# Patient Record
Sex: Female | Born: 1955 | Race: White | Hispanic: No | Smoking: Former smoker
Health system: Southern US, Community
[De-identification: ages and names within clinical notes are randomized; demographics above are authoritative.]

## PROBLEM LIST (undated history)

## (undated) DIAGNOSIS — R5383 Other fatigue: Secondary | ICD-10-CM

## (undated) DIAGNOSIS — E119 Type 2 diabetes mellitus without complications: Secondary | ICD-10-CM

## (undated) DIAGNOSIS — M2550 Pain in unspecified joint: Secondary | ICD-10-CM

## (undated) DIAGNOSIS — F329 Major depressive disorder, single episode, unspecified: Secondary | ICD-10-CM

## (undated) DIAGNOSIS — R0602 Shortness of breath: Secondary | ICD-10-CM

## (undated) DIAGNOSIS — F32A Depression, unspecified: Secondary | ICD-10-CM

## (undated) DIAGNOSIS — F419 Anxiety disorder, unspecified: Secondary | ICD-10-CM

## (undated) DIAGNOSIS — M79606 Pain in leg, unspecified: Secondary | ICD-10-CM

## (undated) DIAGNOSIS — K760 Fatty (change of) liver, not elsewhere classified: Secondary | ICD-10-CM

## (undated) DIAGNOSIS — Z780 Asymptomatic menopausal state: Secondary | ICD-10-CM

## (undated) HISTORY — DX: Depression, unspecified: F32.A

## (undated) HISTORY — DX: Type 2 diabetes mellitus without complications: E11.9

## (undated) HISTORY — DX: Anxiety disorder, unspecified: F41.9

## (undated) HISTORY — DX: Fatty (change of) liver, not elsewhere classified: K76.0

## (undated) HISTORY — DX: Pain in unspecified joint: M25.50

## (undated) HISTORY — DX: Shortness of breath: R06.02

## (undated) HISTORY — DX: Other fatigue: R53.83

## (undated) HISTORY — DX: Major depressive disorder, single episode, unspecified: F32.9

## (undated) HISTORY — DX: Asymptomatic menopausal state: Z78.0

## (undated) HISTORY — DX: Pain in leg, unspecified: M79.606

---
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
## 1991-01-03 HISTORY — PX: TUBAL LIGATION: SHX77

## 2000-09-06 ENCOUNTER — Other Ambulatory Visit: Admission: RE | Admit: 2000-09-06 | Discharge: 2000-09-06 | Payer: Self-pay | Admitting: Family Medicine

## 2003-04-14 ENCOUNTER — Ambulatory Visit (HOSPITAL_COMMUNITY): Admission: RE | Admit: 2003-04-14 | Discharge: 2003-04-14 | Payer: Self-pay | Admitting: Obstetrics & Gynecology

## 2003-07-17 ENCOUNTER — Encounter: Payer: Self-pay | Admitting: Family Medicine

## 2004-06-21 ENCOUNTER — Ambulatory Visit: Payer: Self-pay | Admitting: Family Medicine

## 2004-12-27 ENCOUNTER — Ambulatory Visit: Payer: Self-pay | Admitting: Internal Medicine

## 2005-05-11 IMAGING — US US PELVIS COMPLETE MODIFY
1 series · 14 of 25 positions shown · non-contrast
Comparison: none

CLINICAL DATA: Menorrhagia.  No period from [place] until two weeks ago.  No hormonal replacement therapy.  
TRANSABDOMINAL AND TRANSVAGINAL PELVIC ULTRASOUND 
Transabdominal and endovaginal scanning of the pelvis was performed.  The uterus is enlarged measuring 11.1 cm in length, 6.9 cm in depth, and 7.2 cm in width.  The myometrium is inhomogeneous without measurable fibroids.  The endometrium is indistinct.  The best estimate of a measurement is approximately 9.1 mm.  This raises the question of adenomyosis.
Ovaries are difficult to visualize. What is felt likely to represent the ovary bilaterally is normal on transabdominal scanning.  No adnexal mass or free pelvic fluid is noted.
IMPRESSION
Mildly enlarged inhomogeneous uterus with indistinct endometrium.  Findings would raise a question of adenomyosis.  Pelvic MRI may be helpful for further evaluation if clinically indicated.  
Ovaries difficult to visualize, but felt to be within normal limits on transabdominal scan.

[Series 1: unknown · 0.37mm/px · 14 of 59 slices shown]
[im 1/59]
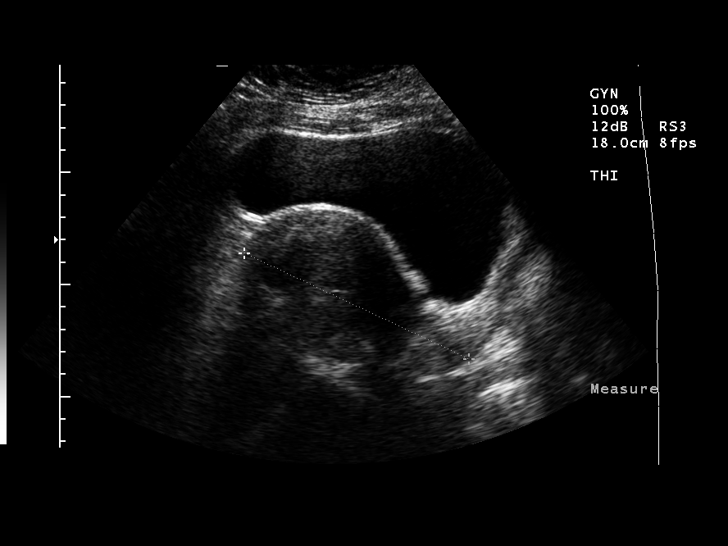
[im 5/59]
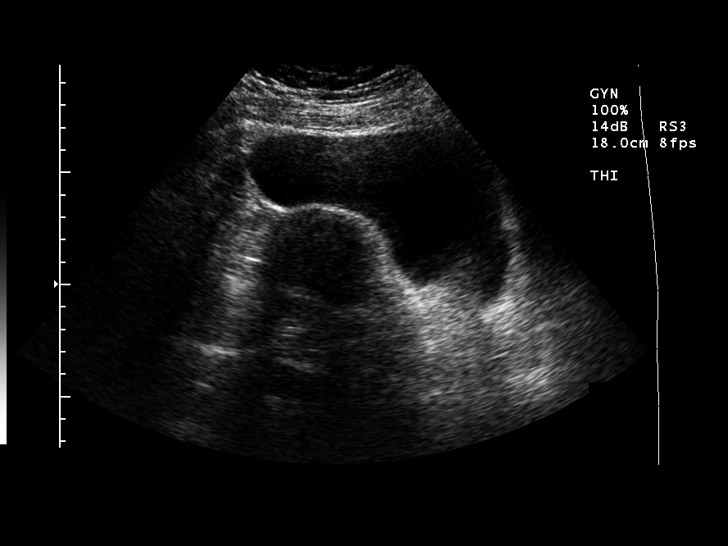
[im 10/59]
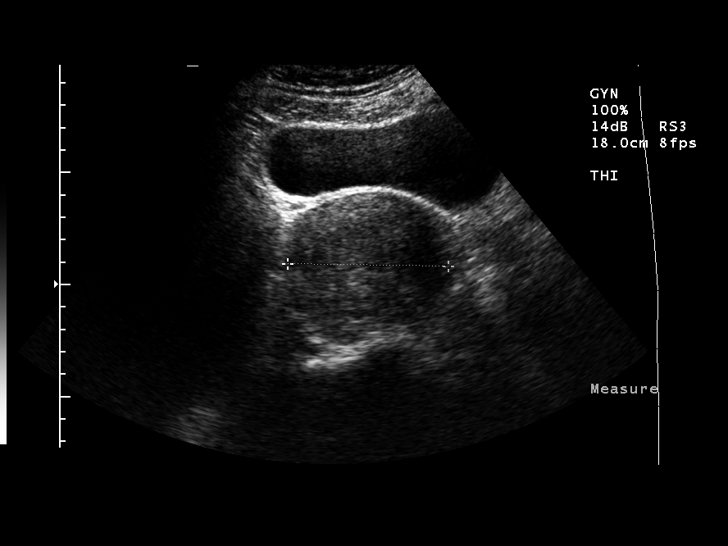
[im 15/59]
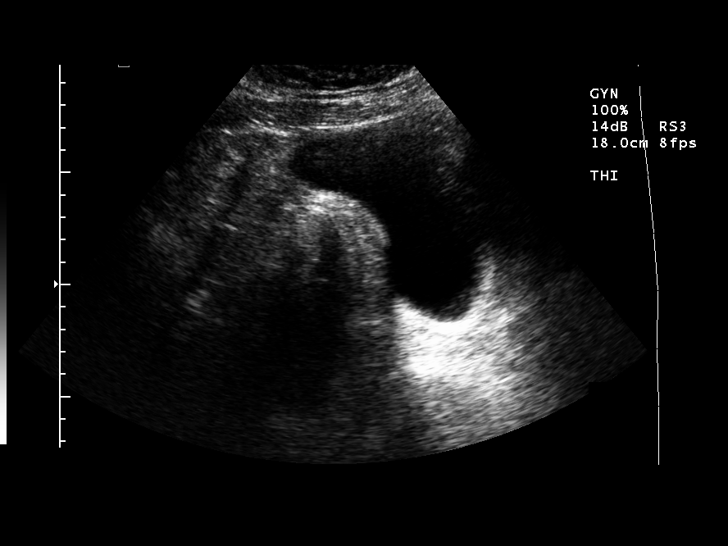
[im 20/59]
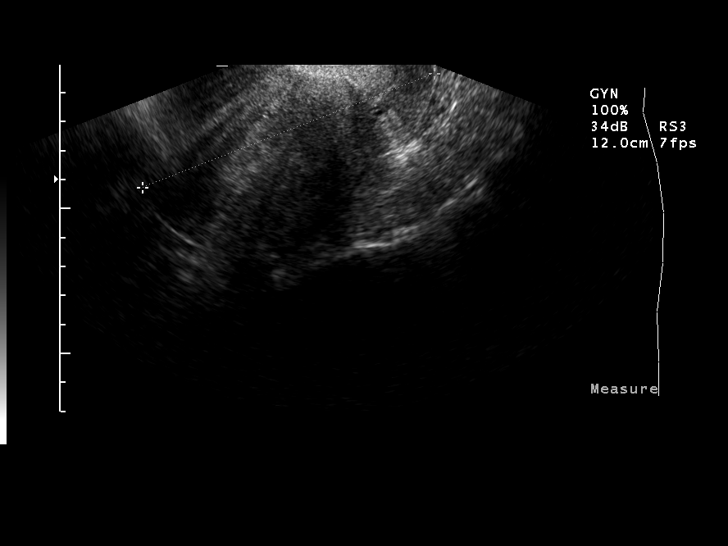
[im 22/59]
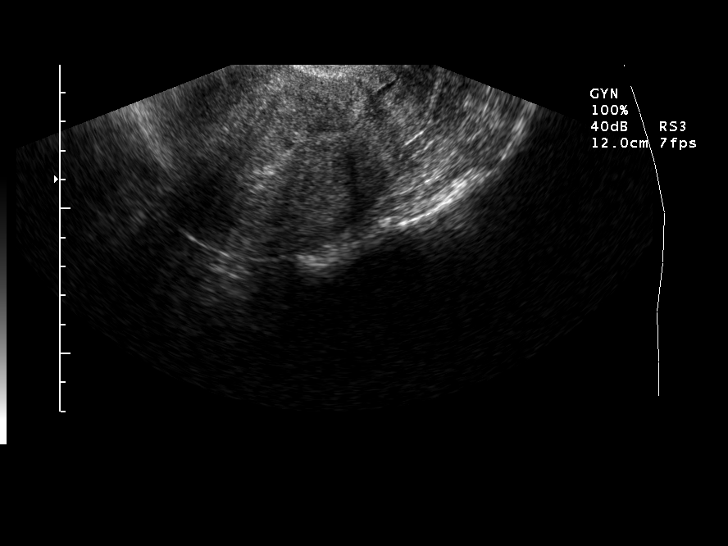
[im 27/59]
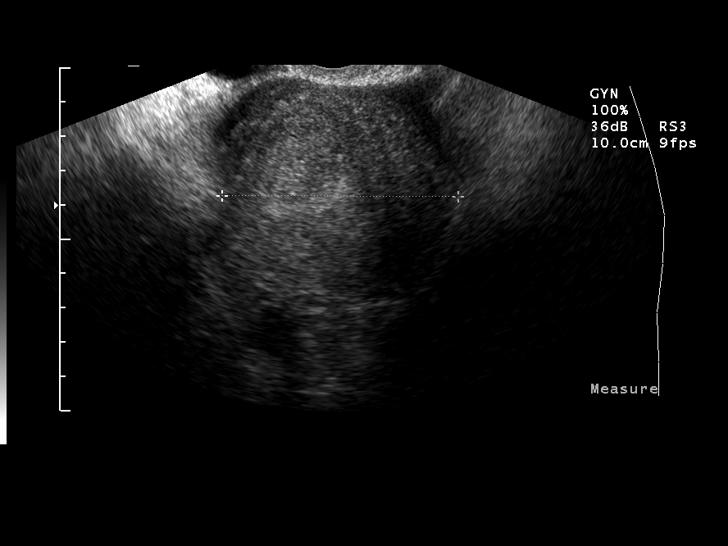
[im 32/59]
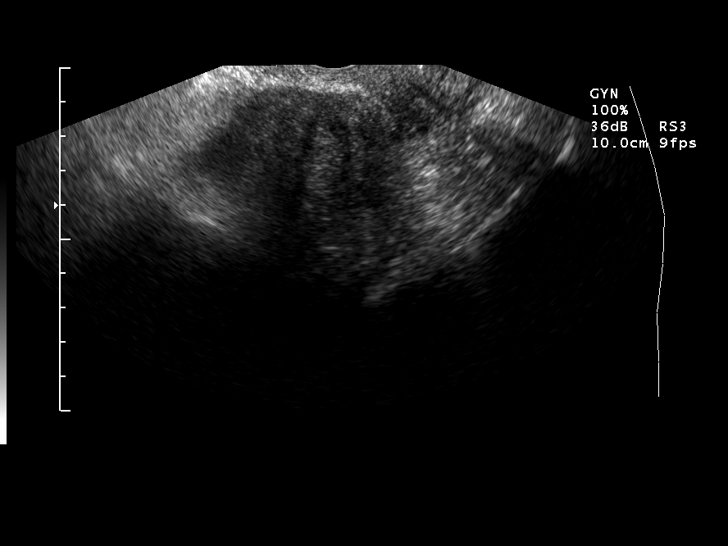
[im 37/59]
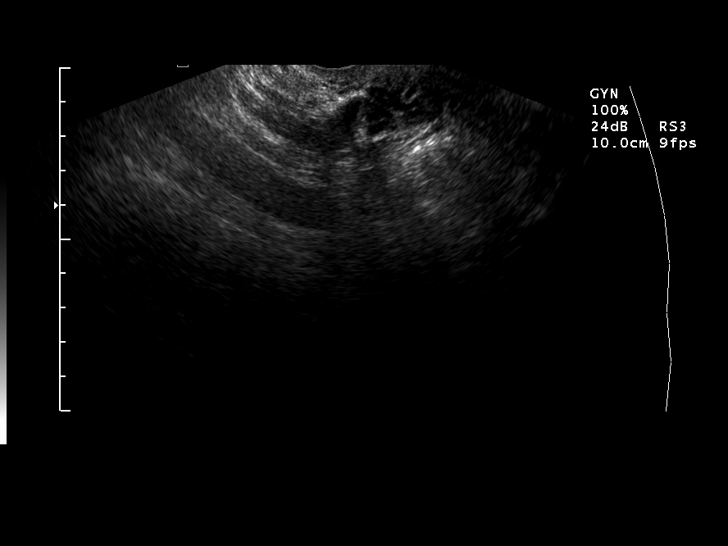
[im 39/59]
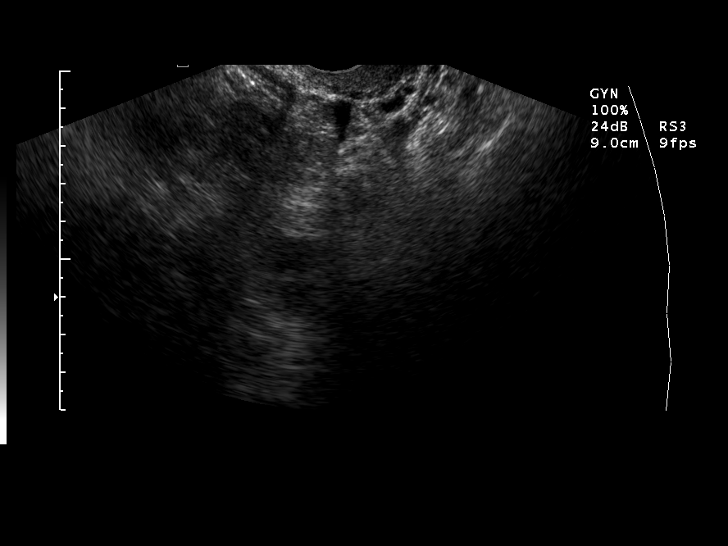
[im 44/59]
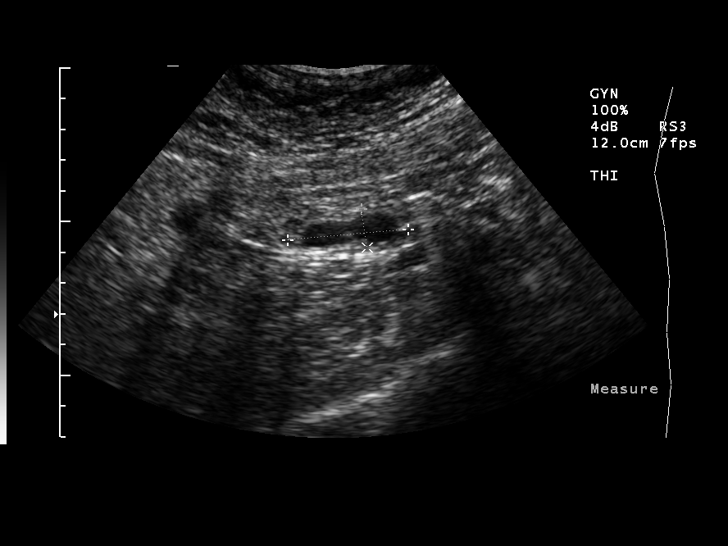
[im 49/59]
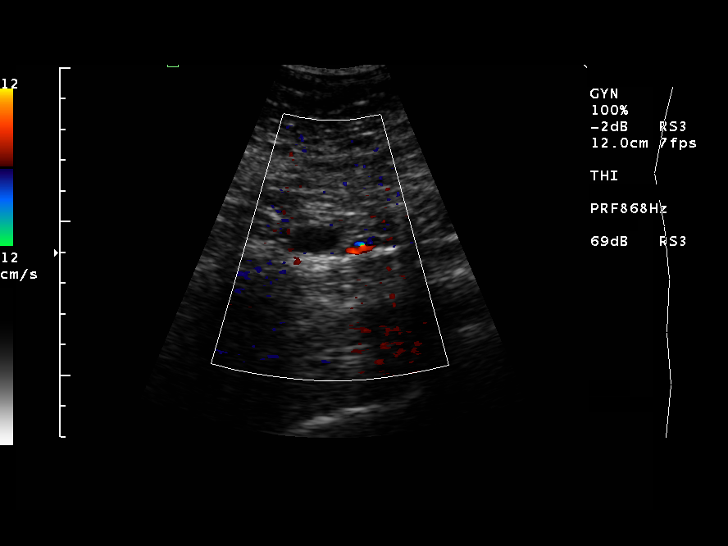
[im 54/59]
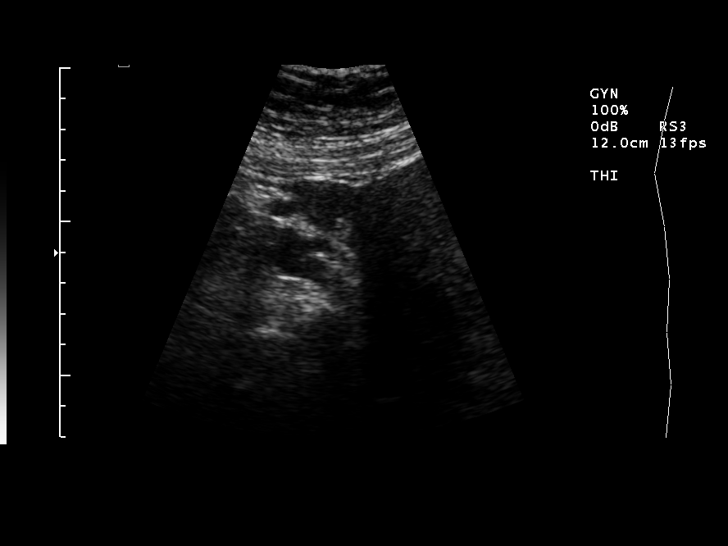
[im 59/59]
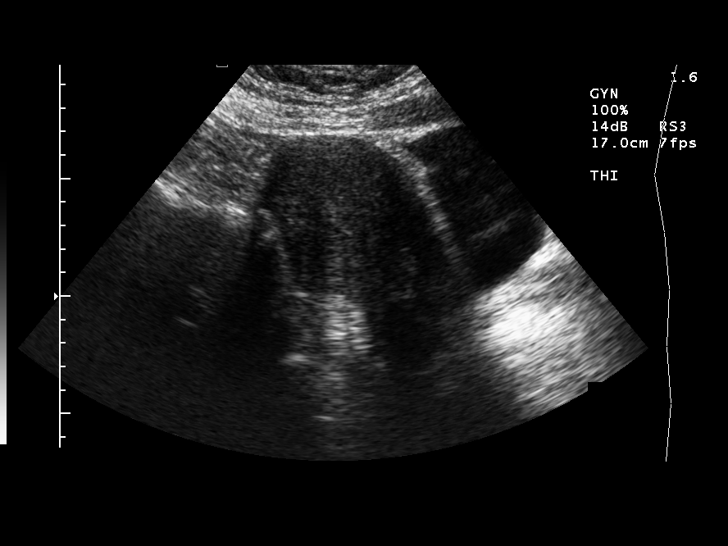

[14 of 25 positions shown; findings below may reference images not displayed]

## 2006-01-18 ENCOUNTER — Ambulatory Visit: Payer: Self-pay | Admitting: Family Medicine

## 2006-03-28 ENCOUNTER — Encounter: Payer: Self-pay | Admitting: Family Medicine

## 2006-03-28 ENCOUNTER — Ambulatory Visit: Payer: Self-pay | Admitting: Family Medicine

## 2006-03-28 ENCOUNTER — Other Ambulatory Visit: Admission: RE | Admit: 2006-03-28 | Discharge: 2006-03-28 | Payer: Self-pay | Admitting: Family Medicine

## 2006-03-28 LAB — CONVERTED CEMR LAB
ALT: 12 units/L (ref 0–40)
Basophils Absolute: 0 10*3/uL (ref 0.0–0.1)
Basophils Relative: 0.3 % (ref 0.0–1.0)
Bilirubin, Direct: 0.1 mg/dL (ref 0.0–0.3)
Calcium: 9 mg/dL (ref 8.4–10.5)
Chloride: 106 meq/L (ref 96–112)
Cholesterol: 184 mg/dL (ref 0–200)
Eosinophils Absolute: 0.1 10*3/uL (ref 0.0–0.6)
Estradiol: 103.5 pg/mL
GFR calc Af Amer: 136 mL/min
GFR calc non Af Amer: 112 mL/min
Glucose, Bld: 97 mg/dL (ref 70–99)
HDL: 38.1 mg/dL — ABNORMAL LOW (ref >39.0)
LH: 15.4 milliintl units/mL
Lymphocytes Relative: 29.8 % (ref 12.0–46.0)
MCHC: 35 g/dL (ref 30.0–36.0)
MCV: 89.9 fL (ref 78.0–100.0)
Monocytes Relative: 4.7 % (ref 3.0–11.0)
Neutro Abs: 7.6 10*3/uL (ref 1.4–7.7)
Sodium: 143 meq/L (ref 135–145)
TSH: 0.75 microintl units/mL (ref 0.35–5.50)
Total Protein: 6.4 g/dL (ref 6.0–8.3)
WBC: 11.8 10*3/uL — ABNORMAL HIGH (ref 4.5–10.5)

## 2006-04-11 ENCOUNTER — Ambulatory Visit (HOSPITAL_COMMUNITY): Admission: RE | Admit: 2006-04-11 | Discharge: 2006-04-11 | Payer: Self-pay | Admitting: Family Medicine

## 2006-06-03 ENCOUNTER — Encounter: Payer: Self-pay | Admitting: Family Medicine

## 2006-06-28 ENCOUNTER — Encounter: Payer: Self-pay | Admitting: Family Medicine

## 2007-08-12 ENCOUNTER — Ambulatory Visit: Payer: Self-pay | Admitting: Family Medicine

## 2007-08-12 DIAGNOSIS — Z87891 Personal history of nicotine dependence: Secondary | ICD-10-CM

## 2007-09-11 ENCOUNTER — Telehealth (INDEPENDENT_AMBULATORY_CARE_PROVIDER_SITE_OTHER): Payer: Self-pay | Admitting: *Deleted

## 2007-10-28 ENCOUNTER — Encounter: Payer: Self-pay | Admitting: Family Medicine

## 2007-10-29 ENCOUNTER — Other Ambulatory Visit: Admission: RE | Admit: 2007-10-29 | Discharge: 2007-10-29 | Payer: Self-pay | Admitting: Family Medicine

## 2007-10-29 ENCOUNTER — Encounter: Payer: Self-pay | Admitting: Family Medicine

## 2007-10-29 ENCOUNTER — Ambulatory Visit: Payer: Self-pay | Admitting: Family Medicine

## 2007-10-29 DIAGNOSIS — Z780 Asymptomatic menopausal state: Secondary | ICD-10-CM | POA: Insufficient documentation

## 2007-11-03 LAB — CONVERTED CEMR LAB
ALT: 14 units/L (ref 0–35)
Albumin: 4.4 g/dL (ref 3.5–5.2)
Alkaline Phosphatase: 81 units/L (ref 39–117)
Basophils Relative: 1.3 % (ref 0.0–3.0)
Bilirubin, Direct: 0.1 mg/dL (ref 0.0–0.3)
CO2: 31 meq/L (ref 19–32)
Chloride: 106 meq/L (ref 96–112)
Creatinine, Ser: 0.7 mg/dL (ref 0.4–1.2)
Eosinophils Absolute: 0.1 10*3/uL (ref 0.0–0.7)
HCT: 40.6 % (ref 36.0–46.0)
Hemoglobin: 14 g/dL (ref 12.0–15.0)
MCHC: 34.5 g/dL (ref 30.0–36.0)
MCV: 92.4 fL (ref 78.0–100.0)
Monocytes Absolute: 1.9 10*3/uL — ABNORMAL HIGH (ref 0.1–1.0)
Neutro Abs: 1.8 10*3/uL (ref 1.4–7.7)
RBC: 4.39 M/uL (ref 3.87–5.11)
RDW: 12.5 % (ref 11.5–14.6)
Sodium: 144 meq/L (ref 135–145)
Total Bilirubin: 0.6 mg/dL (ref 0.3–1.2)
Total Protein: 7.4 g/dL (ref 6.0–8.3)
WBC: 9.5 10*3/uL (ref 4.5–10.5)

## 2007-11-04 ENCOUNTER — Encounter (INDEPENDENT_AMBULATORY_CARE_PROVIDER_SITE_OTHER): Payer: Self-pay | Admitting: *Deleted

## 2008-08-14 ENCOUNTER — Ambulatory Visit: Payer: Self-pay | Admitting: Gastroenterology

## 2008-09-01 ENCOUNTER — Ambulatory Visit: Payer: Self-pay | Admitting: Gastroenterology

## 2008-09-01 ENCOUNTER — Encounter: Payer: Self-pay | Admitting: Gastroenterology

## 2008-09-03 ENCOUNTER — Encounter: Payer: Self-pay | Admitting: Gastroenterology

## 2010-01-23 ENCOUNTER — Encounter: Payer: Self-pay | Admitting: Family Medicine

## 2010-04-08 ENCOUNTER — Encounter: Payer: Self-pay | Admitting: Family Medicine

## 2010-04-12 ENCOUNTER — Ambulatory Visit (INDEPENDENT_AMBULATORY_CARE_PROVIDER_SITE_OTHER): Payer: Managed Care, Other (non HMO) | Admitting: Family Medicine

## 2010-04-12 ENCOUNTER — Encounter: Payer: Self-pay | Admitting: Family Medicine

## 2010-04-12 DIAGNOSIS — E1165 Type 2 diabetes mellitus with hyperglycemia: Secondary | ICD-10-CM | POA: Insufficient documentation

## 2010-04-12 DIAGNOSIS — Z87891 Personal history of nicotine dependence: Secondary | ICD-10-CM

## 2010-04-12 DIAGNOSIS — E119 Type 2 diabetes mellitus without complications: Secondary | ICD-10-CM

## 2010-04-12 MED ORDER — VARENICLINE TARTRATE 0.5 MG X 11 & 1 MG X 42 PO MISC: ORAL | Status: DC

## 2010-04-12 NOTE — Assessment & Plan Note (Signed)
Refer to nutritionist Check BS periodically  Recheck labs 3 months

## 2010-04-12 NOTE — Progress Notes (Signed)
  Subjective:    Patient ID: Vickie Nichols, female    DOB: 02-26-1955, 55 y.o.   MRN: [id number]  HPI Pt here to review labs from Dr Renaldo Fiddler.  Pt is concerned about diabetes.  She is not currently doing anything with diet or exercise.  Pt also wants Chantix to quit smoking.      Review of Systems As above    Objective:   Physical Exam  Constitutional: She appears well-developed and well-nourished.  Skin: Skin is warm and dry.  Psychiatric: She has a normal mood and affect. Judgment normal.          Assessment & Plan:

## 2010-04-12 NOTE — Patient Instructions (Signed)
Diabetes, Type 2 Diabetes is a lasting (chronic) disease. In type 2 diabetes, the pancreas does not make enough insulin (a hormone), and the body does not respond normally to the insulin that is made. This type of diabetes was also previously called adult onset diabetes. About 90% of all those who have diabetes have type 2. It usually occurs after the age of 30 but can occur at any age. CAUSES Unlike type 1 diabetes, which happens because insulin is no longer being made, type 2 diabetes happens because the body is making less insulin and has trouble using the insulin properly. SYMPTOMS  Drinking more than usual.   Urinating more than usual.   Blurred vision.   Dry, itchy skin.   Frequent infection like yeast infections in women.   More tired than usual (fatigue).  TREATMENT  Healthy eating.   Exercise.   Medication, if needed.   Monitoring blood glucose (sugar).   Seeing your caregiver regularly.  HOME CARE INSTRUCTIONS  Check your blood glucose (sugar) at least once daily. More frequent monitoring may be necessary, depending on your medications and on how well your diabetes is controlled. Your caregiver will advise you.   Take your medicine as directed by your caregiver.   Do not smoke.   Make wise food choices. Ask your caregiver for information. Weight loss can improve your diabetes.   Learn about low blood glucose (hypoglycemia) and how to treat it.   Get your eyes checked regularly.   Have a yearly physical exam. Have your blood pressure checked. Get your blood and urine tested.   Wear a pendant or bracelet saying that you have diabetes.   Check your feet every night for sores. Let your caregiver know if you have sores that are not healing.  SEEK MEDICAL CARE IF:  You are having problems keeping your blood glucose at target range.   You feel you might be having problems with your medicines.   You have symptoms of an illness that is not improving after 24  hours.   You have a sore or wound that is not healing.   You notice a change in vision or a new problem with your vision.   You develop a fever of more than 100.1.  Document Released: 12/19/2004 Document Re-Released: 01/10/2009 [facility] Patient Information 2011 [place], [place].Diabetes Meal Planning Guide The diabetes meal planning guide is a tool to help you plan your meals and snacks. It is important for people with diabetes to manage their blood sugar levels. Choosing the right foods and the right amounts throughout your day will help control your blood sugar. Eating right can even help you improve your blood pressure and reach or maintain a healthy weight. CARBOHYDRATE COUNTING MADE EASY When you eat carbohydrates, they turn to sugar (glucose). This raises your blood sugar level. Counting carbohydrates can help you control this level so you feel better. When you plan your meals by counting carbohydrates, you can have more flexibility in what you eat and balance your medicine with your food intake. Carbohydrate counting simply means adding up the total amount of carbohydrate grams (g) in your meals or snacks. Try to eat about the same amount at each meal. Foods with carbohydrates are listed below. Each portion below is 1 carbohydrate serving or 15 grams of carbohydrates. Ask your dietician how many grams of carbohydrates you should eat at each meal or snack. Grains and Starches 1 slice bread 1/2 English muffin or hotdog/hamburger bun 3/4 cup cold cereal (  unsweetened) 1/3 cup cooked pasta or rice 1/2 cup starchy vegetables (corn, potatoes, peas, beans, winter squash) 1 tortilla (6 inches) 1/4 bagel 1 waffle or pancake (size of a CD) 1/2 cup cooked cereal 4 to 6 small crackers *Whole grain is recommended Fruit 1 cup fresh unsweetened berries, melon, papaya, pineapple 1 small fresh fruit 1/2 banana or mango 1/2 cup fruit juice (4 ounces unsweetened) 1/2 cup canned fruit in natural juice  or water 2 tablespoons dried fruit 12 to 15 grapes or cherries Milk and Yogurt 1 cup fat-free or 1% milk 1 cup soy milk 6 ounces light yogurt with sugar-free sweetener 6 ounces low-fat soy yogurt 6 ounces plain yogurt Vegetables 1 cup raw or 1/2 cup cooked is counted as 0 carbohydrates or a "free" food. If you eat 3 or more servings at one meal, count them as 1 carbohydrate serving. Other Carbohydrates 3/4 ounces chips or pretzels 1/2 cup ice cream or frozen yogurt 1/4 cup sherbet or sorbet 2 inch square cake, no frosting 1 tablespoon honey, sugar, jam, jelly, or syrup 2 small cookies 3 squares of graham crackers 3 cups popcorn 6 crackers 1 cup broth-based soup Count 1 cup casserole or other mixed foods as 2 carbohydrate servings. Foods with less than 20 calories in a serving may be counted as 0 carbohydrates or a "free" food. You may want to purchase a book or computer software that lists the carbohydrate gram counts of different foods. In addition, the nutrition facts panel on the labels of the foods you eat are a good source of this information. The label will tell you how big the serving size is and the total number of carbohydrate grams you will be eating per serving. Divide this number by 15 to obtain the number of carbohydrate servings in a portion. Remember: 1 carbohydrate serving equals 15 grams of carbohydrate. SERVING SIZES Measuring foods and serving sizes helps you make sure you are getting the right amount of food. The list below tells how big or small some common serving sizes are.  1 ounce (oz) of cheese.................................4 stacked dice.   2 to 3 oz cooked meat.................................Marland KitchenDeck of cards.   1 teaspoon (tsp)...........................................Marland KitchenTip of little finger.   1 tablespoon (tbs).......................................Marland KitchenMarland KitchenThumb.   2 tbs............................................................Marland KitchenGolf ball.     cup..........................................................Marland KitchenHalf of a fist.   1 cup...........................................................Marland KitchenA fist.  SAMPLE DIABETES MEAL PLAN Below is a sample meal plan that includes foods from the grain and starches, dairy, vegetable, fruit, and meat groups. A dietician can individualize a meal plan to fit your calorie needs and tell you the number of servings needed from each food group. However, controlling the total amount of carbohydrates in your meal or snack is more important than making sure you include all of the food groups at every meal. You may interchange carbohydrate containing foods (dairy, starches, and fruits). The meal plan below is an example of a 2000 calorie diet using carbohydrate counting. This meal plan has 17 carbohydrate servings (carb choices). Breakfast 1 cup oatmeal (2 carb choices) 3/4 cup light yogurt (1 carb choice) 1 cup blueberries (1 carb choice) 1/4 cup almonds  Snack 1 large apple (2 carb choices) 1 low-fat string cheese stick  Lunch Chicken breast salad:  1 cup spinach   1/4 cup chopped tomatoes   2 oz chicken breast, sliced   2 tbs low-fat [place] dressing  12 whole-wheat crackers (2 carb choices) 12 to 15 grapes (1 carb choice) 1 cup low-fat milk (1 carb choice)  Snack 1  cup carrots 1/2 cup hummus (1 carb choice)  Dinner 3 oz broiled salmon 1 cup brown rice (3 carb choices)  Snack 1 1/2 cups steamed broccoli (1 carb choice) drizzled with 1 tsp olive oil and lemon juice 1 cup light pudding (2 carb choices)  DIABETES MEAL PLANNING WORKSHEET Your dietician can use this worksheet to help you decide how many servings of foods and what types of foods are right for you.  Breakfast Food Group and Servings Carb Choices Grain/Starches _______________________________________ Dairy ______________________________________________ Vegetable _______________________________________ Fruit  _______________________________________________ Meat _______________________________________________ Fat _____________________________________________ Lunch Food Group and Servings Carb Choices Grain/Starches ________________________________________ Dairy _______________________________________________ Fruit ________________________________________________ Meat ________________________________________________ Fat _____________________________________________ Dinner Food Group and Servings Carb Choices Grain/Starches ________________________________________ Dairy _______________________________________________ Fruit ________________________________________________ Meat ________________________________________________ Fat _____________________________________________ Snacks Food Group and Servings Carb Choices Grain/Starches ________________________________________ Dairy _______________________________________________ Vegetable ________________________________________ Fruit ________________________________________________ Meat ________________________________________________ Fat _____________________________________________ Daily Totals Starches _________________________ Vegetable __________________________ Fruit ______________________________ Dairy ______________________________ Meat ______________________________ Fat ________________________________  Document Released: 09/15/2004 Document Re-Released: 06/08/2009 [employer] Patient Information 2011 [place], [place].

## 2010-04-12 NOTE — Assessment & Plan Note (Signed)
chantix

## 2010-04-14 ENCOUNTER — Encounter: Payer: Self-pay | Admitting: Family Medicine

## 2010-04-19 ENCOUNTER — Encounter: Payer: Self-pay | Admitting: Family Medicine

## 2010-04-29 ENCOUNTER — Encounter: Payer: Managed Care, Other (non HMO) | Attending: Family Medicine | Admitting: Dietician

## 2010-04-29 DIAGNOSIS — E119 Type 2 diabetes mellitus without complications: Secondary | ICD-10-CM | POA: Insufficient documentation

## 2010-04-29 DIAGNOSIS — Z713 Dietary counseling and surveillance: Secondary | ICD-10-CM | POA: Insufficient documentation

## 2010-05-31 ENCOUNTER — Encounter: Payer: Self-pay | Admitting: Family Medicine

## 2010-05-31 ENCOUNTER — Ambulatory Visit (INDEPENDENT_AMBULATORY_CARE_PROVIDER_SITE_OTHER): Payer: Managed Care, Other (non HMO) | Admitting: Family Medicine

## 2010-05-31 VITALS — BP 122/68 | HR 70 | Temp 99.1°F | Wt 229.0 lb

## 2010-05-31 DIAGNOSIS — F419 Anxiety disorder, unspecified: Secondary | ICD-10-CM

## 2010-05-31 DIAGNOSIS — F329 Major depressive disorder, single episode, unspecified: Secondary | ICD-10-CM

## 2010-05-31 DIAGNOSIS — F411 Generalized anxiety disorder: Secondary | ICD-10-CM

## 2010-05-31 MED ORDER — LORAZEPAM 0.5 MG PO TABS: 0.5000 mg | ORAL_TABLET | Freq: Three times a day (TID) | ORAL | Status: DC | PRN

## 2010-05-31 MED ORDER — CITALOPRAM HYDROBROMIDE 20 MG PO TABS: 20.0000 mg | ORAL_TABLET | Freq: Every day | ORAL | Status: DC

## 2010-05-31 NOTE — Patient Instructions (Addendum)
Depression, Adolescent and Adult Depression is a true and treatable medical condition. In general there are two kinds of depression:  Depression we all experience in some form. For example depression from the death of a loved one, financial distress or natural disasters will trigger or increase depression.   Clinical depression, on the other hand, appears without an apparent cause or reason. This depression is a disease. Depression may be caused by chemical imbalance in the body and brain or may come as a response to a physical illness. Alcohol and other drugs can cause depression.  DIAGNOSIS  The diagnosis of depression is usually based upon symptoms and medical history. TREATMENT Treatments for depression fall into three categories. These are:  Drug therapy. There are many medicines that treat depression. Responses may vary and sometimes trial and error is necessary to determine the best medicines and dosage for a particular patient.   Psychotherapy, also called talking treatments, helps people resolve their problems by looking at them from a different point of view and by giving people insight into their own personal makeup. Traditional psychotherapy looks at a childhood source of a problem. Other psychotherapy will look at current conflicts and move toward solving those. If the cause of depression is drug use, counseling is available to help abstain. In time the depression will usually improve. If there were underlying causes for the chemical use, they can be addressed.   ECT (electroconvulsive therapy) or shock treatment is not as commonly used today. It is a very effective treatment for severe suicidal depression. During ECT electrical impulses are applied to the head. These impulses cause a generalized seizure. It can be effective but causes a loss of memory for recent events. Sometimes this loss of memory may include the last several months.  Treat all depression or suicide threats as  serious. Obtain professional help. Do not wait to see if serious depression will get better over time without help. Seek help for yourself or those around you. In the [place] the number to the National Suicide Help Lines With 24 Hour Help Are: 1-800-SUICIDE 305-218-6343 Document Released: 12/17/1999 Document Re-Released: 03/17/2008 [facility] Patient Information 2011 [place], [place].   Take celexa 20 mg  1/2 po qd for 8 days then increase to 1 po qd  #30  2 refills  Call counselor for appointment

## 2010-06-01 ENCOUNTER — Encounter: Payer: Self-pay | Admitting: Family Medicine

## 2010-06-01 NOTE — Progress Notes (Signed)
  Subjective:    Patient ID: Vickie Nichols, female    DOB: [birth year], 55 y.o.   MRN: [id number]  HPI Pt here c/o anxiety / depression.  Her husband died 1 week ago in a motorcycle accident.  Pt can't stop crying and has isolated herself from everyone.   Review of Systems     Objective:   Physical Exam        Assessment & Plan:   Subjective:   Vickie Nichols is an 55 y.o. female who presents for evaluation and treatment of depressive symptoms.  Onset approximately 1 week ago, gradually worsening since that time.  Current symptoms include depressed mood, hypersomnia, fatigue, feelings of worthlessness/guilt, hopelessness, anxiety and loss of energy/fatigue.  Current treatment for depression:None Sleep problems: Mild   Early awakening:Moderate   Energy: Poor Motivation: Poor Concentration: Poor Rumination/worrying: Marked Memory: Good Tearfulness: Severe  Anxiety: Marked  Panic: Absent  Overall Mood: Severely worse  Hopelessness: Severe Suicidal ideation: Absent  Other/Psychosocial Stressors: her daughter leaving for summer and school Family history positive for depression in the patient's unknown.  Previous treatment modalities employed include Medication.  Past episodes of depression:yes Organic causes of depression present: None.  Review of Systems Pertinent items are noted in HPI.   Objective:   Mental Status Examination: Posture and motor behavior: Appropriate Dress, grooming, personal hygiene: Appropriate Facial expression: Appropriate Speech: Appropriate Mood: Appropriate Coherency and relevance of thought: Appropriate Thought content: Appropriate Perceptions: Appropriate Orientation:Appropriate Attention and concentration: Appropriate Memory: : Appropriate Information: Not examined Vocabulary: Appropriate Abstract reasoning: Appropriate Judgment: Appropriate    Assessment:   Experiencing the following symptoms of depression most of the day  nearly every day for more than two consecutive weeks: depressed mood  Depressive Disorder:with anxiety  Suicide Risk Assessment:  Suicidal intent: no Suicidal plan: no Access to means for suicide: no Lethality of means for suicide: no Prior suicide attempts: no Recent exposure to suicide:no   Plan:    1. Anxiety  citalopram (CELEXA) 20 MG tablet, LORazepam (ATIVAN) 0.5 MG tablet  2. Depression      Reviewed concept of depression as biochemical imbalance of neurotransmitters and rationale for treatment. Instructed patient to contact office or on-call physician promptly should condition worsen or any new symptoms appear and provided on-call telephone numbers.

## 2010-06-15 ENCOUNTER — Ambulatory Visit (INDEPENDENT_AMBULATORY_CARE_PROVIDER_SITE_OTHER): Payer: Managed Care, Other (non HMO) | Admitting: Family Medicine

## 2010-06-15 ENCOUNTER — Encounter: Payer: Self-pay | Admitting: Family Medicine

## 2010-06-15 VITALS — BP 130/72 | HR 67 | Temp 98.6°F | Wt 230.4 lb

## 2010-06-15 DIAGNOSIS — F418 Other specified anxiety disorders: Secondary | ICD-10-CM

## 2010-06-15 DIAGNOSIS — F341 Dysthymic disorder: Secondary | ICD-10-CM

## 2010-06-15 MED ORDER — CITALOPRAM HYDROBROMIDE 40 MG PO TABS: 40.0000 mg | ORAL_TABLET | Freq: Every day | ORAL | Status: DC

## 2010-06-15 NOTE — Progress Notes (Signed)
  Subjective:   Vickie Nichols is an 55 y.o. female who presents for evaluation and treatment of depressive symptoms.  Onset approximately 2 months ago, gradually improving since that time.  Current symptoms include depressed mood, difficulty concentrating, anxiety, panic attacks and loss of energy/fatigue.  Current treatment for depression:Medication Sleep problems: Mild   Early awakening:Absent   Energy: Poor Motivation: Poor Concentration: Poor Rumination/worrying: Moderate Memory: Good Tearfulness: Moderate  Anxiety: Mild  Panic: Absent  Overall Mood: Minimally improved  Hopelessness: Mild Suicidal ideation: Absent  Other/Psychosocial Stressors: husbands death and worrying about her daughter Family history positive for depression in the patient's unknown.  Previous treatment modalities employed include None.  Past episodes of depression:--- Organic causes of depression present: None.  Review of Systems Pertinent items are noted in HPI.   Objective:   Mental Status Examination: Posture and motor behavior: Appropriate Dress, grooming, personal hygiene: Appropriate Facial expression: Appropriate Speech: Appropriate Mood: Appropriate Coherency and relevance of thought: Appropriate Thought content: Appropriate Perceptions: Appropriate Orientation:Appropriate Attention and concentration: Appropriate Memory: : Appropriate Information: Not examined Vocabulary: Appropriate Abstract reasoning: Appropriate Judgment: Appropriate    Assessment:   Experiencing the following symptoms of depression most of the day nearly every day for more than two consecutive weeks: depressed mood, loss of interests/pleasure, loss of energy, trouble concentrating  Depressive Disorder: with anxiety  Suicide Risk Assessment:  Suicidal intent: no Suicidal plan: no Access to means for suicide: no Lethality of means for suicide: no Prior suicide attempts: no Recent exposure to suicide:no     Plan:    1. Depression with anxiety  citalopram (CELEXA) 40 MG tablet  ativan prn only rto 4-6 weeks or sooner prn  Reviewed concept of depression as biochemical imbalance of neurotransmitters and rationale for treatment. Instructed patient to contact office or on-call physician promptly should condition worsen or any new symptoms appear and provided on-call telephone numbers.

## 2010-07-08 ENCOUNTER — Other Ambulatory Visit: Payer: Self-pay | Admitting: Family Medicine

## 2010-07-08 DIAGNOSIS — F419 Anxiety disorder, unspecified: Secondary | ICD-10-CM

## 2010-07-08 MED ORDER — LORAZEPAM 0.5 MG PO TABS: 0.5000 mg | ORAL_TABLET | Freq: Three times a day (TID) | ORAL | Status: AC | PRN

## 2010-07-08 NOTE — Telephone Encounter (Signed)
Last seen 06/15/10 and filled 05/31/10 please advise    KP

## 2010-07-08 NOTE — Telephone Encounter (Signed)
Refill x1 

## 2010-07-08 NOTE — Telephone Encounter (Signed)
Faxed     Kp 

## 2010-07-13 ENCOUNTER — Ambulatory Visit: Payer: Managed Care, Other (non HMO) | Admitting: Family Medicine

## 2010-07-20 ENCOUNTER — Encounter: Payer: Self-pay | Admitting: Family Medicine

## 2010-07-20 ENCOUNTER — Ambulatory Visit (INDEPENDENT_AMBULATORY_CARE_PROVIDER_SITE_OTHER): Payer: Managed Care, Other (non HMO) | Admitting: Family Medicine

## 2010-07-20 VITALS — BP 112/72 | HR 59 | Temp 98.9°F | Wt 234.6 lb

## 2010-07-20 DIAGNOSIS — F411 Generalized anxiety disorder: Secondary | ICD-10-CM

## 2010-07-20 DIAGNOSIS — F341 Dysthymic disorder: Secondary | ICD-10-CM

## 2010-07-20 DIAGNOSIS — R232 Flushing: Secondary | ICD-10-CM

## 2010-07-20 DIAGNOSIS — F419 Anxiety disorder, unspecified: Secondary | ICD-10-CM

## 2010-07-20 DIAGNOSIS — E119 Type 2 diabetes mellitus without complications: Secondary | ICD-10-CM

## 2010-07-20 DIAGNOSIS — F418 Other specified anxiety disorders: Secondary | ICD-10-CM

## 2010-07-20 DIAGNOSIS — N951 Menopausal and female climacteric states: Secondary | ICD-10-CM | POA: Insufficient documentation

## 2010-07-20 LAB — BASIC METABOLIC PANEL
BUN: 9 mg/dL (ref 6–23)
Calcium: 9.1 mg/dL (ref 8.4–10.5)
GFR: 92.36 mL/min (ref >60.00)
Glucose, Bld: 103 mg/dL — ABNORMAL HIGH (ref 70–99)
Potassium: 4.7 mEq/L (ref 3.5–5.1)

## 2010-07-20 LAB — LIPID PANEL
Cholesterol: 180 mg/dL (ref 0–200)
HDL: 40.2 mg/dL (ref >39.00)
LDL Cholesterol: 113 mg/dL — ABNORMAL HIGH (ref 0–99)
Total CHOL/HDL Ratio: 4
Triglycerides: 134 mg/dL (ref 0.0–149.0)

## 2010-07-20 LAB — HEPATIC FUNCTION PANEL
AST: 21 U/L (ref 0–37)
Alkaline Phosphatase: 81 U/L (ref 39–117)

## 2010-07-20 LAB — HEMOGLOBIN A1C: Hgb A1c MFr Bld: 6.5 % (ref 4.6–6.5)

## 2010-07-20 LAB — MICROALBUMIN / CREATININE URINE RATIO
Creatinine,U: 132.4 mg/dL
Microalb, Ur: 0.5 mg/dL (ref 0.0–1.9)

## 2010-07-20 MED ORDER — ESTRADIOL-NORETHINDRONE ACET 0.05-0.14 MG/DAY TD PTTW: 1.0000 | MEDICATED_PATCH | TRANSDERMAL | Status: DC

## 2010-07-20 NOTE — Progress Notes (Signed)
Addended by: Doristine Devoid on: 07/20/2010 04:16 PM   Modules accepted: Orders

## 2010-07-20 NOTE — Assessment & Plan Note (Signed)
Diet controlled. Check labs.  

## 2010-07-20 NOTE — Assessment & Plan Note (Signed)
combipatch 2x a week

## 2010-07-20 NOTE — Patient Instructions (Signed)
Hormone Replacement Therapy HRT, ERT & HT At menopause, your body begins making less estrogen and progesterone hormones. This causes the body to stop having menstrual periods. This is because estrogen and progesterone hormones control your periods and menstrual cycle. A lack of estrogen may cause symptoms such as:  Hot flushes (or hot flashes).   Vaginal dryness.   Dry skin.   Loss of sex drive.   Risk of bone loss (osteoporosis).  When this happens, you may choose to take hormone therapy to get back the estrogen lost during menopause. When the hormone estrogen is given alone, it is usually referred to as ERT (Estrogen Replacement Therapy). When the hormone progestin is combined with estrogen, it is generally called HT (Hormone Therapy). This was formerly known as hormone replacement therapy (HRT). Your caregiver can help you make a decision on what will be best for you. The decision on using HRT seems to change often as new studies are done. Many studies do not agree on the benefits of hormone replacement therapy. LIKELY BENEFITS OF HRT INCLUDE PROTECTION FROM:  Hot Flushes (also called hot flashes) - A hot flush is a sudden feeling of heat that spreads over the face and body. The skin may redden like a blush. It is connected with sweats and sleep disturbance. Women going through menopause may have hot flushes a few times a month or several times per day depending on the woman.   Osteoporosis (bone loss)- Estrogen helps guard against bone loss. After menopause, a woman's bones slowly lose calcium and become weak and brittle. As a result, bones are more likely to break. The hip, wrist, and spine are affected most often. Hormone therapy can help slow bone loss after menopause. Weight bearing exercise and taking calcium with vitamin D also can help prevent bone loss. There are also medications that your caregiver can prescribe that can help prevent osteoporosis.   Vaginal Dryness - Loss of estrogen  causes changes in the vagina. Its lining may become thin and dry. These changes can cause pain and bleeding during sexual intercourse. Dryness can also lead to infections. This can cause burning and itching.   Urinary Tract Infections are more common after menopause because of lack of estrogen.   Possible other benefits of estrogen include a positive effect on mood and short-term memory in women.  SIDE EFFECTS AND RISKS  Using estrogen alone without progesterone causes the lining of the uterus to grow. This increases the risk of endometrial cancer. Your caregiver should give another hormone called progestin if you have a uterus.   Women who take combined (estrogen and progestin) HT appear to have an increased risk of breast cancer. The risk appears to be small, but increases throughout the time that HT is taken.   Combined therapy also makes the breast tissue slightly denser which makes it harder to read mammograms (breast x-rays).   Combined, estrogen and progesterone therapy can be taken together every day, in which case there may be spotting of blood. HT therapy can be taken cyclically in which case you will have menstrual periods.   HT may increase the risk of stroke, heart attack, breast cancer and forming blood clots in your leg.  TREATMENT  If you choose to take HT and have a uterus, usually estrogen and progestin are prescribed.   Your caregiver will help you decide which is the best way to take the medications.   It is best to take the lowest dose possible that will help   your symptoms and take them for the shortest period of time that you can.   Hormone therapy can help relieve some of the problems (symptoms) that affect women at menopause. Before making a decision about HT, talk to your caregiver about what is best for you. Be well informed and comfortable with your decisions.  HOME CARE INSTRUCTIONS  Follow your caregivers advice when taking the medications.  A PAP smear is  done to screen for cervical cancer.   The first PAP smear should be done at age 21.   Between ages 21 and 29, PAP smears are repeated every 2 years.   Beginning at age 30, you are advised to have a PAP smear every 3 years as long as your past 3 PAP smears have been normal.   Some women have medical problems that increase the chance of getting cervical cancer. Talk to your caregiver about these problems. It is especially important to talk to your caregiver if a new problem develops soon after your last PAP smear. In these cases, your caregiver may recommend more frequent screening and Pap smears.   The above recommendations are the same for women who have or have not gotten the vaccine for HPV (Human Papillomavirus).   If you had a hysterectomy for a problem that was not a cancer or a condition that could lead to cancer, then you no longer need Pap smears.   If you are between ages 65 and 70, and you have had normal Pap smears going back 10 years, you no longer need Pap smears.   If you have had past treatment for cervical cancer or a condition that could lead to cancer, you need Pap smears and screening for cancer for at least 20 years after your treatment.   Get mammograms done as per the advice of your caregiver.  WHEN ON HRT, SEEK IMMEDIATE MEDICAL CARE IF YOU DEVELOP:  Abnormal vaginal bleeding.   Pain or swelling in your legs, shortness of breath or chest pain.   Dizziness or headaches.   Lumps or changes in your breasts or arm pits.   Slurred speech.   Weakness or numbness of your arms or legs.   Pain, burning or bleeding when urinating.   Abdominal pain.  Recommendations on replacement hormone therapy keep changing. You can get the latest information by contacting: [employer] Menopause Society Phone: [phone]) [phone] Internet Address: http://www.menopause.org/ The Horm[employer] Foundation Phone: (800) 467-6663 Internet Address: http://www.hormone.org/ American College of  Obstetricians and Gynecologists Phone: (202) 863-2518 Internet Address: http://www.acog.org/ Women's Health Initiative (1-800-54-WOMEN) Phone: (301) 402-2900 Internet Address: http://www.nhlbi.nih.gov/whi/index.html  Document Released: 09/17/2002 Document Re-Released: 03/15/2009 [employer] Patient Information 2011 [employer]. 

## 2010-07-20 NOTE — Assessment & Plan Note (Signed)
Doing well with meds rto 3 months or sooner prn

## 2010-07-20 NOTE — Progress Notes (Signed)
  Subjective:    Patient ID: Vickie Nichols, female    DOB: 08/13/1955, 55 y.o.   MRN: [id number]  HPI Pt here for f/u anxiety / depression---she is doing well with med.  [place].  She is a little concerned because her daughter is leaving for school in a month and then she'll be alone but right now things are good. Pt is having hot flashes and those are bothering her a lot.  Her sister was just put on a patch and is doing well with it.  She is requesting this as well.   Review of Systems As directed    Objective:   Physical Exam  Constitutional: She is oriented to person, place, and time. She appears well-developed and well-nourished.  Cardiovascular: Normal rate, regular rhythm and normal heart sounds.   Pulmonary/Chest: Effort normal and breath sounds normal.  Neurological: She is alert and oriented to person, place, and time.  Psychiatric: She has a normal mood and affect. Her behavior is normal. Judgment and thought content normal.          Assessment & Plan:

## 2010-08-15 ENCOUNTER — Other Ambulatory Visit: Payer: Self-pay | Admitting: Family Medicine

## 2010-08-15 NOTE — Telephone Encounter (Signed)
Pt should still have refills.  Left message on pharmacy voicemail about this.

## 2010-08-17 NOTE — Telephone Encounter (Signed)
Pharmacy called back wanting to know if it was ok to fill med for 3 month supply, Pharmacy given verbal ok to fill med for 3 month supply.

## 2010-09-22 ENCOUNTER — Ambulatory Visit (HOSPITAL_BASED_OUTPATIENT_CLINIC_OR_DEPARTMENT_OTHER)
Admission: RE | Admit: 2010-09-22 | Discharge: 2010-09-22 | Disposition: A | Discharge status: Home / Self Care | Payer: Managed Care, Other (non HMO) | Source: Ambulatory Visit | Attending: Family Medicine | Admitting: Family Medicine

## 2010-09-22 ENCOUNTER — Ambulatory Visit (INDEPENDENT_AMBULATORY_CARE_PROVIDER_SITE_OTHER): Payer: Managed Care, Other (non HMO) | Admitting: Family Medicine

## 2010-09-22 ENCOUNTER — Encounter: Payer: Self-pay | Admitting: Family Medicine

## 2010-09-22 VITALS — BP 138/82 | Temp 100.9°F | Wt 236.0 lb

## 2010-09-22 DIAGNOSIS — J40 Bronchitis, not specified as acute or chronic: Secondary | ICD-10-CM

## 2010-09-22 DIAGNOSIS — R05 Cough: Secondary | ICD-10-CM | POA: Insufficient documentation

## 2010-09-22 DIAGNOSIS — R059 Cough, unspecified: Secondary | ICD-10-CM | POA: Insufficient documentation

## 2010-09-22 DIAGNOSIS — Z780 Asymptomatic menopausal state: Secondary | ICD-10-CM

## 2010-09-22 MED ORDER — ESTRADIOL-NORETHINDRONE ACET 0.05-0.25 MG/DAY TD PTTW: 1.0000 | MEDICATED_PATCH | TRANSDERMAL | Status: DC

## 2010-09-22 MED ORDER — ALBUTEROL SULFATE (5 MG/ML) 0.5% IN NEBU
2.5000 mg | INHALATION_SOLUTION | Freq: Once | RESPIRATORY_TRACT | Status: AC
  Administered 2010-09-22: 2.5 mg via RESPIRATORY_TRACT

## 2010-09-22 MED ORDER — GUAIFENESIN-CODEINE 100-10 MG/5ML PO SYRP: 5.0000 mL | ORAL_SOLUTION | Freq: Two times a day (BID) | ORAL | Status: DC | PRN

## 2010-09-22 MED ORDER — LEVOFLOXACIN 500 MG PO TABS: 500.0000 mg | ORAL_TABLET | Freq: Every day | ORAL | Status: AC

## 2010-09-22 NOTE — Patient Instructions (Signed)
Bronchitis Bronchitis is the body's way of reacting to injury and/or infection (inflammation) of the bronchi. Bronchi are the air tubes that extend from the windpipe into the lungs. If the inflammation becomes severe, it may cause shortness of breath.  CAUSES Inflammation may be caused by:  A virus.   Germs (bacteria).   Dust.   Allergens.   Pollutants and many other irritants.  The cells lining the bronchial tree are covered with tiny hairs (cilia). These constantly beat upward, away from the lungs, toward the mouth. This keeps the lungs free of pollutants. When these cells become too irritated and are unable to do their job, mucus begins to develop. This causes the characteristic cough of bronchitis. The cough clears the lungs when the cilia are unable to do their job. Without either of these protective mechanisms, the mucus would settle in the lungs. Then you would develop pneumonia. Smoking is a common cause of bronchitis and can contribute to pneumonia. Stopping this habit is the single most important thing you can do to help yourself. TREATMENT  Your caregiver may prescribe an antibiotic if the cough is caused by bacteria. Also, medicines that open up your airways make it easier to breathe. Your caregiver may also recommend or prescribe an expectorant. It will loosen the mucus to be coughed up. Only take over-the-counter or prescription medicines for pain, discomfort, or fever as directed by your caregiver.   Removing whatever causes the problem (smoking, for example) is critical to preventing the problem from getting worse.   Cough suppressants may be prescribed for relief of cough symptoms.   Inhaled medicines may be prescribed to help with symptoms now and to help prevent problems from returning.   For those with recurrent (chronic) bronchitis, there may be a need for steroid medicines.  SEEK IMMEDIATE MEDICAL CARE IF:  During treatment, you develop more pus-like mucus  (purulent sputum).   You or your child has an oral temperature above 100.4, not controlled by medicine.   Your baby is older than 3 months with a rectal temperature of 102 F (38.9 C) or higher.   Your baby is 3 months old or younger with a rectal temperature of 100.4 F (38 C) or higher.   You become progressively more ill.   You have increased difficulty breathing, wheezing, or shortness of breath.  It is necessary to seek immediate medical care if you are elderly or sick from any other disease. MAKE SURE YOU:  Understand these instructions.   Will watch your condition.   Will get help right away if you are not doing well or get worse.  Document Released: 12/19/2004 Document Re-Released: 03/15/2009 [employer] Patient Information 2011 [employer]. 

## 2010-09-22 NOTE — Progress Notes (Signed)
  Subjective:     Vickie Nichols is a 55 y.o. female here for evaluation of a cough. Onset of symptoms was 6 days ago. Symptoms have been gradually worsening since that time. The cough is productive and is aggravated by exercise and reclining position. Associated symptoms include: fever, shortness of breath and sputum production. Patient does not have a history of asthma. Patient does not have a history of environmental allergens. Patient has not traveled recently. Patient does have a history of smoking. Patient has not had a previous chest x-ray. Patient has not had a PPD done.  The following portions of the patient's history were reviewed and updated as appropriate: allergies, current medications, past family history, past medical history, past social history, past surgical history and problem list.  Review of Systems Pertinent items are noted in HPI.    Objective:    BP 138/82  Temp(Src) 100.9 F (38.3 C) (Oral)  Wt 236 lb (107.049 kg) General appearance: alert, cooperative, appears stated age and no distress Ears: normal TM's and external ear canals both ears Nose: Nares normal. Septum midline. Mucosa normal. No drainage or sinus tenderness. Throat: lips, mucosa, and tongue normal; teeth and gums normal Neck: mild anterior cervical adenopathy, supple, symmetrical, trachea midline and thyroid not enlarged, symmetric, no tenderness/mass/nodules Lungs: diminished breath sounds bilaterally and rhonchi bilaterally Heart: regular rate and rhythm, S1, S2 normal, no murmur, click, rub or gallop    Assessment:    Acute Bronchitis    Plan:    Antibiotics per medication orders. Antitussives per medication orders. Avoid exposure to tobacco smoke and fumes. Call if shortness of breath worsens, blood in sputum, change in character of cough, development of fever or chills, inability to maintain nutrition and hydration. Avoid exposure to tobacco smoke and fumes. Chest x-ray.

## 2010-09-25 ENCOUNTER — Other Ambulatory Visit: Payer: Self-pay | Admitting: Family Medicine

## 2010-09-26 NOTE — Telephone Encounter (Signed)
Last seen 09/22/10 and filled 06/15/10 #30 with 2 refills. Please advise    KP

## 2010-10-28 ENCOUNTER — Other Ambulatory Visit: Payer: Self-pay | Admitting: Family Medicine

## 2010-10-28 MED ORDER — CITALOPRAM HYDROBROMIDE 40 MG PO TABS: 40.0000 mg | ORAL_TABLET | Freq: Every day | ORAL | Status: DC

## 2010-10-28 NOTE — Telephone Encounter (Signed)
Refaxed  90 with 0 refills     KP

## 2010-10-31 ENCOUNTER — Other Ambulatory Visit: Payer: Self-pay | Admitting: Family Medicine

## 2010-10-31 DIAGNOSIS — Z780 Asymptomatic menopausal state: Secondary | ICD-10-CM

## 2010-10-31 MED ORDER — ESTRADIOL-NORETHINDRONE ACET 0.05-0.25 MG/DAY TD PTTW: 1.0000 | MEDICATED_PATCH | TRANSDERMAL | Status: DC

## 2010-10-31 NOTE — Telephone Encounter (Signed)
Faxed.   KP 

## 2011-01-11 ENCOUNTER — Ambulatory Visit (INDEPENDENT_AMBULATORY_CARE_PROVIDER_SITE_OTHER): Payer: Managed Care, Other (non HMO) | Admitting: *Deleted

## 2011-01-11 DIAGNOSIS — Z23 Encounter for immunization: Secondary | ICD-10-CM

## 2011-01-16 ENCOUNTER — Ambulatory Visit: Payer: Managed Care, Other (non HMO)

## 2011-02-20 ENCOUNTER — Other Ambulatory Visit: Payer: Self-pay | Admitting: Family Medicine

## 2011-02-20 MED ORDER — CITALOPRAM HYDROBROMIDE 40 MG PO TABS: 40.0000 mg | ORAL_TABLET | Freq: Every day | ORAL | Status: DC

## 2011-02-20 NOTE — Telephone Encounter (Signed)
Faxed.   KP 

## 2011-04-21 ENCOUNTER — Ambulatory Visit: Payer: Managed Care, Other (non HMO) | Admitting: Internal Medicine

## 2011-04-24 ENCOUNTER — Ambulatory Visit (INDEPENDENT_AMBULATORY_CARE_PROVIDER_SITE_OTHER): Payer: Managed Care, Other (non HMO) | Admitting: Family Medicine

## 2011-04-24 ENCOUNTER — Encounter: Payer: Self-pay | Admitting: Family Medicine

## 2011-04-24 VITALS — BP 129/82 | HR 81 | Temp 98.5°F | Ht 68.75 in | Wt 230.8 lb

## 2011-04-24 DIAGNOSIS — F418 Other specified anxiety disorders: Secondary | ICD-10-CM

## 2011-04-24 DIAGNOSIS — E119 Type 2 diabetes mellitus without complications: Secondary | ICD-10-CM

## 2011-04-24 DIAGNOSIS — T148XXA Other injury of unspecified body region, initial encounter: Secondary | ICD-10-CM

## 2011-04-24 DIAGNOSIS — R5383 Other fatigue: Secondary | ICD-10-CM

## 2011-04-24 DIAGNOSIS — W57XXXA Bitten or stung by nonvenomous insect and other nonvenomous arthropods, initial encounter: Secondary | ICD-10-CM

## 2011-04-24 DIAGNOSIS — F341 Dysthymic disorder: Secondary | ICD-10-CM

## 2011-04-24 LAB — CBC WITH DIFFERENTIAL/PLATELET
Basophils Absolute: 0.1 10*3/uL (ref 0.0–0.1)
Basophils Relative: 0.5 % (ref 0.0–3.0)
Eosinophils Absolute: 0.1 10*3/uL (ref 0.0–0.7)
Hemoglobin: 13.6 g/dL (ref 12.0–15.0)
Lymphocytes Relative: 28.5 % (ref 12.0–46.0)
Lymphs Abs: 3 10*3/uL (ref 0.7–4.0)
MCHC: 33.4 g/dL (ref 30.0–36.0)
MCV: 93.3 fl (ref 78.0–100.0)
Monocytes Absolute: 0.4 10*3/uL (ref 0.1–1.0)
Neutro Abs: 7.1 10*3/uL (ref 1.4–7.7)
RBC: 4.36 Mil/uL (ref 3.87–5.11)
RDW: 13.4 % (ref 11.5–14.6)

## 2011-04-24 LAB — BASIC METABOLIC PANEL
CO2: 26 mEq/L (ref 19–32)
Calcium: 9 mg/dL (ref 8.4–10.5)
Chloride: 106 mEq/L (ref 96–112)
Glucose, Bld: 115 mg/dL — ABNORMAL HIGH (ref 70–99)
Sodium: 141 mEq/L (ref 135–145)

## 2011-04-24 MED ORDER — TRIAMCINOLONE ACETONIDE 0.1 % EX OINT: TOPICAL_OINTMENT | Freq: Two times a day (BID) | CUTANEOUS | Status: DC

## 2011-04-24 MED ORDER — VENLAFAXINE HCL ER 37.5 MG PO CP24: 37.5000 mg | ORAL_CAPSULE | Freq: Every day | ORAL | Status: DC

## 2011-04-24 NOTE — Progress Notes (Signed)
  Subjective:    Patient ID: Vickie Nichols, female    DOB: 06/09/1955, 56 y.o.   MRN: [id number]  HPI Depression/anxiety- husband was killed almost 1 year ago, was started on Celexa and titrated to 40mg .  Reports increased fatigue.  On Friday started breaking Celexa tabs in half to 20mg  daily.  'i feel like it's too much'.  Interested in decreasing to 10mg .  Feels fatigue is 'a recent thing'.  More noticeable when she started to house hunt and put house on market.  Finds herself nodding off during the work week.  Bug bites- up and down L arm, very itchy.   Review of Systems For ROS see HPI     Objective:   Physical Exam  Vitals reviewed. Constitutional: She is oriented to person, place, and time. She appears well-developed and well-nourished. No distress.  HENT:  Head: Normocephalic and atraumatic.  Neck: Normal range of motion. Neck supple. No thyromegaly present.  Cardiovascular: Normal rate, regular rhythm and normal heart sounds.   No murmur heard. Pulmonary/Chest: Effort normal and breath sounds normal. No respiratory distress. She has no wheezes. She has no rales.  Musculoskeletal: She exhibits no edema.  Neurological: She is alert and oriented to person, place, and time.  Skin: Skin is warm and dry.       Multiple insect bites on arms bilaterally- no indication of infxn  Psychiatric: She has a normal mood and affect. Her behavior is normal. Thought content normal.          Assessment & Plan:

## 2011-04-24 NOTE — Patient Instructions (Signed)
Follow up in 1 month to recheck mood STOP the Celexa START the Effexor (venlafaxine) daily Use the Triamcinolone twice daily as needed for itching We'll notify you of your lab results Call with any questions or concerns Hang in there!

## 2011-04-25 ENCOUNTER — Encounter: Payer: Self-pay | Admitting: *Deleted

## 2011-05-08 ENCOUNTER — Ambulatory Visit: Payer: Managed Care, Other (non HMO) | Admitting: Family Medicine

## 2011-05-09 NOTE — Assessment & Plan Note (Signed)
Unchanged.  Pt has 1 yr anniversary of husband's death upcoming.  Based on this, do not want to wean meds.  Will switch from celexa to effexor as it is more activating.  Pt to f/u w/ PCP.

## 2011-05-09 NOTE — Assessment & Plan Note (Signed)
New.  Start Triamcinolone for itching.

## 2011-05-09 NOTE — Assessment & Plan Note (Signed)
Chronic problem.  Due for labs.  Ensure that elevated CBGs are not contributing to fatigue.

## 2011-05-09 NOTE — Assessment & Plan Note (Signed)
New.  Check labs to r/o hypothyroid, anemia, uncontrolled diabetes.  Most likely due to depression/med combo- anniversary is upcoming and pt is selling house.  Will adjust meds and follow.  Pt expressed understanding and is in agreement w/ plan.

## 2011-05-24 ENCOUNTER — Encounter: Payer: Self-pay | Admitting: Family Medicine

## 2011-05-24 ENCOUNTER — Ambulatory Visit (INDEPENDENT_AMBULATORY_CARE_PROVIDER_SITE_OTHER): Payer: Managed Care, Other (non HMO) | Admitting: Family Medicine

## 2011-05-24 VITALS — BP 126/74 | HR 79 | Temp 98.3°F | Wt 235.6 lb

## 2011-05-24 DIAGNOSIS — F418 Other specified anxiety disorders: Secondary | ICD-10-CM

## 2011-05-24 DIAGNOSIS — F341 Dysthymic disorder: Secondary | ICD-10-CM

## 2011-05-24 MED ORDER — VENLAFAXINE HCL ER 75 MG PO CP24: 75.0000 mg | ORAL_CAPSULE | Freq: Every day | ORAL | Status: DC

## 2011-05-24 NOTE — Patient Instructions (Signed)

## 2011-05-24 NOTE — Assessment & Plan Note (Signed)
Increase effexor to 75mg   Daily rto 4-6 weeks or sooner prn

## 2011-05-24 NOTE — Progress Notes (Signed)
  Subjective:    Patient ID: Vickie Nichols, female    DOB: [birth year], 56 y.o.   MRN: [id number]  HPI Pt here to f/u depression.  Pt was switched to effexor the end of last month.  She feels more her self but is very fatigued. No other complaints.   Review of Systems    as above Objective:   Physical Exam  Constitutional: She is oriented to person, place, and time. She appears well-developed and well-nourished.  Neurological: She is alert and oriented to person, place, and time.  Psychiatric: She has a normal mood and affect. Her behavior is normal. Judgment and thought content normal.          Assessment & Plan:

## 2011-06-15 ENCOUNTER — Ambulatory Visit (INDEPENDENT_AMBULATORY_CARE_PROVIDER_SITE_OTHER): Payer: Managed Care, Other (non HMO) | Admitting: Family Medicine

## 2011-06-15 ENCOUNTER — Encounter: Payer: Self-pay | Admitting: Family Medicine

## 2011-06-15 ENCOUNTER — Other Ambulatory Visit (HOSPITAL_COMMUNITY)
Admission: RE | Admit: 2011-06-15 | Discharge: 2011-06-15 | Disposition: A | Discharge status: Home / Self Care | Payer: Managed Care, Other (non HMO) | Source: Ambulatory Visit | Attending: Family Medicine | Admitting: Family Medicine

## 2011-06-15 VITALS — BP 122/70 | HR 71 | Temp 98.3°F | Ht 69.0 in | Wt 231.0 lb

## 2011-06-15 DIAGNOSIS — Z1231 Encounter for screening mammogram for malignant neoplasm of breast: Secondary | ICD-10-CM

## 2011-06-15 DIAGNOSIS — Z0000 Encounter for general adult medical examination without abnormal findings: Secondary | ICD-10-CM

## 2011-06-15 DIAGNOSIS — Z23 Encounter for immunization: Secondary | ICD-10-CM

## 2011-06-15 DIAGNOSIS — Z01419 Encounter for gynecological examination (general) (routine) without abnormal findings: Secondary | ICD-10-CM | POA: Insufficient documentation

## 2011-06-15 DIAGNOSIS — E119 Type 2 diabetes mellitus without complications: Secondary | ICD-10-CM

## 2011-06-15 DIAGNOSIS — Z124 Encounter for screening for malignant neoplasm of cervix: Secondary | ICD-10-CM

## 2011-06-15 DIAGNOSIS — Z780 Asymptomatic menopausal state: Secondary | ICD-10-CM

## 2011-06-15 DIAGNOSIS — Z87891 Personal history of nicotine dependence: Secondary | ICD-10-CM

## 2011-06-15 DIAGNOSIS — Z1239 Encounter for other screening for malignant neoplasm of breast: Secondary | ICD-10-CM

## 2011-06-15 LAB — POCT URINALYSIS DIPSTICK
Bilirubin, UA: NEGATIVE
Blood, UA: NEGATIVE
Glucose, UA: NEGATIVE
Nitrite, UA: NEGATIVE
Spec Grav, UA: >=1.03
pH, UA: 5

## 2011-06-15 LAB — CBC WITH DIFFERENTIAL/PLATELET
Eosinophils Absolute: 0.1 10*3/uL (ref 0.0–0.7)
HCT: 42.3 % (ref 36.0–46.0)
Lymphs Abs: 4.9 10*3/uL — ABNORMAL HIGH (ref 0.7–4.0)
MCHC: 33.2 g/dL (ref 30.0–36.0)
MCV: 92.9 fl (ref 78.0–100.0)
Monocytes Absolute: 0.5 10*3/uL (ref 0.1–1.0)
Neutrophils Relative %: 55.6 % (ref 43.0–77.0)
Platelets: 257 10*3/uL (ref 150.0–400.0)
RDW: 13.4 % (ref 11.5–14.6)

## 2011-06-15 LAB — BASIC METABOLIC PANEL
BUN: 12 mg/dL (ref 6–23)
CO2: 28 mEq/L (ref 19–32)
Calcium: 9.1 mg/dL (ref 8.4–10.5)
Creatinine, Ser: 0.8 mg/dL (ref 0.4–1.2)
GFR: 80.06 mL/min (ref >60.00)
Glucose, Bld: 104 mg/dL — ABNORMAL HIGH (ref 70–99)

## 2011-06-15 LAB — HEPATIC FUNCTION PANEL
ALT: 37 U/L — ABNORMAL HIGH (ref 0–35)
Bilirubin, Direct: 0 mg/dL (ref 0.0–0.3)
Total Bilirubin: 0.6 mg/dL (ref 0.3–1.2)

## 2011-06-15 LAB — LIPID PANEL
Cholesterol: 187 mg/dL (ref 0–200)
HDL: 36.7 mg/dL — ABNORMAL LOW (ref >39.00)
LDL Cholesterol: 122 mg/dL — ABNORMAL HIGH (ref 0–99)
Total CHOL/HDL Ratio: 5
Triglycerides: 144 mg/dL (ref 0.0–149.0)

## 2011-06-15 LAB — MICROALBUMIN / CREATININE URINE RATIO
Creatinine,U: 205.6 mg/dL
Microalb, Ur: 1.1 mg/dL (ref 0.0–1.9)

## 2011-06-15 LAB — HEMOGLOBIN A1C: Hgb A1c MFr Bld: 7.1 % — ABNORMAL HIGH (ref 4.6–6.5)

## 2011-06-15 MED ORDER — VARENICLINE TARTRATE 0.5 MG X 11 & 1 MG X 42 PO MISC: ORAL | Status: DC

## 2011-06-15 NOTE — Patient Instructions (Addendum)
Preventive Care for Adults, Female A healthy lifestyle and preventive care can promote health and wellness. Preventive health guidelines for women include the following key practices.  A routine yearly physical is a good way to check with your caregiver about your health and preventive screening. It is a chance to share any concerns and updates on your health, and to receive a thorough exam.   Visit your dentist for a routine exam and preventive care every 6 months. Brush your teeth twice a day and floss once a day. Good oral hygiene prevents tooth decay and gum disease.   The frequency of eye exams is based on your age, health, family medical history, use of contact lenses, and other factors. Follow your caregiver's recommendations for frequency of eye exams.   Eat a healthy diet. Foods like vegetables, fruits, whole grains, low-fat dairy products, and lean protein foods contain the nutrients you need without too many calories. Decrease your intake of foods high in solid fats, added sugars, and salt. Eat the right amount of calories for you.Get information about a proper diet from your caregiver, if necessary.   Regular physical exercise is one of the most important things you can do for your health. Most adults should get at least 150 minutes of moderate-intensity exercise (any activity that increases your heart rate and causes you to sweat) each week. In addition, most adults need muscle-strengthening exercises on 2 or more days a week.   Maintain a healthy weight. The body mass index (BMI) is a screening tool to identify possible weight problems. It provides an estimate of body fat based on height and weight. Your caregiver can help determine your BMI, and can help you achieve or maintain a healthy weight.For adults 20 years and older:   A BMI below 18.5 is considered underweight.   A BMI of 18.5 to 24.9 is normal.   A BMI of 25 to 29.9 is considered overweight.   A BMI of 30 and above is  considered obese.   Maintain normal blood lipids and cholesterol levels by exercising and minimizing your intake of saturated fat. Eat a balanced diet with plenty of fruit and vegetables. Blood tests for lipids and cholesterol should begin at age 20 and be repeated every 5 years. If your lipid or cholesterol levels are high, you are over 50, or you are at high risk for heart disease, you may need your cholesterol levels checked more frequently.Ongoing high lipid and cholesterol levels should be treated with medicines if diet and exercise are not effective.   If you smoke, find out from your caregiver how to quit. If you do not use tobacco, do not start.   If you are pregnant, do not drink alcohol. If you are breastfeeding, be very cautious about drinking alcohol. If you are not pregnant and choose to drink alcohol, do not exceed 1 drink per day. One drink is considered to be 12 ounces (355 mL) of beer, 5 ounces (148 mL) of wine, or 1.5 ounces (44 mL) of liquor.   Avoid use of street drugs. Do not share needles with anyone. Ask for help if you need support or instructions about stopping the use of drugs.   High blood pressure causes heart disease and increases the risk of stroke. Your blood pressure should be checked at least every 1 to 2 years. Ongoing high blood pressure should be treated with medicines if weight loss and exercise are not effective.   If you are 55 to 56   years old, ask your caregiver if you should take aspirin to prevent strokes.   Diabetes screening involves taking a blood sample to check your fasting blood sugar level. This should be done once every 3 years, after age 45, if you are within normal weight and without risk factors for diabetes. Testing should be considered at a younger age or be carried out more frequently if you are overweight and have at least 1 risk factor for diabetes.   Breast cancer screening is essential preventive care for women. You should practice "breast  self-awareness." This means understanding the normal appearance and feel of your breasts and may include breast self-examination. Any changes detected, no matter how small, should be reported to a caregiver. Women in their 20s and 30s should have a clinical breast exam (CBE) by a caregiver as part of a regular health exam every 1 to 3 years. After age 40, women should have a CBE every year. Starting at age 40, women should consider having a mammography (breast X-ray test) every year. Women who have a family history of breast cancer should talk to their caregiver about genetic screening. Women at a high risk of breast cancer should talk to their caregivers about having magnetic resonance imaging (MRI) and a mammography every year.   The Pap test is a screening test for cervical cancer. A Pap test can show cell changes on the cervix that might become cervical cancer if left untreated. A Pap test is a procedure in which cells are obtained and examined from the lower end of the uterus (cervix).   Women should have a Pap test starting at age 21.   Between ages 21 and 29, Pap tests should be repeated every 2 years.   Beginning at age 30, you should have a Pap test every 3 years as long as the past 3 Pap tests have been normal.   Some women have medical problems that increase the chance of getting cervical cancer. Talk to your caregiver about these problems. It is especially important to talk to your caregiver if a new problem develops soon after your last Pap test. In these cases, your caregiver may recommend more frequent screening and Pap tests.   The above recommendations are the same for women who have or have not gotten the vaccine for human papillomavirus (HPV).   If you had a hysterectomy for a problem that was not cancer or a condition that could lead to cancer, then you no longer need Pap tests. Even if you no longer need a Pap test, a regular exam is a good idea to make sure no other problems are  starting.   If you are between ages 65 and 70, and you have had normal Pap tests going back 10 years, you no longer need Pap tests. Even if you no longer need a Pap test, a regular exam is a good idea to make sure no other problems are starting.   If you have had past treatment for cervical cancer or a condition that could lead to cancer, you need Pap tests and screening for cancer for at least 20 years after your treatment.   If Pap tests have been discontinued, risk factors (such as a new sexual partner) need to be reassessed to determine if screening should be resumed.   The HPV test is an additional test that may be used for cervical cancer screening. The HPV test looks for the virus that can cause the cell changes on the cervix.   The cells collected during the Pap test can be tested for HPV. The HPV test could be used to screen women aged 30 years and older, and should be used in women of any age who have unclear Pap test results. After the age of 30, women should have HPV testing at the same frequency as a Pap test.   Colorectal cancer can be detected and often prevented. Most routine colorectal cancer screening begins at the age of 50 and continues through age 75. However, your caregiver may recommend screening at an earlier age if you have risk factors for colon cancer. On a yearly basis, your caregiver may provide home test kits to check for hidden blood in the stool. Use of a small camera at the end of a tube, to directly examine the colon (sigmoidoscopy or colonoscopy), can detect the earliest forms of colorectal cancer. Talk to your caregiver about this at age 50, when routine screening begins. Direct examination of the colon should be repeated every 5 to 10 years through age 75, unless early forms of pre-cancerous polyps or small growths are found.   Hepatitis C blood testing is recommended for all people born from 1945 through 1965 and any individual with known risks for hepatitis C.    Practice safe sex. Use condoms and avoid high-risk sexual practices to reduce the spread of sexually transmitted infections (STIs). STIs include gonorrhea, chlamydia, syphilis, trichomonas, herpes, HPV, and human immunodeficiency virus (HIV). Herpes, HIV, and HPV are viral illnesses that have no cure. They can result in disability, cancer, and death. Sexually active women aged 25 and younger should be checked for chlamydia. Older women with new or multiple partners should also be tested for chlamydia. Testing for other STIs is recommended if you are sexually active and at increased risk.   Osteoporosis is a disease in which the bones lose minerals and strength with aging. This can result in serious bone fractures. The risk of osteoporosis can be identified using a bone density scan. Women ages 65 and over and women at risk for fractures or osteoporosis should discuss screening with their caregivers. Ask your caregiver whether you should take a calcium supplement or vitamin D to reduce the rate of osteoporosis.   Menopause can be associated with physical symptoms and risks. Hormone replacement therapy is available to decrease symptoms and risks. You should talk to your caregiver about whether hormone replacement therapy is right for you.   Use sunscreen with sun protection factor (SPF) of 30 or more. Apply sunscreen liberally and repeatedly throughout the day. You should seek shade when your shadow is shorter than you. Protect yourself by wearing long sleeves, pants, a wide-brimmed hat, and sunglasses year round, whenever you are outdoors.   Once a month, do a whole body skin exam, using a mirror to look at the skin on your back. Notify your caregiver of new moles, moles that have irregular borders, moles that are larger than a pencil eraser, or moles that have changed in shape or color.   Stay current with required immunizations.   Influenza. You need a dose every fall (or winter). The composition of  the flu vaccine changes each year, so being vaccinated once is not enough.   Pneumococcal polysaccharide. You need 1 to 2 doses if you smoke cigarettes or if you have certain chronic medical conditions. You need 1 dose at age 65 (or older) if you have never been vaccinated.   Tetanus, diphtheria, pertussis (Tdap, Td). Get 1 dose of   Tdap vaccine if you are younger than age 65, are over 65 and have contact with an infant, are a healthcare worker, are pregnant, or simply want to be protected from whooping cough. After that, you need a Td booster dose every 10 years. Consult your caregiver if you have not had at least 3 tetanus and diphtheria-containing shots sometime in your life or have a deep or dirty wound.   HPV. You need this vaccine if you are a woman age 26 or younger. The vaccine is given in 3 doses over 6 months.   Measles, mumps, rubella (MMR). You need at least 1 dose of MMR if you were born in 1957 or later. You may also need a second dose.   Meningococcal. If you are age 19 to 21 and a first-year college student living in a residence hall, or have one of several medical conditions, you need to get vaccinated against meningococcal disease. You may also need additional booster doses.   Zoster (shingles). If you are age 60 or older, you should get this vaccine.   Varicella (chickenpox). If you have never had chickenpox or you were vaccinated but received only 1 dose, talk to your caregiver to find out if you need this vaccine.   Hepatitis A. You need this vaccine if you have a specific risk factor for hepatitis A virus infection or you simply wish to be protected from this disease. The vaccine is usually given as 2 doses, 6 to 18 months apart.   Hepatitis B. You need this vaccine if you have a specific risk factor for hepatitis B virus infection or you simply wish to be protected from this disease. The vaccine is given in 3 doses, usually over 6 months.  Preventive Services /  Frequency Ages 19 to 39  Blood pressure check.** / Every 1 to 2 years.   Lipid and cholesterol check.** / Every 5 years beginning at age 20.   Clinical breast exam.** / Every 3 years for women in their 20s and 30s.   Pap test.** / Every 2 years from ages 21 through 29. Every 3 years starting at age 30 through age 65 or 70 with a history of 3 consecutive normal Pap tests.   HPV screening.** / Every 3 years from ages 30 through ages 65 to 70 with a history of 3 consecutive normal Pap tests.   Hepatitis C blood test.** / For any individual with known risks for hepatitis C.   Skin self-exam. / Monthly.   Influenza immunization.** / Every year.   Pneumococcal polysaccharide immunization.** / 1 to 2 doses if you smoke cigarettes or if you have certain chronic medical conditions.   Tetanus, diphtheria, pertussis (Tdap, Td) immunization. / A one-time dose of Tdap vaccine. After that, you need a Td booster dose every 10 years.   HPV immunization. / 3 doses over 6 months, if you are 26 and younger.   Measles, mumps, rubella (MMR) immunization. / You need at least 1 dose of MMR if you were born in 1957 or later. You may also need a second dose.   Meningococcal immunization. / 1 dose if you are age 19 to 21 and a first-year college student living in a residence hall, or have one of several medical conditions, you need to get vaccinated against meningococcal disease. You may also need additional booster doses.   Varicella immunization.** / Consult your caregiver.   Hepatitis A immunization.** / Consult your caregiver. 2 doses, 6 to 18 months   apart.   Hepatitis B immunization.** / Consult your caregiver. 3 doses usually over 6 months.  Ages 40 to 64  Blood pressure check.** / Every 1 to 2 years.   Lipid and cholesterol check.** / Every 5 years beginning at age 20.   Clinical breast exam.** / Every year after age 40.   Mammogram.** / Every year beginning at age 40 and continuing for as  long as you are in good health. Consult with your caregiver.   Pap test.** / Every 3 years starting at age 30 through age 65 or 70 with a history of 3 consecutive normal Pap tests.   HPV screening.** / Every 3 years from ages 30 through ages 65 to 70 with a history of 3 consecutive normal Pap tests.   Fecal occult blood test (FOBT) of stool. / Every year beginning at age 50 and continuing until age 75. You may not need to do this test if you get a colonoscopy every 10 years.   Flexible sigmoidoscopy or colonoscopy.** / Every 5 years for a flexible sigmoidoscopy or every 10 years for a colonoscopy beginning at age 50 and continuing until age 75.   Hepatitis C blood test.** / For all people born from 1945 through 1965 and any individual with known risks for hepatitis C.   Skin self-exam. / Monthly.   Influenza immunization.** / Every year.   Pneumococcal polysaccharide immunization.** / 1 to 2 doses if you smoke cigarettes or if you have certain chronic medical conditions.   Tetanus, diphtheria, pertussis (Tdap, Td) immunization.** / A one-time dose of Tdap vaccine. After that, you need a Td booster dose every 10 years.   Measles, mumps, rubella (MMR) immunization. / You need at least 1 dose of MMR if you were born in 1957 or later. You may also need a second dose.   Varicella immunization.** / Consult your caregiver.   Meningococcal immunization.** / Consult your caregiver.   Hepatitis A immunization.** / Consult your caregiver. 2 doses, 6 to 18 months apart.   Hepatitis B immunization.** / Consult your caregiver. 3 doses, usually over 6 months.  Ages 65 and over  Blood pressure check.** / Every 1 to 2 years.   Lipid and cholesterol check.** / Every 5 years beginning at age 20.   Clinical breast exam.** / Every year after age 40.   Mammogram.** / Every year beginning at age 40 and continuing for as long as you are in good health. Consult with your caregiver.   Pap test.** /  Every 3 years starting at age 30 through age 65 or 70 with a 3 consecutive normal Pap tests. Testing can be stopped between 65 and 70 with 3 consecutive normal Pap tests and no abnormal Pap or HPV tests in the past 10 years.   HPV screening.** / Every 3 years from ages 30 through ages 65 or 70 with a history of 3 consecutive normal Pap tests. Testing can be stopped between 65 and 70 with 3 consecutive normal Pap tests and no abnormal Pap or HPV tests in the past 10 years.   Fecal occult blood test (FOBT) of stool. / Every year beginning at age 50 and continuing until age 75. You may not need to do this test if you get a colonoscopy every 10 years.   Flexible sigmoidoscopy or colonoscopy.** / Every 5 years for a flexible sigmoidoscopy or every 10 years for a colonoscopy beginning at age 50 and continuing until age 75.   Hepatitis   C blood test.** / For all people born from [birth year] through 1965 and any individual with known risks for hepatitis C.   Osteoporosis screening.** / A one-time screening for women ages 38 and over and women at risk for fractures or osteoporosis.   Skin self-exam. / Monthly.   Influenza immunization.** / Every year.   Pneumococcal polysaccharide immunization.** / 1 dose at age 30 (or older) if you have never been vaccinated.   Tetanus, diphtheria, pertussis (Tdap, Td) immunization. / A one-time dose of Tdap vaccine if you are over 65 and have contact with an infant, are a Research scientist (physical sciences), or simply want to be protected from whooping cough. After that, you need a Td booster dose every 10 years.   Varicella immunization.** / Consult your caregiver.   Meningococcal immunization.** / Consult your caregiver.   Hepatitis A immunization.** / Consult your caregiver. 2 doses, 6 to 18 months apart.   Hepatitis B immunization.** / Check with your caregiver. 3 doses, usually over 6 months.  ** Family history and personal history of risk and conditions may change your caregiver's  recommendations. Document Released: 02/14/2001 Document Revised: 12/08/2010 Document Reviewed: 05/16/2010 [facility] Patient Information 2012 [employer], [place]. smokSmoking Cessation, Tips for Success YOU CAN QUIT SMOKING If you are ready to quit smoking, congratulations! You have chosen to help yourself be healthier. Cigarettes bring nicotine, tar, carbon monoxide, and other irritants into your body. Your lungs, heart, and blood vessels will be able to work better without these poisons. There are many different ways to quit smoking. Nicotine gum, nicotine patches, a nicotine inhaler, or nicotine nasal spray can help with physical craving. Hypnosis, support groups, and medicines help break the habit of smoking. Here are some tips to help you quit for good.  Throw away all cigarettes.   Clean and remove all ashtrays from your home, work, and car.   On a card, write down your reasons for quitting. Carry the card with you and read it when you get the urge to smoke.   Cleanse your body of nicotine. Drink enough water and fluids to keep your urine clear or pale yellow. Do this after quitting to flush the nicotine from your body.   Learn to predict your moods. Do not let a bad situation be your excuse to have a cigarette. Some situations in your life might tempt you into wanting a cigarette.   Never have "just one" cigarette. It leads to wanting another and another. Remind yourself of your decision to quit.   Change habits associated with smoking. If you smoked while driving or when feeling stressed, try other activities to replace smoking. Stand up when drinking your coffee. Brush your teeth after eating. Sit in a different chair when you read the paper. Avoid alcohol while trying to quit, and try to drink fewer caffeinated beverages. Alcohol and caffeine may urge you to smoke.   Avoid foods and drinks that can trigger a desire to smoke, such as sugary or spicy foods and alcohol.   Ask people who smoke  not to smoke around you.   Have something planned to do right after eating or having a cup of coffee. Take a walk or exercise to perk you up. This will help to keep you from overeating.   Try a relaxation exercise to calm you down and decrease your stress. Remember, you may be tense and nervous for the first 2 weeks after you quit, but this will pass.   Find new activities to keep  your hands busy. Play with a pen, coin, or rubber band. Doodle or draw things on paper.   Brush your teeth right after eating. This will help cut down on the craving for the taste of tobacco after meals. You can try mouthwash, too.   Use oral substitutes, such as lemon drops, carrots, a cinnamon stick, or chewing gum, in place of cigarettes. Keep them handy so they are available when you have the urge to smoke.   When you have the urge to smoke, try deep breathing.   Designate your home as a nonsmoking area.   If you are a heavy smoker, ask your caregiver about a prescription for nicotine chewing gum. It can ease your withdrawal from nicotine.   Reward yourself. Set aside the cigarette money you save and buy yourself something nice.   Look for support from others. Join a support group or smoking cessation program. Ask someone at home or at work to help you with your plan to quit smoking.   Always ask yourself, "Do I need this cigarette or is this just a reflex?" Tell yourself, "Today, I choose not to smoke," or "I do not want to smoke." You are reminding yourself of your decision to quit, even if you do smoke a cigarette.  HOW WILL I FEEL WHEN I QUIT SMOKING?  The benefits of not smoking start within days of quitting.   You may have symptoms of withdrawal because your body is used to nicotine (the addictive substance in cigarettes). You may crave cigarettes, be irritable, feel very hungry, cough often, get headaches, or have difficulty concentrating.   The withdrawal symptoms are only temporary. They are  strongest when you first quit but will go away within 10 to 14 days.   When withdrawal symptoms occur, stay in control. Think about your reasons for quitting. Remind yourself that these are signs that your body is healing and getting used to being without cigarettes.   Remember that withdrawal symptoms are easier to treat than the major diseases that smoking can cause.   Even after the withdrawal is over, expect periodic urges to smoke. However, these cravings are generally short-lived and will go away whether you smoke or not. Do not smoke!   If you relapse and smoke again, do not lose hope. Most smokers quit 3 times before they are successful.   If you relapse, do not give up! Plan ahead and think about what you will do the next time you get the urge to smoke.  LIFE AS A NONSMOKER: MAKE IT FOR A MONTH, MAKE IT FOR LIFE Day 1: Hang this page where you will see it every day. Day 2: Get rid of all ashtrays, matches, and lighters. Day 3: Drink water. Breathe deeply between sips. Day 4: Avoid places with smoke-filled air, such as bars, clubs, or the smoking section of restaurants. Day 5: Keep track of how much money you save by not smoking. Day 6: Avoid boredom. Keep a good book with you or go to the movies. Day 7: Reward yourself! One week without smoking! Day 8: Make a dental appointment to get your teeth cleaned. Day 9: Decide how you will turn down a cigarette before it is offered to you. Day 10: Review your reasons for quitting. Day 11: Distract yourself. Stay active to keep your mind off smoking and to relieve tension. Take a walk, exercise, read a book, do a crossword puzzle, or try a new hobby. Day 12: Exercise. Get off the  bus before your stop or use stairs instead of escalators. Day 13: Call on friends for support and encouragement. Day 14: Reward yourself! Two weeks without smoking! Day 15: Practice deep breathing exercises. Day 16: Bet a friend that you can stay a nonsmoker. Day  17: Ask to sit in nonsmoking sections of restaurants. Day 18: Hang up "No Smoking" signs. Day 19: Think of yourself as a nonsmoker. Day 20: Each morning, tell yourself you will not smoke. Day 21: Reward yourself! Three weeks without smoking! Day 22: Think of smoking in negative ways. Remember how it stains your teeth, gives you bad breath, and leaves you short of breath. Day 23: Eat a nutritious breakfast. Day 24:Do not relive your days as a smoker. Day 25: Hold a pencil in your hand when talking on the telephone. Day 26: Tell all your friends you do not smoke. Day 27: Think about how much better food tastes. Day 28: Remember, one cigarette is one too many. Day 29: Take up a hobby that will keep your hands busy. Day 30: Congratulations! One month without smoking! Give yourself a big reward. Your caregiver can direct you to community resources or hospitals for support, which may include:  Group support.   Education.   Hypnosis.   Subliminal therapy.  Document Released: 09/17/2003 Document Revised: 12/08/2010 Document Reviewed: 10/05/2008 [facility] Patient Information 2012 [place], [place].

## 2011-06-15 NOTE — Assessment & Plan Note (Signed)
chantix starter Handouts given

## 2011-06-15 NOTE — Assessment & Plan Note (Signed)
Check labs con't meds 

## 2011-06-15 NOTE — Progress Notes (Signed)
Subjective:     Vickie Nichols is a 56 y.o. female and is here for a comprehensive physical exam. The patient reports no problems.  History   Social History  . Marital Status: Widowed    Spouse Name: N/A    Number of Children: N/A  . Years of Education: N/A   Occupational History  . Not on file.   Social History Main Topics  . Smoking status: Current Everyday Smoker -- 0.5 packs/day    Types: Cigarettes    Last Attempt to Quit: 04/26/2010  . Smokeless tobacco: Never Used   Comment: started smoking again when she moved  . Alcohol Use: No  . Drug Use: No  . Sexually Active: Not Currently   Other Topics Concern  . Not on file   Social History Narrative  . No narrative on file   Health Maintenance  Topic Date Due  . Pap Smear  08/14/1973  . Mammogram  04/10/2008  . Influenza Vaccine  10/03/2011  . Colonoscopy  09/02/2018  . Tetanus/tdap  06/14/2021    The following portions of the patient's history were reviewed and updated as appropriate: allergies, current medications, past family history, past medical history, past social history, past surgical history and problem list.  Review of Systems Review of Systems  Constitutional: Negative for activity change, appetite change and fatigue.  HENT: Negative for hearing loss, congestion, tinnitus and ear discharge.  dentist q1y Eyes: Negative for visual disturbance (see optho q1y -- vision corrected to 20/20 with glasses).  Respiratory: Negative for cough, chest tightness and shortness of breath.   Cardiovascular: Negative for chest pain, palpitations and leg swelling.  Gastrointestinal: Negative for abdominal pain, diarrhea, constipation and abdominal distention.  Genitourinary: Negative for urgency, frequency, decreased urine volume and difficulty urinating.  Musculoskeletal: Negative for back pain, arthralgias and gait problem.  Skin: Negative for color change, pallor and rash.  Neurological: Negative for dizziness,  light-headedness, numbness and headaches.  Hematological: Negative for adenopathy. Does not bruise/bleed easily.  Psychiatric/Behavioral: Negative for suicidal ideas, confusion, sleep disturbance, self-injury, dysphoric mood, decreased concentration and agitation.       Objective:    BP 122/70  Pulse 71  Temp 98.3 F (36.8 C) (Oral)  Ht 5\' 9"  (1.753 m)  Wt 231 lb (104.781 kg)  BMI 34.11 kg/m2  SpO2 96% General appearance: alert, cooperative, appears stated age and no distress Head: Normocephalic, without obvious abnormality, atraumatic Eyes: conjunctivae/corneas clear. PERRL, EOM's intact. Fundi benign. Ears: normal TM's and external ear canals both ears Nose: Nares normal. Septum midline. Mucosa normal. No drainage or sinus tenderness. Throat: lips, mucosa, and tongue normal; teeth and gums normal Neck: no adenopathy, no carotid bruit, no JVD, supple, symmetrical, trachea midline and thyroid not enlarged, symmetric, no tenderness/mass/nodules Back: symmetric, no curvature. ROM normal. No CVA tenderness. Lungs: clear to auscultation bilaterally Breasts: normal appearance, no masses or tenderness Heart: regular rate and rhythm, S1, S2 normal, no murmur, click, rub or gallop Abdomen: soft, non-tender; bowel sounds normal; no masses,  no organomegaly Pelvic: cervix normal in appearance, external genitalia normal, no adnexal masses or tenderness, no cervical motion tenderness, rectovaginal septum normal, uterus normal size, shape, and consistency and vagina normal without discharge Extremities: extremities normal, atraumatic, no cyanosis or edema Pulses: 2+ and symmetric Skin: Skin color, texture, turgor normal. No rashes or lesions Lymph nodes: Cervical, supraclavicular, and axillary nodes normal. Neurologic: Alert and oriented X 3, normal strength and tone. Normal symmetric reflexes. Normal coordination and gait psych--  no depression, no anxiety    Assessment:    Healthy female  exam.     Plan:    ghm utd Check labs See After Visit Summary for Counseling Recommendations

## 2011-07-10 MED ORDER — METFORMIN HCL ER 500 MG PO TB24: 500.0000 mg | ORAL_TABLET | Freq: Every evening | ORAL | Status: DC

## 2011-07-31 ENCOUNTER — Other Ambulatory Visit (INDEPENDENT_AMBULATORY_CARE_PROVIDER_SITE_OTHER): Payer: Managed Care, Other (non HMO)

## 2011-07-31 DIAGNOSIS — D7289 Other specified disorders of white blood cells: Secondary | ICD-10-CM

## 2011-08-01 LAB — CBC WITH DIFFERENTIAL/PLATELET
Basophils Absolute: 0 10*3/uL (ref 0.0–0.1)
Eosinophils Relative: 1.3 % (ref 0.0–5.0)
Monocytes Absolute: 0.5 10*3/uL (ref 0.1–1.0)
Monocytes Relative: 6.5 % (ref 3.0–12.0)
Neutrophils Relative %: 29.7 % — ABNORMAL LOW (ref 43.0–77.0)
Platelets: 227 10*3/uL (ref 150.0–400.0)
WBC: 8.2 10*3/uL (ref 4.5–10.5)

## 2011-08-11 ENCOUNTER — Other Ambulatory Visit: Payer: Managed Care, Other (non HMO)

## 2011-08-11 ENCOUNTER — Other Ambulatory Visit: Payer: Self-pay | Admitting: Family Medicine

## 2011-08-11 DIAGNOSIS — D7289 Other specified disorders of white blood cells: Secondary | ICD-10-CM

## 2011-08-11 MED ORDER — METFORMIN HCL ER 500 MG PO TB24: 500.0000 mg | ORAL_TABLET | Freq: Every evening | ORAL | Status: DC

## 2011-08-11 NOTE — Telephone Encounter (Signed)
PT CAME IN AND WOULD LIKE THE FOLLOWING REFILLED  METFORMIN HCL ET 500MG   QTY:30  TAKE ONE TABLET BY MOUTH EVERY EVENING

## 2011-08-11 NOTE — Telephone Encounter (Signed)
SHE IS ASKING FOR THIS ASAP SHE IS OUT AND NEEDS IT TONIGHT

## 2011-08-12 LAB — CBC WITH DIFFERENTIAL/PLATELET
Eosinophils Relative: 2 % (ref 0–5)
HCT: 40.3 % (ref 36.0–46.0)
Lymphocytes Relative: 51 % — ABNORMAL HIGH (ref 12–46)
Lymphs Abs: 5.7 10*3/uL — ABNORMAL HIGH (ref 0.7–4.0)
MCV: 89.8 fL (ref 78.0–100.0)
Neutro Abs: 4.7 10*3/uL (ref 1.7–7.7)
Platelets: 297 10*3/uL (ref 150–400)
RBC: 4.49 MIL/uL (ref 3.87–5.11)
WBC: 11.1 10*3/uL — ABNORMAL HIGH (ref 4.0–10.5)

## 2011-08-21 ENCOUNTER — Other Ambulatory Visit: Payer: Self-pay | Admitting: Family Medicine

## 2011-08-23 ENCOUNTER — Other Ambulatory Visit (INDEPENDENT_AMBULATORY_CARE_PROVIDER_SITE_OTHER): Payer: Managed Care, Other (non HMO)

## 2011-08-23 DIAGNOSIS — D72829 Elevated white blood cell count, unspecified: Secondary | ICD-10-CM

## 2011-08-24 LAB — CBC WITH DIFFERENTIAL/PLATELET
Basophils Absolute: 0.1 10*3/uL (ref 0.0–0.1)
Basophils Relative: 1 % (ref 0.0–3.0)
Eosinophils Relative: 1.5 % (ref 0.0–5.0)
HCT: 42.1 % (ref 36.0–46.0)
Hemoglobin: 13.6 g/dL (ref 12.0–15.0)
Lymphocytes Relative: 51.8 % — ABNORMAL HIGH (ref 12.0–46.0)
Lymphs Abs: 5.9 10*3/uL — ABNORMAL HIGH (ref 0.7–4.0)
Monocytes Relative: 4.8 % (ref 3.0–12.0)
Neutro Abs: 4.6 10*3/uL (ref 1.4–7.7)
RBC: 4.49 Mil/uL (ref 3.87–5.11)
RDW: 13.2 % (ref 11.5–14.6)

## 2011-08-29 ENCOUNTER — Other Ambulatory Visit: Payer: Self-pay | Admitting: Family Medicine

## 2011-08-29 DIAGNOSIS — R7989 Other specified abnormal findings of blood chemistry: Secondary | ICD-10-CM

## 2011-08-30 ENCOUNTER — Ambulatory Visit (HOSPITAL_COMMUNITY)
Admission: RE | Admit: 2011-08-30 | Discharge: 2011-08-30 | Disposition: A | Discharge status: Home / Self Care | Payer: Managed Care, Other (non HMO) | Source: Ambulatory Visit | Attending: Family Medicine | Admitting: Family Medicine

## 2011-08-30 DIAGNOSIS — Z1231 Encounter for screening mammogram for malignant neoplasm of breast: Secondary | ICD-10-CM

## 2011-08-30 DIAGNOSIS — Z780 Asymptomatic menopausal state: Secondary | ICD-10-CM | POA: Insufficient documentation

## 2011-08-30 DIAGNOSIS — Z13820 Encounter for screening for osteoporosis: Secondary | ICD-10-CM | POA: Insufficient documentation

## 2011-09-12 ENCOUNTER — Telehealth: Payer: Self-pay | Admitting: Family Medicine

## 2011-09-12 MED ORDER — VENLAFAXINE HCL ER 75 MG PO CP24: 75.0000 mg | ORAL_CAPSULE | Freq: Every day | ORAL | Status: DC

## 2011-09-12 NOTE — Telephone Encounter (Signed)
Refill: Venlafaxine hcl er 75mg  cap. 90 day supply

## 2011-09-14 ENCOUNTER — Telehealth: Payer: Self-pay

## 2011-09-14 ENCOUNTER — Encounter: Payer: Self-pay | Admitting: Family Medicine

## 2011-09-14 NOTE — Telephone Encounter (Signed)
-----   Message from Lelon Perla, DO sent at 09/14/2011 5:17 PM ----- Pt will need lower dose 37.5 for at least 1 month--- after that if she needs more call---she may be able to stop after that     Message from Ethelda Chick to Lelon Perla, DO sent at 09/14/2011 11:00 AM ----- I want to look at getting off my antidepressants. I know I can't just stop taking them...what do I need to do?

## 2011-09-15 MED ORDER — VENLAFAXINE HCL 37.5 MG PO TABS: 37.5000 mg | ORAL_TABLET | Freq: Every day | ORAL | Status: DC

## 2011-09-15 NOTE — Telephone Encounter (Signed)
Discussed with patient and she voiced understanding. Rx has been faxed for the 37.5mg  and she will call with any concerns.      KP

## 2011-09-22 ENCOUNTER — Encounter: Payer: Self-pay | Admitting: Family Medicine

## 2011-09-22 ENCOUNTER — Other Ambulatory Visit (HOSPITAL_BASED_OUTPATIENT_CLINIC_OR_DEPARTMENT_OTHER): Payer: Managed Care, Other (non HMO) | Admitting: Lab

## 2011-09-22 ENCOUNTER — Other Ambulatory Visit (HOSPITAL_COMMUNITY)
Admission: RE | Admit: 2011-09-22 | Discharge: 2011-09-22 | Disposition: A | Discharge status: Home / Self Care | Payer: Managed Care, Other (non HMO) | Source: Ambulatory Visit | Attending: Hematology & Oncology | Admitting: Hematology & Oncology

## 2011-09-22 ENCOUNTER — Ambulatory Visit: Payer: Managed Care, Other (non HMO)

## 2011-09-22 ENCOUNTER — Ambulatory Visit (HOSPITAL_BASED_OUTPATIENT_CLINIC_OR_DEPARTMENT_OTHER): Payer: Managed Care, Other (non HMO) | Admitting: Hematology & Oncology

## 2011-09-22 VITALS — BP 141/68 | HR 73 | Temp 97.9°F | Resp 20 | Ht 69.0 in | Wt 231.0 lb

## 2011-09-22 DIAGNOSIS — D72820 Lymphocytosis (symptomatic): Secondary | ICD-10-CM

## 2011-09-22 DIAGNOSIS — F17200 Nicotine dependence, unspecified, uncomplicated: Secondary | ICD-10-CM

## 2011-09-22 DIAGNOSIS — D72829 Elevated white blood cell count, unspecified: Secondary | ICD-10-CM

## 2011-09-22 LAB — TECHNOLOGIST REVIEW CHCC SATELLITE

## 2011-09-22 LAB — CBC WITH DIFFERENTIAL (CANCER CENTER ONLY)
BASO%: 0.4 % (ref 0.0–2.0)
HCT: 42.4 % (ref 34.8–46.6)
LYMPH#: 5.7 10*3/uL — ABNORMAL HIGH (ref 0.9–3.3)
MONO#: 0.6 10*3/uL (ref 0.1–0.9)
NEUT%: 40.3 % (ref 39.6–80.0)
RDW: 12.9 % (ref 11.1–15.7)
WBC: 11 10*3/uL — ABNORMAL HIGH (ref 3.9–10.0)

## 2011-09-22 LAB — CHCC SATELLITE - SMEAR

## 2011-09-22 NOTE — Progress Notes (Signed)
CC:   Vickie Perla, DO  DIAGNOSIS:  Mild lymphocytosis.  HISTORY OF PRESENT ILLNESS:  Vickie Nichols is a very nice 56 year old white female.  She actually works for [employer].  We had a nice talk regarding Vickie Nichols.  Unfortunately her husband passed away on a motorcycle accident back probably about 10 months or so ago.  This obviously was very stressful for her.  She had stopped smoking.  She started back smoking.  She was started on antidepressants.  She wants to get off these.  She sees Dr. Loreen Freud.  Dr. Laury Axon has noted that her white cell count has been a little bit elevated.  As such, Dr. Laury Axon kindly referred her to the [facility] for an evaluation.  Going back to June of 2013 her white cell count was 12.7.  She was not anemic or thrombocytopenic.  Back in October of 2009 her white cell count was 9.5.  In August of this year, her CBC showed a white cell count of 11.4, hemoglobin 13.6, hematocrit 42.1, platelet count was 252.  Again, she has felt well.  She has been traveling with her job.  She works in Presenter, broadcasting for [employer].  She has had no fever. She has had no rashes.  She has had no weight loss or weight gain. There has been no change in bowel or bladder habits.  She has been getting her mammograms.  She has had a colonoscopy already because I think familial history of polyps.  She has had no dysphagia or odynophagia.  She has not noticed any swollen nodes.  Again, she was kindly referred over to the [facility] for an evaluation for lymphocytosis.  PAST MEDICAL HISTORY:  Remarkable for: 1. Non-insulin-dependent diabetes. 2. Situational depression.  ALLERGIES:  None.  MEDICATIONS: 1. Metformin 500 mg p.o. daily. 2. Effexor 37.5 mg p.o. daily.  SOCIAL HISTORY:  Remarkable for tobacco use.  She started back in college.  She says she has probably smoked at most a pack a day.  She had stopped.   She has started back up but is trying to stop again.  She has no alcohol use.  She has no obvious occupational exposures.  FAMILY HISTORY:  Noncontributory.  REVIEW OF SYSTEMS:  As stated in history of present illness.  No additional are findings noted on a 12 system review.  PHYSICAL EXAMINATION:  General:  This is a well-developed, well- nourished white female in no obvious distress.  Vital signs: Temperature 97.9, pulse 73, respiratory rate 20, blood pressure 141/68. Weight is 230.  Head and neck:  Normocephalic, atraumatic skull.  There are no ocular or oral lesions.  There are no palpable cervical or supraclavicular lymph nodes.  Lungs:  Clear bilaterally.  Cardiac: Regular rate and rhythm with a normal S1 and S2.  There are no murmurs, rubs or bruits.  Axillary exam:  Shows no bilateral axillary adenopathy. Abdomen:  Soft with good bowel sounds.  There is no fluid wave.  There is no guarding or rebound tenderness.  There is no palpable hepatosplenomegaly.  Back:  No tenderness over the spine, ribs or hips. Extremities:  Shows no clubbing, cyanosis or edema.  She has good range motion of her joints.  She has no erythema or warmth about her joints. She had good pulses in her distal extremities.  Neurological:  Shows no focal neurological deficits.  Skin:  No rashes, ecchymoses or petechiae.  LABORATORY STUDIES:  White cell  count 11, hemoglobin 14.2, hematocrit 42.4, platelet count 252.  Peripheral smear shows normochromic normocytic population of red blood cells.  There are no nucleated red blood cells.  I see no rouleaux formation.  There are no teardrop cells.  There are no schistocytes.  I see no target cells.  White cells are mildly increased number.  There is an increase in lymphocytes.  There are a few smudge cells.  There are a few large lymphocytes.  I do not see any granules in the lymphocytes. There are no immature myeloid cells.  There are no hypersegmented  polys. Platelets are adequate in number and size.  IMPRESSION:  Vickie Nichols is a very nice 56 year old white female with lymphocytosis.  We did send off flow cytometry on the blood.  This is going to be the only way for [place] to tell if she does have a hematologic issue.  By her blood smear, I think it is debatable whether or not there is an underlying bone marrow disorder.  By the smudge cells, one might suspect that she could have CLL.  She does have some smudge cells.  There are several atypical appearing lymphocytes.  I must say that she is awful young to have CLL.  If she does have CLL, she is totally asymptomatic with this.  She would be stage A CLL, would not need to have any therapy.  She would not even need to have a bone marrow test or any scans done.  We will get back with her once we get the results of her flow cytometry back.  Once we have the results of the flow cytometry back we will give her a call.  If the results of her flow cytometry do show a monoclonal population of cells, then I do not think we have to get her back to the clinic.  I just do not think that we would be adding to her health care.  One possibility for the lymphocytosis is her smoking.  We do see lymphocytosis in a population of women who do smoke who are on the heavier side.  We are not sure as to why this does happen but this is a definite entity.  I spent a good hour or so with Vickie Nichols.  We had a really good time talking about things.  I am praying hard for her because of the death of her husband by the motorcycle accident.  I did give Vickie Nichols a prayer blanket.  She very much appreciated this.   ______________________________ Josph Macho, M.D. PRE/MEDQ  D:  09/22/2011  T:  09/22/2011  Job:  [id number]

## 2011-09-22 NOTE — Progress Notes (Signed)
This office note has been dictated.

## 2011-09-28 LAB — FLOW CYTOMETRY - CHCC SATELLITE

## 2011-10-06 ENCOUNTER — Encounter: Payer: Self-pay | Admitting: Family Medicine

## 2011-10-06 MED ORDER — VENLAFAXINE HCL 37.5 MG PO TABS: 37.5000 mg | ORAL_TABLET | Freq: Every day | ORAL | Status: DC

## 2011-10-23 ENCOUNTER — Encounter: Payer: Self-pay | Admitting: Family Medicine

## 2011-12-25 ENCOUNTER — Telehealth: Payer: Self-pay | Admitting: Family Medicine

## 2011-12-25 MED ORDER — VENLAFAXINE HCL 37.5 MG PO TABS: 37.5000 mg | ORAL_TABLET | Freq: Every day | ORAL | Status: DC

## 2011-12-25 NOTE — Telephone Encounter (Signed)
refill Venlafaxine HCl (Tab) 37.5 MG Take 1 tablet (37.5 mg total) by mouth daily. MUST BE A 90-day supply per fax received from pharmacy, last fill not listed

## 2011-12-25 NOTE — Telephone Encounter (Signed)
Refill for Venlafaxine sent to pharmacy, #90 with 1 refill.

## 2012-02-14 ENCOUNTER — Other Ambulatory Visit: Payer: Self-pay | Admitting: Family Medicine

## 2012-02-17 ENCOUNTER — Other Ambulatory Visit: Payer: Self-pay

## 2012-02-17 NOTE — Telephone Encounter (Signed)
Rx sent to the pharmacy([facility]) by e-script.//AB/CMA

## 2012-02-19 ENCOUNTER — Other Ambulatory Visit: Payer: Self-pay | Admitting: Family Medicine

## 2012-02-19 NOTE — Telephone Encounter (Signed)
Pt insurance requires a 90 day supply of this medication.

## 2012-02-20 MED ORDER — METFORMIN HCL ER 500 MG PO TB24: ORAL_TABLET | ORAL | Status: DC

## 2012-02-20 NOTE — Addendum Note (Signed)
Addended by: Arnette Norris on: 02/20/2012 09:29 AM   Modules accepted: Orders

## 2012-03-20 ENCOUNTER — Other Ambulatory Visit: Payer: Self-pay | Admitting: Family Medicine

## 2012-03-21 ENCOUNTER — Other Ambulatory Visit: Payer: Self-pay | Admitting: Family Medicine

## 2012-06-28 ENCOUNTER — Encounter: Payer: Self-pay | Admitting: Family Medicine

## 2012-06-28 ENCOUNTER — Ambulatory Visit (INDEPENDENT_AMBULATORY_CARE_PROVIDER_SITE_OTHER): Payer: Managed Care, Other (non HMO) | Admitting: Family Medicine

## 2012-06-28 ENCOUNTER — Other Ambulatory Visit (HOSPITAL_COMMUNITY)
Admission: RE | Admit: 2012-06-28 | Discharge: 2012-06-28 | Disposition: A | Discharge status: Home / Self Care | Payer: Managed Care, Other (non HMO) | Source: Ambulatory Visit | Attending: Family Medicine | Admitting: Family Medicine

## 2012-06-28 VITALS — BP 104/70 | HR 69 | Temp 98.9°F | Ht 68.0 in | Wt 225.6 lb

## 2012-06-28 DIAGNOSIS — M25519 Pain in unspecified shoulder: Secondary | ICD-10-CM

## 2012-06-28 DIAGNOSIS — Z1211 Encounter for screening for malignant neoplasm of colon: Secondary | ICD-10-CM

## 2012-06-28 DIAGNOSIS — Z01419 Encounter for gynecological examination (general) (routine) without abnormal findings: Secondary | ICD-10-CM | POA: Insufficient documentation

## 2012-06-28 DIAGNOSIS — E118 Type 2 diabetes mellitus with unspecified complications: Secondary | ICD-10-CM

## 2012-06-28 DIAGNOSIS — E1169 Type 2 diabetes mellitus with other specified complication: Secondary | ICD-10-CM | POA: Insufficient documentation

## 2012-06-28 DIAGNOSIS — Z87891 Personal history of nicotine dependence: Secondary | ICD-10-CM

## 2012-06-28 DIAGNOSIS — E1165 Type 2 diabetes mellitus with hyperglycemia: Secondary | ICD-10-CM

## 2012-06-28 DIAGNOSIS — Z124 Encounter for screening for malignant neoplasm of cervix: Secondary | ICD-10-CM

## 2012-06-28 DIAGNOSIS — IMO0002 Reserved for concepts with insufficient information to code with codable children: Secondary | ICD-10-CM

## 2012-06-28 DIAGNOSIS — Z0000 Encounter for general adult medical examination without abnormal findings: Secondary | ICD-10-CM

## 2012-06-28 DIAGNOSIS — M25512 Pain in left shoulder: Secondary | ICD-10-CM

## 2012-06-28 DIAGNOSIS — E785 Hyperlipidemia, unspecified: Secondary | ICD-10-CM

## 2012-06-28 LAB — POCT URINALYSIS DIPSTICK
Protein, UA: NEGATIVE
Spec Grav, UA: >=1.03
Urobilinogen, UA: 0.2
pH, UA: 6

## 2012-06-28 LAB — CBC WITH DIFFERENTIAL/PLATELET
Basophils Relative: 0.4 % (ref 0.0–3.0)
Eosinophils Relative: 1.6 % (ref 0.0–5.0)
Hemoglobin: 14.1 g/dL (ref 12.0–15.0)
Lymphocytes Relative: 38.5 % (ref 12.0–46.0)
Monocytes Relative: 4.4 % (ref 3.0–12.0)
Neutro Abs: 6.3 10*3/uL (ref 1.4–7.7)
RBC: 4.52 Mil/uL (ref 3.87–5.11)
WBC: 11.4 10*3/uL — ABNORMAL HIGH (ref 4.5–10.5)

## 2012-06-28 LAB — HEPATIC FUNCTION PANEL
ALT: 43 U/L — ABNORMAL HIGH (ref 0–35)
Alkaline Phosphatase: 73 U/L (ref 39–117)
Bilirubin, Direct: 0 mg/dL (ref 0.0–0.3)
Total Protein: 7.4 g/dL (ref 6.0–8.3)

## 2012-06-28 LAB — LIPID PANEL: Total CHOL/HDL Ratio: 6

## 2012-06-28 LAB — BASIC METABOLIC PANEL
CO2: 28 mEq/L (ref 19–32)
Calcium: 9.4 mg/dL (ref 8.4–10.5)
GFR: 88.78 mL/min (ref >60.00)
Sodium: 139 mEq/L (ref 135–145)

## 2012-06-28 LAB — MICROALBUMIN / CREATININE URINE RATIO
Creatinine,U: 178.3 mg/dL
Microalb, Ur: 0.7 mg/dL (ref 0.0–1.9)

## 2012-06-28 LAB — TSH: TSH: 0.72 u[IU]/mL (ref 0.35–5.50)

## 2012-06-28 MED ORDER — METFORMIN HCL ER 500 MG PO TB24: ORAL_TABLET | ORAL | Status: DC

## 2012-06-28 NOTE — Assessment & Plan Note (Signed)
Pt never took lipitor

## 2012-06-28 NOTE — Patient Instructions (Addendum)
Preventive Care for Adults, Female A healthy lifestyle and preventive care can promote health and wellness. Preventive health guidelines for women include the following key practices.  A routine yearly physical is a good way to check with your caregiver about your health and preventive screening. It is a chance to share any concerns and updates on your health, and to receive a thorough exam.  Visit your dentist for a routine exam and preventive care every 6 months. Brush your teeth twice a day and floss once a day. Good oral hygiene prevents tooth decay and gum disease.  The frequency of eye exams is based on your age, health, family medical history, use of contact lenses, and other factors. Follow your caregiver's recommendations for frequency of eye exams.  Eat a healthy diet. Foods like vegetables, fruits, whole grains, low-fat dairy products, and lean protein foods contain the nutrients you need without too many calories. Decrease your intake of foods high in solid fats, added sugars, and salt. Eat the right amount of calories for you.Get information about a proper diet from your caregiver, if necessary.  Regular physical exercise is one of the most important things you can do for your health. Most adults should get at least 150 minutes of moderate-intensity exercise (any activity that increases your heart rate and causes you to sweat) each week. In addition, most adults need muscle-strengthening exercises on 2 or more days a week.  Maintain a healthy weight. The body mass index (BMI) is a screening tool to identify possible weight problems. It provides an estimate of body fat based on height and weight. Your caregiver can help determine your BMI, and can help you achieve or maintain a healthy weight.For adults 20 years and older:  A BMI below 18.5 is considered underweight.  A BMI of 18.5 to 24.9 is normal.  A BMI of 25 to 29.9 is considered overweight.  A BMI of 30 and above is  considered obese.  Maintain normal blood lipids and cholesterol levels by exercising and minimizing your intake of saturated fat. Eat a balanced diet with plenty of fruit and vegetables. Blood tests for lipids and cholesterol should begin at age 20 and be repeated every 5 years. If your lipid or cholesterol levels are high, you are over 50, or you are at high risk for heart disease, you may need your cholesterol levels checked more frequently.Ongoing high lipid and cholesterol levels should be treated with medicines if diet and exercise are not effective.  If you smoke, find out from your caregiver how to quit. If you do not use tobacco, do not start.  If you are pregnant, do not drink alcohol. If you are breastfeeding, be very cautious about drinking alcohol. If you are not pregnant and choose to drink alcohol, do not exceed 1 drink per day. One drink is considered to be 12 ounces (355 mL) of beer, 5 ounces (148 mL) of wine, or 1.5 ounces (44 mL) of liquor.  Avoid use of street drugs. Do not share needles with anyone. Ask for help if you need support or instructions about stopping the use of drugs.  High blood pressure causes heart disease and increases the risk of stroke. Your blood pressure should be checked at least every 1 to 2 years. Ongoing high blood pressure should be treated with medicines if weight loss and exercise are not effective.  If you are 55 to 57 years old, ask your caregiver if you should take aspirin to prevent strokes.  Diabetes   screening involves taking a blood sample to check your fasting blood sugar level. This should be done once every 3 years, after age 45, if you are within normal weight and without risk factors for diabetes. Testing should be considered at a younger age or be carried out more frequently if you are overweight and have at least 1 risk factor for diabetes.  Breast cancer screening is essential preventive care for women. You should practice "breast  self-awareness." This means understanding the normal appearance and feel of your breasts and may include breast self-examination. Any changes detected, no matter how small, should be reported to a caregiver. Women in their 20s and 30s should have a clinical breast exam (CBE) by a caregiver as part of a regular health exam every 1 to 3 years. After age 40, women should have a CBE every year. Starting at age 40, women should consider having a mammography (breast X-ray test) every year. Women who have a family history of breast cancer should talk to their caregiver about genetic screening. Women at a high risk of breast cancer should talk to their caregivers about having magnetic resonance imaging (MRI) and a mammography every year.  The Pap test is a screening test for cervical cancer. A Pap test can show cell changes on the cervix that might become cervical cancer if left untreated. A Pap test is a procedure in which cells are obtained and examined from the lower end of the uterus (cervix).  Women should have a Pap test starting at age 21.  Between ages 21 and 29, Pap tests should be repeated every 2 years.  Beginning at age 30, you should have a Pap test every 3 years as long as the past 3 Pap tests have been normal.  Some women have medical problems that increase the chance of getting cervical cancer. Talk to your caregiver about these problems. It is especially important to talk to your caregiver if a new problem develops soon after your last Pap test. In these cases, your caregiver may recommend more frequent screening and Pap tests.  The above recommendations are the same for women who have or have not gotten the vaccine for human papillomavirus (HPV).  If you had a hysterectomy for a problem that was not cancer or a condition that could lead to cancer, then you no longer need Pap tests. Even if you no longer need a Pap test, a regular exam is a good idea to make sure no other problems are  starting.  If you are between ages 65 and 70, and you have had normal Pap tests going back 10 years, you no longer need Pap tests. Even if you no longer need a Pap test, a regular exam is a good idea to make sure no other problems are starting.  If you have had past treatment for cervical cancer or a condition that could lead to cancer, you need Pap tests and screening for cancer for at least 20 years after your treatment.  If Pap tests have been discontinued, risk factors (such as a new sexual partner) need to be reassessed to determine if screening should be resumed.  The HPV test is an additional test that may be used for cervical cancer screening. The HPV test looks for the virus that can cause the cell changes on the cervix. The cells collected during the Pap test can be tested for HPV. The HPV test could be used to screen women aged 30 years and older, and should   be used in women of any age who have unclear Pap test results. After the age of 30, women should have HPV testing at the same frequency as a Pap test.  Colorectal cancer can be detected and often prevented. Most routine colorectal cancer screening begins at the age of 50 and continues through age 75. However, your caregiver may recommend screening at an earlier age if you have risk factors for colon cancer. On a yearly basis, your caregiver may provide home test kits to check for hidden blood in the stool. Use of a small camera at the end of a tube, to directly examine the colon (sigmoidoscopy or colonoscopy), can detect the earliest forms of colorectal cancer. Talk to your caregiver about this at age 50, when routine screening begins. Direct examination of the colon should be repeated every 5 to 10 years through age 75, unless early forms of pre-cancerous polyps or small growths are found.  Hepatitis C blood testing is recommended for all people born from 1945 through 1965 and any individual with known risks for hepatitis C.  Practice  safe sex. Use condoms and avoid high-risk sexual practices to reduce the spread of sexually transmitted infections (STIs). STIs include gonorrhea, chlamydia, syphilis, trichomonas, herpes, HPV, and human immunodeficiency virus (HIV). Herpes, HIV, and HPV are viral illnesses that have no cure. They can result in disability, cancer, and death. Sexually active women aged 25 and younger should be checked for chlamydia. Older women with new or multiple partners should also be tested for chlamydia. Testing for other STIs is recommended if you are sexually active and at increased risk.  Osteoporosis is a disease in which the bones lose minerals and strength with aging. This can result in serious bone fractures. The risk of osteoporosis can be identified using a bone density scan. Women ages 65 and over and women at risk for fractures or osteoporosis should discuss screening with their caregivers. Ask your caregiver whether you should take a calcium supplement or vitamin D to reduce the rate of osteoporosis.  Menopause can be associated with physical symptoms and risks. Hormone replacement therapy is available to decrease symptoms and risks. You should talk to your caregiver about whether hormone replacement therapy is right for you.  Use sunscreen with sun protection factor (SPF) of 30 or more. Apply sunscreen liberally and repeatedly throughout the day. You should seek shade when your shadow is shorter than you. Protect yourself by wearing long sleeves, pants, a wide-brimmed hat, and sunglasses year round, whenever you are outdoors.  Once a month, do a whole body skin exam, using a mirror to look at the skin on your back. Notify your caregiver of new moles, moles that have irregular borders, moles that are larger than a pencil eraser, or moles that have changed in shape or color.  Stay current with required immunizations.  Influenza. You need a dose every fall (or winter). The composition of the flu vaccine  changes each year, so being vaccinated once is not enough.  Pneumococcal polysaccharide. You need 1 to 2 doses if you smoke cigarettes or if you have certain chronic medical conditions. You need 1 dose at age 65 (or older) if you have never been vaccinated.  Tetanus, diphtheria, pertussis (Tdap, Td). Get 1 dose of Tdap vaccine if you are younger than age 65, are over 65 and have contact with an infant, are a healthcare worker, are pregnant, or simply want to be protected from whooping cough. After that, you need a Td   booster dose every 10 years. Consult your caregiver if you have not had at least 3 tetanus and diphtheria-containing shots sometime in your life or have a deep or dirty wound.  HPV. You need this vaccine if you are a woman age 26 or younger. The vaccine is given in 3 doses over 6 months.  Measles, mumps, rubella (MMR). You need at least 1 dose of MMR if you were born in 1957 or later. You may also need a second dose.  Meningococcal. If you are age 19 to 21 and a first-year college student living in a residence hall, or have one of several medical conditions, you need to get vaccinated against meningococcal disease. You may also need additional booster doses.  Zoster (shingles). If you are age 60 or older, you should get this vaccine.  Varicella (chickenpox). If you have never had chickenpox or you were vaccinated but received only 1 dose, talk to your caregiver to find out if you need this vaccine.  Hepatitis A. You need this vaccine if you have a specific risk factor for hepatitis A virus infection or you simply wish to be protected from this disease. The vaccine is usually given as 2 doses, 6 to 18 months apart.  Hepatitis B. You need this vaccine if you have a specific risk factor for hepatitis B virus infection or you simply wish to be protected from this disease. The vaccine is given in 3 doses, usually over 6 months. Preventive Services / Frequency Ages 19 to 39  Blood  pressure check.** / Every 1 to 2 years.  Lipid and cholesterol check.** / Every 5 years beginning at age 20.  Clinical breast exam.** / Every 3 years for women in their 20s and 30s.  Pap test.** / Every 2 years from ages 21 through 29. Every 3 years starting at age 30 through age 65 or 70 with a history of 3 consecutive normal Pap tests.  HPV screening.** / Every 3 years from ages 30 through ages 65 to 70 with a history of 3 consecutive normal Pap tests.  Hepatitis C blood test.** / For any individual with known risks for hepatitis C.  Skin self-exam. / Monthly.  Influenza immunization.** / Every year.  Pneumococcal polysaccharide immunization.** / 1 to 2 doses if you smoke cigarettes or if you have certain chronic medical conditions.  Tetanus, diphtheria, pertussis (Tdap, Td) immunization. / A one-time dose of Tdap vaccine. After that, you need a Td booster dose every 10 years.  HPV immunization. / 3 doses over 6 months, if you are 26 and younger.  Measles, mumps, rubella (MMR) immunization. / You need at least 1 dose of MMR if you were born in 1957 or later. You may also need a second dose.  Meningococcal immunization. / 1 dose if you are age 19 to 21 and a first-year college student living in a residence hall, or have one of several medical conditions, you need to get vaccinated against meningococcal disease. You may also need additional booster doses.  Varicella immunization.** / Consult your caregiver.  Hepatitis A immunization.** / Consult your caregiver. 2 doses, 6 to 18 months apart.  Hepatitis B immunization.** / Consult your caregiver. 3 doses usually over 6 months. Ages 40 to 64  Blood pressure check.** / Every 1 to 2 years.  Lipid and cholesterol check.** / Every 5 years beginning at age 20.  Clinical breast exam.** / Every year after age 40.  Mammogram.** / Every year beginning at age 40   and continuing for as long as you are in good health. Consult with your  caregiver.  Pap test.** / Every 3 years starting at age 30 through age 65 or 70 with a history of 3 consecutive normal Pap tests.  HPV screening.** / Every 3 years from ages 30 through ages 65 to 70 with a history of 3 consecutive normal Pap tests.  Fecal occult blood test (FOBT) of stool. / Every year beginning at age 50 and continuing until age 75. You may not need to do this test if you get a colonoscopy every 10 years.  Flexible sigmoidoscopy or colonoscopy.** / Every 5 years for a flexible sigmoidoscopy or every 10 years for a colonoscopy beginning at age 50 and continuing until age 75.  Hepatitis C blood test.** / For all people born from 1945 through 1965 and any individual with known risks for hepatitis C.  Skin self-exam. / Monthly.  Influenza immunization.** / Every year.  Pneumococcal polysaccharide immunization.** / 1 to 2 doses if you smoke cigarettes or if you have certain chronic medical conditions.  Tetanus, diphtheria, pertussis (Tdap, Td) immunization.** / A one-time dose of Tdap vaccine. After that, you need a Td booster dose every 10 years.  Measles, mumps, rubella (MMR) immunization. / You need at least 1 dose of MMR if you were born in 1957 or later. You may also need a second dose.  Varicella immunization.** / Consult your caregiver.  Meningococcal immunization.** / Consult your caregiver.  Hepatitis A immunization.** / Consult your caregiver. 2 doses, 6 to 18 months apart.  Hepatitis B immunization.** / Consult your caregiver. 3 doses, usually over 6 months. Ages 65 and over  Blood pressure check.** / Every 1 to 2 years.  Lipid and cholesterol check.** / Every 5 years beginning at age 20.  Clinical breast exam.** / Every year after age 40.  Mammogram.** / Every year beginning at age 40 and continuing for as long as you are in good health. Consult with your caregiver.  Pap test.** / Every 3 years starting at age 30 through age 65 or 70 with a 3  consecutive normal Pap tests. Testing can be stopped between 65 and 70 with 3 consecutive normal Pap tests and no abnormal Pap or HPV tests in the past 10 years.  HPV screening.** / Every 3 years from ages 30 through ages 65 or 70 with a history of 3 consecutive normal Pap tests. Testing can be stopped between 65 and 70 with 3 consecutive normal Pap tests and no abnormal Pap or HPV tests in the past 10 years.  Fecal occult blood test (FOBT) of stool. / Every year beginning at age 50 and continuing until age 75. You may not need to do this test if you get a colonoscopy every 10 years.  Flexible sigmoidoscopy or colonoscopy.** / Every 5 years for a flexible sigmoidoscopy or every 10 years for a colonoscopy beginning at age 50 and continuing until age 75.  Hepatitis C blood test.** / For all people born from 1945 through 1965 and any individual with known risks for hepatitis C.  Osteoporosis screening.** / A one-time screening for women ages 65 and over and women at risk for fractures or osteoporosis.  Skin self-exam. / Monthly.  Influenza immunization.** / Every year.  Pneumococcal polysaccharide immunization.** / 1 dose at age 65 (or older) if you have never been vaccinated.  Tetanus, diphtheria, pertussis (Tdap, Td) immunization. / A one-time dose of Tdap vaccine if you are over   65 and have contact with an infant, are a healthcare worker, or simply want to be protected from whooping cough. After that, you need a Td booster dose every 10 years.  Varicella immunization.** / Consult your caregiver.  Meningococcal immunization.** / Consult your caregiver.  Hepatitis A immunization.** / Consult your caregiver. 2 doses, 6 to 18 months apart.  Hepatitis B immunization.** / Check with your caregiver. 3 doses, usually over 6 months. ** Family history and personal history of risk and conditions may change your caregiver's recommendations. Document Released: 02/14/2001 Document Revised: 03/13/2011  Document Reviewed: 05/16/2010 [employer] Patient Information 2014 [employer].  

## 2012-06-28 NOTE — Progress Notes (Signed)
Subjective:     Vickie Nichols is a 57 y.o. female and is here for a comprehensive physical exam. The patient reports problems - pt is not taking metformin-- she didn't realize she was supposed to stay on it.Marland Kitchen  History   Social History  . Marital Status: Widowed    Spouse Name: N/A    Number of Children: N/A  . Years of Education: N/A   Occupational History  . Not on file.   Social History Main Topics  . Smoking status: Current Every Day Smoker -- 0.50 packs/day    Types: Cigarettes    Last Attempt to Quit: 04/26/2010  . Smokeless tobacco: Never Used     Comment: started smoking again when she moved  . Alcohol Use: No  . Drug Use: No  . Sexually Active: Not Currently   Other Topics Concern  . Not on file   Social History Narrative  . No narrative on file   Health Maintenance  Topic Date Due  . Pneumococcal Polysaccharide Vaccine (#1) 08/14/1957  . Ophthalmology Exam  01/15/2012  . Foot Exam  06/14/2012  . Urine Microalbumin  06/14/2012  . Influenza Vaccine  09/02/2012  . Mammogram  08/29/2013  . Pap Smear  06/15/2014  . Colonoscopy  09/02/2018  . Tetanus/tdap  06/14/2021    The following portions of the patient's history were reviewed and updated as appropriate:  She  has a past medical history of Postmenopausal; Anxiety; and Depression. She  does not have any pertinent problems on file. She  has past surgical history that includes Cesarean section; Tubal ligation; and Tonsillectomy. Her family history includes Cancer in her maternal aunt, maternal grandfather, and maternal grandmother; Coronary artery disease (age of onset: 53) in an unspecified family member; Diabetes in her paternal grandfather; Hypertension in her paternal grandfather; Lung disease in her father; Obesity in her brother; and Stroke in her paternal grandfather. She  reports that she has been smoking Cigarettes.  She has been smoking about 0.50 packs per day. She has never used smokeless  tobacco. She reports that she does not drink alcohol or use illicit drugs. She has a current medication list which includes the following prescription(s): metformin. No current outpatient prescriptions on file prior to visit.   No current facility-administered medications on file prior to visit.   She has No Known Allergies..  Review of Systems Review of Systems  Constitutional: Negative for activity change, appetite change and fatigue.  HENT: Negative for hearing loss, congestion, tinnitus and ear discharge.  dentist q63m Eyes: Negative for visual disturbance (see optho ----1 1/2 years ago Respiratory: Negative for cough, chest tightness and shortness of breath.   Cardiovascular: Negative for chest pain, palpitations and leg swelling.  Gastrointestinal: Negative for abdominal pain, diarrhea, constipation and abdominal distention.  Genitourinary: Negative for urgency, frequency, decreased urine volume and difficulty urinating.  Musculoskeletal: Negative for back pain, + Left shoulder pain and pain back R heel Skin: Negative for color change, pallor and rash.  Neurological: Negative for dizziness, light-headedness, numbness and headaches.  Hematological: Negative for adenopathy. Does not bruise/bleed easily.  Psychiatric/Behavioral: Negative for suicidal ideas, confusion, sleep disturbance, self-injury, dysphoric mood, decreased concentration and agitation.       Objective:    BP 104/70  Pulse 69  Temp(Src) 98.9 F (37.2 C) (Oral)  Ht 5\' 8"  (1.727 m)  Wt 225 lb 9.6 oz (102.331 kg)  BMI 34.31 kg/m2  SpO2 99% General appearance: alert, cooperative, appears stated age and no  distress Head: Normocephalic, without obvious abnormality, atraumatic Eyes: negative findings: lids and lashes normal and pupils equal, round, reactive to light and accomodation Ears: normal TM's and external ear canals both ears Nose: Nares normal. Septum midline. Mucosa normal. No drainage or sinus  tenderness. Throat: lips, mucosa, and tongue normal; teeth and gums normal Neck: no adenopathy, no carotid bruit, no JVD, supple, symmetrical, trachea midline and thyroid not enlarged, symmetric, no tenderness/mass/nodules Back: symmetric, no curvature. ROM normal. No CVA tenderness. Lungs: clear to auscultation bilaterally Breasts: normal appearance, no masses or tenderness Heart: regular rate and rhythm, S1, S2 normal, no murmur, click, rub or gallop Abdomen: soft, non-tender; bowel sounds normal; no masses,  no organomegaly Pelvic: cervix normal in appearance, external genitalia normal, no adnexal masses or tenderness, no cervical motion tenderness, rectovaginal septum normal, uterus normal size, shape, and consistency, vagina normal without discharge and pap done Extremities: extremities normal, atraumatic, no cyanosis or edema Pulses: 2+ and symmetric Skin: Skin color, texture, turgor normal. No rashes or lesions Lymph nodes: Cervical, supraclavicular, and axillary nodes normal. Neurologic: Alert and oriented X 3, normal strength and tone. Normal symmetric reflexes. Normal coordination and gait Psych-- no depression, no anxiety      Assessment:    Healthy female exam.      Plan:    ghm utd Check labs See After Visit Summary for Counseling Recommendations

## 2012-06-28 NOTE — Assessment & Plan Note (Signed)
Check labs  Restart metformin Pt will find out what meter her ins will pay for and let us know

## 2012-06-28 NOTE — Assessment & Plan Note (Signed)
Pt does not want to quit  

## 2012-07-08 ENCOUNTER — Other Ambulatory Visit: Payer: Self-pay

## 2012-07-08 MED ORDER — ATORVASTATIN CALCIUM 10 MG PO TABS: 10.0000 mg | ORAL_TABLET | Freq: Every day | ORAL | Status: DC

## 2012-07-11 ENCOUNTER — Other Ambulatory Visit: Payer: Self-pay

## 2012-09-03 LAB — HM DIABETES EYE EXAM

## 2012-10-07 ENCOUNTER — Other Ambulatory Visit: Payer: Self-pay | Admitting: Family Medicine

## 2012-10-08 ENCOUNTER — Ambulatory Visit (INDEPENDENT_AMBULATORY_CARE_PROVIDER_SITE_OTHER): Payer: Managed Care, Other (non HMO) | Admitting: Family Medicine

## 2012-10-08 ENCOUNTER — Encounter: Payer: Self-pay | Admitting: Family Medicine

## 2012-10-08 VITALS — BP 110/72 | HR 82 | Temp 98.6°F | Wt 219.0 lb

## 2012-10-08 DIAGNOSIS — E1159 Type 2 diabetes mellitus with other circulatory complications: Secondary | ICD-10-CM

## 2012-10-08 DIAGNOSIS — E1165 Type 2 diabetes mellitus with hyperglycemia: Secondary | ICD-10-CM

## 2012-10-08 DIAGNOSIS — H10029 Other mucopurulent conjunctivitis, unspecified eye: Secondary | ICD-10-CM

## 2012-10-08 DIAGNOSIS — H10022 Other mucopurulent conjunctivitis, left eye: Secondary | ICD-10-CM

## 2012-10-08 MED ORDER — MOXIFLOXACIN HCL 0.5 % OP SOLN: 1.0000 [drp] | Freq: Three times a day (TID) | OPHTHALMIC | Status: DC

## 2012-10-08 MED ORDER — SITAGLIPTIN PHOSPHATE 100 MG PO TABS: 100.0000 mg | ORAL_TABLET | Freq: Every day | ORAL | Status: DC

## 2012-10-08 NOTE — Assessment & Plan Note (Signed)
Pt states metformin caused numbness in arm and when she stopped it it went away D/c metformin and change to januvia 100 mg 1 po qd

## 2012-10-08 NOTE — Patient Instructions (Addendum)
Bacterial Conjunctivitis  Bacterial conjunctivitis, commonly called pink eye, is an inflammation of the clear membrane that covers the white part of the eye (conjunctiva). The inflammation can also happen on the underside of the eyelids. The blood vessels in the conjunctiva become inflamed causing the eye to become red or pink. Bacterial conjunctivitis may spread easily from one eye to another and from person to person (contagious).   CAUSES   Bacterial conjunctivitis is caused by bacteria. The bacteria may come from your own skin, your upper respiratory tract, or from someone else with bacterial conjunctivitis.  SYMPTOMS   The normally white color of the eye or the underside of the eyelid is usually pink or red. The pink eye is usually associated with irritation, tearing, and some sensitivity to light. Bacterial conjunctivitis is often associated with a thick, yellowish discharge from the eye. The discharge may turn into a crust on the eyelids overnight, which causes your eyelids to stick together. If a discharge is present, there may also be some blurred vision in the affected eye.  DIAGNOSIS   Bacterial conjunctivitis is diagnosed by your caregiver through an eye exam and the symptoms that you report. Your caregiver looks for changes in the surface tissues of your eyes, which may point to the specific type of conjunctivitis. A sample of any discharge may be collected on a cotton-tip swab if you have a severe case of conjunctivitis, if your cornea is affected, or if you keep getting repeat infections that do not respond to treatment. The sample will be sent to a lab to see if the inflammation is caused by a bacterial infection and to see if the infection will respond to antibiotic medicines.  TREATMENT   · Bacterial conjunctivitis is treated with antibiotics. Antibiotic eyedrops are most often used. However, antibiotic ointments are also available. Antibiotics pills are sometimes used. Artificial tears or eye  washes may ease discomfort.  HOME CARE INSTRUCTIONS   · To ease discomfort, apply a cool, clean wash cloth to your eye for 10 20 minutes, 3 4 times a day.  · Gently wipe away any drainage from your eye with a warm, wet washcloth or a cotton ball.  · Wash your hands often with soap and water. Use paper towels to dry your hands.  · Do not share towels or wash cloths. This may spread the infection.  · Change or wash your pillow case every day.  · You should not use eye makeup until the infection is gone.  · Do not operate machinery or drive if your vision is blurred.  · Stop using contacts lenses. Ask your caregiver how to sterilize or replace your contacts before using them again. This depends on the type of contact lenses that you use.  · When applying medicine to the infected eye, do not touch the edge of your eyelid with the eyedrop bottle or ointment tube.  SEEK IMMEDIATE MEDICAL CARE IF:   · Your infection has not improved within 3 days after beginning treatment.  · You had yellow discharge from your eye and it returns.  · You have increased eye pain.  · Your eye redness is spreading.  · Your vision becomes blurred.  · You have a fever or persistent symptoms for more than 2 3 days.  · You have a fever and your symptoms suddenly get worse.  · You have facial pain, redness, or swelling.  MAKE SURE YOU:   · Understand these instructions.  · Will watch your   condition.  · Will get help right away if you are not doing well or get worse.  Document Released: 12/19/2004 Document Revised: 09/13/2011 Document Reviewed: 05/22/2011  [employer]® Patient Information ©2014 [employer].

## 2012-10-08 NOTE — Progress Notes (Signed)
  Subjective:    Vickie Nichols is a 57 y.o. female who presents for evaluation of erythema, itching and tearing in the left eye. She has noticed the above symptoms for a few days. Onset was gradual. Patient denies blurred vision, foreign body sensation, pain, photophobia and visual field deficit. There is a history of wearing glasses.  The following portions of the patient's history were reviewed and updated as appropriate: allergies, current medications, past family history, past medical history, past social history, past surgical history and problem list.  Review of Systems Pertinent items are noted in HPI.   Objective:    BP 110/72  Pulse 82  Temp(Src) 98.6 F (37 C) (Oral)  Wt 219 lb (99.338 kg)  BMI 33.31 kg/m2  SpO2 95%      General: alert, cooperative, appears stated age and no distress  Eyes:  positive findings: sclera injected L > R , tearing   Vision: Not performed  Fluorescein:  not done     Assessment:    Acute conjunctivitis   Plan:    Discussed the diagnosis and proper care of conjunctivitis.  Stressed household Presenter, broadcasting. Ophthalmic drops per orders. Warm compress to eye(s). Local eye care discussed.

## 2012-10-11 ENCOUNTER — Other Ambulatory Visit (INDEPENDENT_AMBULATORY_CARE_PROVIDER_SITE_OTHER): Payer: Managed Care, Other (non HMO)

## 2012-10-11 DIAGNOSIS — E1159 Type 2 diabetes mellitus with other circulatory complications: Secondary | ICD-10-CM

## 2012-10-11 LAB — HEPATIC FUNCTION PANEL
ALT: 11 U/L (ref 0–35)
Alkaline Phosphatase: 81 U/L (ref 39–117)
Bilirubin, Direct: 0 mg/dL (ref 0.0–0.3)
Total Bilirubin: 0.6 mg/dL (ref 0.3–1.2)

## 2012-10-11 LAB — CBC WITH DIFFERENTIAL/PLATELET
Basophils Relative: 0.3 % (ref 0.0–3.0)
Eosinophils Absolute: 0.1 10*3/uL (ref 0.0–0.7)
Eosinophils Relative: 1.1 % (ref 0.0–5.0)
Hemoglobin: 14.3 g/dL (ref 12.0–15.0)
Lymphocytes Relative: 33.2 % (ref 12.0–46.0)
MCV: 91.2 fl (ref 78.0–100.0)
Monocytes Absolute: 0.5 10*3/uL (ref 0.1–1.0)
Neutrophils Relative %: 61 % (ref 43.0–77.0)
Platelets: 232 10*3/uL (ref 150.0–400.0)
WBC: 10.5 10*3/uL (ref 4.5–10.5)

## 2012-10-11 LAB — POCT URINALYSIS DIPSTICK
Blood, UA: NEGATIVE
Nitrite, UA: NEGATIVE
Protein, UA: NEGATIVE
Spec Grav, UA: 1.02
Urobilinogen, UA: 0.2

## 2012-10-11 LAB — LIPID PANEL
Cholesterol: 180 mg/dL (ref 0–200)
HDL: 37.1 mg/dL — ABNORMAL LOW (ref >39.00)
LDL Cholesterol: 121 mg/dL — ABNORMAL HIGH (ref 0–99)
Triglycerides: 109 mg/dL (ref 0.0–149.0)
VLDL: 21.8 mg/dL (ref 0.0–40.0)

## 2012-10-11 LAB — BASIC METABOLIC PANEL
BUN: 9 mg/dL (ref 6–23)
Chloride: 102 mEq/L (ref 96–112)
Creatinine, Ser: 0.9 mg/dL (ref 0.4–1.2)

## 2012-10-11 LAB — HEMOGLOBIN A1C: Hgb A1c MFr Bld: 6.9 % — ABNORMAL HIGH (ref 4.6–6.5)

## 2012-10-18 ENCOUNTER — Other Ambulatory Visit: Payer: Self-pay

## 2012-10-18 MED ORDER — ATORVASTATIN CALCIUM 10 MG PO TABS: 10.0000 mg | ORAL_TABLET | Freq: Every day | ORAL | Status: DC

## 2012-11-07 ENCOUNTER — Other Ambulatory Visit: Payer: Self-pay

## 2012-11-18 ENCOUNTER — Encounter: Payer: Self-pay | Admitting: Family Medicine

## 2012-11-18 ENCOUNTER — Ambulatory Visit (INDEPENDENT_AMBULATORY_CARE_PROVIDER_SITE_OTHER): Payer: Managed Care, Other (non HMO) | Admitting: Family Medicine

## 2012-11-18 VITALS — BP 130/80 | HR 90 | Temp 98.2°F | Resp 16 | Wt 218.0 lb

## 2012-11-18 DIAGNOSIS — R350 Frequency of micturition: Secondary | ICD-10-CM | POA: Insufficient documentation

## 2012-11-18 LAB — POCT URINALYSIS DIPSTICK
Glucose, UA: NEGATIVE
Spec Grav, UA: >=1.03
pH, UA: 6

## 2012-11-18 MED ORDER — CEPHALEXIN 500 MG PO CAPS: 500.0000 mg | ORAL_CAPSULE | Freq: Two times a day (BID) | ORAL | Status: AC

## 2012-11-18 NOTE — Assessment & Plan Note (Signed)
New.  Pt's sxs and UA consistent w/ infxn.  Start abx.  Reviewed supportive care and red flags that should prompt return.  Pt expressed understanding and is in agreement w/ plan.  

## 2012-11-18 NOTE — Patient Instructions (Signed)
Follow up as needed Start the Keflex twice daily Drink plenty of fluids We'll notify you of your culture results Call with any questions or concerns Happy Thanksgiving!!!

## 2012-11-18 NOTE — Progress Notes (Signed)
  Subjective:    Patient ID: Vickie Nichols, female    DOB: [birth year], 57 y.o.   MRN: [id number]  HPI Pre visit review using our clinic review tool, if applicable. No additional management support is needed unless otherwise documented below in the visit note.  UTI- sxs started Thursday or Friday w/ pulling sensation after urination.  Now having frequency, hesitancy, incomplete emptying.  No dysuria, no hematuria.  No fevers, back pain, suprapubic pressure.  Hx of URI 'years ago'.   Review of Systems For ROS see HPI     Objective:   Physical Exam  Vitals reviewed. Constitutional: She appears well-developed and well-nourished. No distress.  Abdominal: Soft. She exhibits no distension. There is no tenderness (no suprapubic or CVA tenderness).          Assessment & Plan:

## 2012-11-20 LAB — URINE CULTURE: Colony Count: >=100000

## 2012-11-29 ENCOUNTER — Other Ambulatory Visit: Payer: Self-pay

## 2012-11-29 MED ORDER — ATORVASTATIN CALCIUM 10 MG PO TABS: 10.0000 mg | ORAL_TABLET | Freq: Every day | ORAL | Status: DC

## 2013-02-05 ENCOUNTER — Other Ambulatory Visit: Payer: Self-pay | Admitting: Family Medicine

## 2013-02-06 ENCOUNTER — Other Ambulatory Visit: Payer: Self-pay | Admitting: Family Medicine

## 2013-02-10 ENCOUNTER — Other Ambulatory Visit: Payer: Self-pay | Admitting: Family Medicine

## 2013-02-10 NOTE — Telephone Encounter (Signed)
Med filled.  

## 2013-02-11 ENCOUNTER — Other Ambulatory Visit: Payer: Self-pay

## 2013-02-11 MED ORDER — SITAGLIPTIN PHOSPHATE 100 MG PO TABS: ORAL_TABLET | ORAL | Status: DC

## 2013-05-23 ENCOUNTER — Other Ambulatory Visit: Payer: Self-pay | Admitting: Family Medicine

## 2013-06-10 ENCOUNTER — Encounter: Payer: Self-pay | Admitting: Gastroenterology

## 2013-06-26 ENCOUNTER — Encounter: Payer: Self-pay | Admitting: Gastroenterology

## 2013-08-15 ENCOUNTER — Other Ambulatory Visit: Payer: Self-pay

## 2013-08-15 MED ORDER — SITAGLIPTIN PHOSPHATE 100 MG PO TABS: ORAL_TABLET | ORAL | Status: DC

## 2013-08-25 ENCOUNTER — Encounter: Payer: Self-pay | Admitting: General Practice

## 2013-08-29 ENCOUNTER — Ambulatory Visit (AMBULATORY_SURGERY_CENTER): Payer: Self-pay | Admitting: *Deleted

## 2013-08-29 VITALS — Ht 69.0 in | Wt 238.2 lb

## 2013-08-29 DIAGNOSIS — Z86010 Personal history of colonic polyps: Secondary | ICD-10-CM

## 2013-08-29 MED ORDER — MOVIPREP 100 G PO SOLR: 1.0000 | Freq: Once | ORAL | Status: DC

## 2013-08-29 NOTE — Progress Notes (Signed)
Denies allergies to eggs or soy products. Denies complications with sedation or anesthesia. Denies O2 use. Denies use of diet or weight loss medications.  Emmi instructions given for colonoscopy.  

## 2013-09-12 ENCOUNTER — Ambulatory Visit (AMBULATORY_SURGERY_CENTER): Payer: Managed Care, Other (non HMO) | Admitting: Gastroenterology

## 2013-09-12 ENCOUNTER — Encounter: Payer: Self-pay | Admitting: Gastroenterology

## 2013-09-12 VITALS — BP 133/66 | HR 72 | Temp 98.2°F | Resp 17 | Ht 69.0 in | Wt 238.0 lb

## 2013-09-12 DIAGNOSIS — Z86010 Personal history of colonic polyps: Secondary | ICD-10-CM

## 2013-09-12 DIAGNOSIS — D129 Benign neoplasm of anus and anal canal: Secondary | ICD-10-CM

## 2013-09-12 DIAGNOSIS — D126 Benign neoplasm of colon, unspecified: Secondary | ICD-10-CM

## 2013-09-12 DIAGNOSIS — D123 Benign neoplasm of transverse colon: Secondary | ICD-10-CM

## 2013-09-12 DIAGNOSIS — D128 Benign neoplasm of rectum: Secondary | ICD-10-CM

## 2013-09-12 MED ORDER — SODIUM CHLORIDE 0.9 % IV SOLN: 500.0000 mL | INTRAVENOUS | Status: DC

## 2013-09-12 NOTE — Op Note (Signed)
[facility]  [address] [place], [address]   COLONOSCOPY PROCEDURE REPORT PATIENT: Vickie Nichols, Vickie Nichols  MR#: [id number] BIRTHDATE: 03-19-1955 , 30  yrs. old GENDER: Female ENDOSCOPIST: Ladene Artist, MD, [facility] PROCEDURE DATE:  09/12/2013 PROCEDURE:   Colonoscopy with biopsy and snare polypectomy First Screening Colonoscopy - Avg.  risk and is 50 yrs.  old or older - No.  Prior Negative Screening - Now for repeat screening. N/A  History of Adenoma - Now for follow-up colonoscopy & has been > or = to 3 yrs.  Yes hx of adenoma.  Has been 3 or more years since last colonoscopy.  Polyps Removed Today? Yes. ASA CLASS:   Class II INDICATIONS:Patient's personal history of adenomatous colon polyps and Patient's family history of colon polyps. MEDICATIONS: MAC sedation, administered by CRNA and propofol (Diprivan) 400mg  IV DESCRIPTION OF PROCEDURE:   After the risks benefits and alternatives of the procedure were thoroughly explained, informed consent was obtained.  A digital rectal exam revealed no abnormalities of the rectum.   The LB [id number]  endoscope was introduced through the anus and advanced to the cecum, which was identified by both the appendix and ileocecal valve. No adverse events experienced.   The quality of the prep was good, using MoviPrep  The instrument was then slowly withdrawn as the colon was fully examined.  COLON FINDINGS: A sessile polyp measuring 4 mm in size was found in the transverse colon.  A polypectomy was performed with cold forceps.  The resection was complete and the polyp tissue was completely retrieved.   A sessile polyp measuring 7 mm in size was found in the rectum.  A polypectomy was performed with a cold snare.  The resection was complete and the polyp tissue was completely retrieved.   Moderate diverticulosis was noted in the descending colon and sigmoid colon.   The colon was otherwise normal.  There was no  diverticulosis, inflammation, polyps or cancers unless previously stated.  Retroflexed views revealed small internal hemorrhoids. The time to cecum=2 minutes 12 seconds. Withdrawal time=11 minutes 38 seconds.  The scope was withdrawn and the procedure completed. COMPLICATIONS: There were no complications.  ENDOSCOPIC IMPRESSION: 1.   Sessile polyp measuring 4 mm in the transverse colon; polypectomy performed with cold forceps 2.   Sessile polyp measuring 7 mm in the rectum; polypectomy performed with a cold snare 3.   Moderate diverticulosis in the descending colon and sigmoid colon 4.   Small internal hemorrhoids  RECOMMENDATIONS: 1.  Await pathology results 2.  High fiber diet with liberal fluid intake. 3.  Repeat Colonoscopy in 5 years.  eSigned:  Ladene Artist, MD, [facility] 09/12/2013 10:04 AM

## 2013-09-12 NOTE — Progress Notes (Signed)
Called to room to assist during endoscopic procedure.  Patient ID and intended procedure confirmed with present staff. Received instructions for my participation in the procedure from the performing physician.  

## 2013-09-12 NOTE — Progress Notes (Signed)
A/ox3, pleased with MAC, report to RN 

## 2013-09-12 NOTE — Patient Instructions (Signed)
YOU HAD AN ENDOSCOPIC PROCEDURE TODAY AT THE [facility]: Refer to the procedure report that was given to you for any specific questions about what was found during the examination.  If the procedure report does not answer your questions, please call your gastroenterologist to clarify.  If you requested that your care partner not be given the details of your procedure findings, then the procedure report has been included in a sealed envelope for you to review at your convenience later.  YOU SHOULD EXPECT: Some feelings of bloating in the abdomen. Passage of more gas than usual.  Walking can help get rid of the air that was put into your GI tract during the procedure and reduce the bloating. If you had a lower endoscopy (such as a colonoscopy or flexible sigmoidoscopy) you may notice spotting of blood in your stool or on the toilet paper. If you underwent a bowel prep for your procedure, then you may not have a normal bowel movement for a few days.  DIET: Your first meal following the procedure should be a light meal and then it is ok to progress to your normal diet.  A half-sandwich or bowl of soup is an example of a good first meal.  Heavy or fried foods are harder to digest and may make you feel nauseous or bloated.  Likewise meals heavy in dairy and vegetables can cause extra gas to form and this can also increase the bloating.  Drink plenty of fluids but you should avoid alcoholic beverages for 24 hours.  ACTIVITY: Your care partner should take you home directly after the procedure.  You should plan to take it easy, moving slowly for the rest of the day.  You can resume normal activity the day after the procedure however you should NOT DRIVE or use heavy machinery for 24 hours (because of the sedation medicines used during the test).    SYMPTOMS TO REPORT IMMEDIATELY: A gastroenterologist can be reached at any hour.  During normal business hours, 8:30 AM to 5:00 PM Monday through Friday,  call [phone].  After hours and on weekends, please call the GI answering service at [phone] who will take a message and have the physician on call contact you.   Following lower endoscopy (colonoscopy or flexible sigmoidoscopy):  Excessive amounts of blood in the stool  Significant tenderness or worsening of abdominal pains  Swelling of the abdomen that is new, acute  Fever of 100F or higher    FOLLOW UP: If any biopsies were taken you will be contacted by phone or by letter within the next 1-3 weeks.  Call your gastroenterologist if you have not heard about the biopsies in 3 weeks.  Our staff will call the home number listed on your records the next business day following your procedure to check on you and address any questions or concerns that you may have at that time regarding the information given to you following your procedure. This is a courtesy call and so if there is no answer at the home number and we have not heard from you through the emergency physician on call, we will assume that you have returned to your regular daily activities without incident.  SIGNATURES/CONFIDENTIALITY: You and/or your care partner have signed paperwork which will be entered into your electronic medical record.  These signatures attest to the fact that that the information above on your After Visit Summary has been reviewed and is understood.  Full responsibility of the confidentiality   of this discharge information lies with you and/or your care-partner.     

## 2013-09-15 ENCOUNTER — Telehealth: Payer: Self-pay | Admitting: *Deleted

## 2013-09-15 NOTE — Telephone Encounter (Signed)
  Follow up Call-  Call back number 09/12/2013  Post procedure Call Back phone  # [phone]  Permission to leave phone message Yes     Patient questions:  Do you have a fever, pain , or abdominal swelling? No. Pain Score  0 *  Have you tolerated food without any problems? Yes.    Have you been able to return to your normal activities? Yes.    Do you have any questions about your discharge instructions: Diet   No. Medications  No. Follow up visit  No.  Do you have questions or concerns about your Care? No.  Actions: * If pain score is 4 or above: No action needed, pain <4.

## 2013-09-16 ENCOUNTER — Encounter: Payer: Self-pay | Admitting: Gastroenterology

## 2013-09-18 ENCOUNTER — Encounter: Payer: Self-pay | Admitting: Gastroenterology

## 2013-10-17 ENCOUNTER — Other Ambulatory Visit: Payer: Self-pay

## 2013-11-17 ENCOUNTER — Telehealth: Payer: Self-pay | Admitting: Family Medicine

## 2013-11-17 MED ORDER — SITAGLIPTIN PHOSPHATE 100 MG PO TABS: ORAL_TABLET | ORAL | Status: DC

## 2013-11-17 NOTE — Telephone Encounter (Signed)
Caller name: margarett Relation to pt: self Call back number: [phone] Pharmacy: [facility] on [address]  Reason for call:   Patient scheduled appointment for 12/1 and says that she will be out of her medication before then and wants to know if we will refill one more time. She states that her insurance only covers 90 day supply. Januvia.

## 2013-12-02 ENCOUNTER — Encounter: Payer: Self-pay | Admitting: Family Medicine

## 2013-12-02 ENCOUNTER — Ambulatory Visit (INDEPENDENT_AMBULATORY_CARE_PROVIDER_SITE_OTHER): Payer: Managed Care, Other (non HMO) | Admitting: Family Medicine

## 2013-12-02 VITALS — BP 124/82 | HR 74 | Temp 98.6°F | Wt 238.2 lb

## 2013-12-02 DIAGNOSIS — Z23 Encounter for immunization: Secondary | ICD-10-CM

## 2013-12-02 DIAGNOSIS — F329 Major depressive disorder, single episode, unspecified: Secondary | ICD-10-CM

## 2013-12-02 DIAGNOSIS — E119 Type 2 diabetes mellitus without complications: Secondary | ICD-10-CM

## 2013-12-02 DIAGNOSIS — E785 Hyperlipidemia, unspecified: Secondary | ICD-10-CM

## 2013-12-02 DIAGNOSIS — F32A Depression, unspecified: Secondary | ICD-10-CM

## 2013-12-02 MED ORDER — BUPROPION HCL ER (XL) 150 MG PO TB24: ORAL_TABLET | ORAL | Status: DC

## 2013-12-02 MED ORDER — GLUCOSE BLOOD VI STRP: ORAL_STRIP | Status: DC

## 2013-12-02 MED ORDER — ONETOUCH DELICA LANCETS 33G MISC: Status: DC

## 2013-12-02 NOTE — Progress Notes (Signed)
Pre visit review using our clinic review tool, if applicable. No additional management support is needed unless otherwise documented below in the visit note. 

## 2013-12-02 NOTE — Patient Instructions (Signed)

## 2013-12-03 NOTE — Progress Notes (Signed)
   Subjective:    Patient ID: Vickie Nichols, female    DOB: [birth year], 58 y.o.   MRN: [id number]  HPI Pt is here to f/u but is not checking bld sugar.  She states she just doesn't care.  She is still struggling with the death of her husband.  Crying a lot.  She is not taking meds and not in counseling   Review of Systems As above    Objective:   Physical Exam  BP 124/82 mmHg  Pulse 74  Temp(Src) 98.6 F (37 C) (Oral)  Wt 238 lb 3.2 oz (108.047 kg)  SpO2 97% General appearance: alert, cooperative, appears stated age and no distress Throat: lips, mucosa, and tongue normal; teeth and gums normal Neck: no adenopathy, no carotid bruit, no JVD, supple, symmetrical, trachea midline and thyroid not enlarged, symmetric, no tenderness/mass/nodules Lungs: clear to auscultation bilaterally Heart: regular rate and rhythm, S1, S2 normal, no murmur, click, rub or gallop Extremities: extremities normal, atraumatic, no cyanosis or edema  Sensory exam of the foot is normal, tested with the monofilament. Good pulses, no lesions or ulcers, good peripheral pulses.        Assessment & Plan:  1. DM II (diabetes mellitus, type II), controlled Check labs,  Encouraged pt to check bld sugars - Hemoglobin A1c; Future - Microalbumin / creatinine urine ratio; Future - POCT urinalysis dipstick; Future - Basic metabolic panel; Future  2. Hyperlipidemia Check labs - Hepatic function panel; Future - Lipid panel; Future  3. Depression Pt is not improving --- she cries a lot and is still struggling the the death of her husband.   - buPROPion (WELLBUTRIN XL) 150 MG 24 hr tablet; 1 po qam x 1 week then increase to 2 po qam  Dispense: 60 tablet; Refill: 0  4. Need for prophylactic vaccination against Streptococcus pneumoniae (pneumococcus)  - Pneumococcal polysaccharide vaccine 23-valent greater than or equal to 2yo subcutaneous/IM

## 2013-12-09 ENCOUNTER — Other Ambulatory Visit (INDEPENDENT_AMBULATORY_CARE_PROVIDER_SITE_OTHER): Payer: Managed Care, Other (non HMO)

## 2013-12-09 DIAGNOSIS — E785 Hyperlipidemia, unspecified: Secondary | ICD-10-CM

## 2013-12-09 DIAGNOSIS — E119 Type 2 diabetes mellitus without complications: Secondary | ICD-10-CM

## 2013-12-09 LAB — BASIC METABOLIC PANEL
BUN: 11 mg/dL (ref 6–23)
CALCIUM: 9 mg/dL (ref 8.4–10.5)
CO2: 28 mEq/L (ref 19–32)
Chloride: 104 mEq/L (ref 96–112)
Creatinine, Ser: 0.8 mg/dL (ref 0.4–1.2)
GFR: 77.1 mL/min (ref >60.00)
Glucose, Bld: 140 mg/dL — ABNORMAL HIGH (ref 70–99)
Potassium: 4.2 mEq/L (ref 3.5–5.1)
Sodium: 139 mEq/L (ref 135–145)

## 2013-12-09 LAB — HEPATIC FUNCTION PANEL
ALT: 49 U/L — AB (ref 0–35)
AST: 47 U/L — AB (ref 0–37)
Albumin: 4.1 g/dL (ref 3.5–5.2)
Alkaline Phosphatase: 80 U/L (ref 39–117)
BILIRUBIN DIRECT: 0.1 mg/dL (ref 0.0–0.3)
Total Bilirubin: 0.4 mg/dL (ref 0.2–1.2)
Total Protein: 7.1 g/dL (ref 6.0–8.3)

## 2013-12-09 LAB — MICROALBUMIN / CREATININE URINE RATIO
CREATININE, U: 212.9 mg/dL
MICROALB UR: 1.7 mg/dL (ref 0.0–1.9)
MICROALB/CREAT RATIO: 0.8 mg/g (ref 0.0–30.0)

## 2013-12-09 LAB — LIPID PANEL
Cholesterol: 188 mg/dL (ref 0–200)
HDL: 37.9 mg/dL — ABNORMAL LOW (ref >39.00)
LDL Cholesterol: 128 mg/dL — ABNORMAL HIGH (ref 0–99)
NonHDL: 150.1
TRIGLYCERIDES: 111 mg/dL (ref 0.0–149.0)
Total CHOL/HDL Ratio: 5
VLDL: 22.2 mg/dL (ref 0.0–40.0)

## 2013-12-09 LAB — HEMOGLOBIN A1C: Hgb A1c MFr Bld: 7.3 % — ABNORMAL HIGH (ref 4.6–6.5)

## 2013-12-12 ENCOUNTER — Other Ambulatory Visit: Payer: Self-pay

## 2013-12-12 MED ORDER — ATORVASTATIN CALCIUM 10 MG PO TABS: 10.0000 mg | ORAL_TABLET | Freq: Every day | ORAL | Status: DC

## 2013-12-23 ENCOUNTER — Other Ambulatory Visit: Payer: Self-pay | Admitting: Family Medicine

## 2013-12-30 ENCOUNTER — Other Ambulatory Visit: Payer: Self-pay | Admitting: *Deleted

## 2013-12-30 MED ORDER — BUPROPION HCL ER (XL) 150 MG PO TB24: ORAL_TABLET | ORAL | Status: DC

## 2014-02-23 ENCOUNTER — Other Ambulatory Visit: Payer: Self-pay | Admitting: Family Medicine

## 2014-03-19 ENCOUNTER — Ambulatory Visit: Payer: Managed Care, Other (non HMO) | Admitting: Family Medicine

## 2014-04-02 ENCOUNTER — Encounter: Payer: Self-pay | Admitting: Family Medicine

## 2014-04-02 ENCOUNTER — Ambulatory Visit (INDEPENDENT_AMBULATORY_CARE_PROVIDER_SITE_OTHER): Payer: Managed Care, Other (non HMO) | Admitting: Family Medicine

## 2014-04-02 VITALS — BP 124/80 | HR 81 | Temp 98.1°F | Wt 233.6 lb

## 2014-04-02 DIAGNOSIS — E785 Hyperlipidemia, unspecified: Secondary | ICD-10-CM | POA: Diagnosis not present

## 2014-04-02 DIAGNOSIS — F329 Major depressive disorder, single episode, unspecified: Secondary | ICD-10-CM | POA: Diagnosis not present

## 2014-04-02 DIAGNOSIS — Z1231 Encounter for screening mammogram for malignant neoplasm of breast: Secondary | ICD-10-CM | POA: Diagnosis not present

## 2014-04-02 DIAGNOSIS — F418 Other specified anxiety disorders: Secondary | ICD-10-CM

## 2014-04-02 DIAGNOSIS — E1165 Type 2 diabetes mellitus with hyperglycemia: Secondary | ICD-10-CM

## 2014-04-02 DIAGNOSIS — F32A Depression, unspecified: Secondary | ICD-10-CM

## 2014-04-02 DIAGNOSIS — IMO0002 Reserved for concepts with insufficient information to code with codable children: Secondary | ICD-10-CM

## 2014-04-02 LAB — HEMOGLOBIN A1C: Hgb A1c MFr Bld: 7.1 % — ABNORMAL HIGH (ref 4.6–6.5)

## 2014-04-02 LAB — LIPID PANEL
CHOL/HDL RATIO: 4
Cholesterol: 175 mg/dL (ref 0–200)
HDL: 41 mg/dL (ref >39.00)
LDL Cholesterol: 109 mg/dL — ABNORMAL HIGH (ref 0–99)
NONHDL: 134
Triglycerides: 123 mg/dL (ref 0.0–149.0)
VLDL: 24.6 mg/dL (ref 0.0–40.0)

## 2014-04-02 LAB — HEPATIC FUNCTION PANEL
ALT: 48 U/L — AB (ref 0–35)
AST: 42 U/L — ABNORMAL HIGH (ref 0–37)
Albumin: 4.2 g/dL (ref 3.5–5.2)
Alkaline Phosphatase: 83 U/L (ref 39–117)
BILIRUBIN DIRECT: 0.1 mg/dL (ref 0.0–0.3)
TOTAL PROTEIN: 7.3 g/dL (ref 6.0–8.3)
Total Bilirubin: 0.4 mg/dL (ref 0.2–1.2)

## 2014-04-02 LAB — POCT URINALYSIS DIPSTICK
Bilirubin, UA: NEGATIVE
Glucose, UA: NEGATIVE
Ketones, UA: NEGATIVE
Leukocytes, UA: NEGATIVE
Nitrite, UA: NEGATIVE
PH UA: 5.5
PROTEIN UA: NEGATIVE
RBC UA: NEGATIVE
SPEC GRAV UA: 1.025
UROBILINOGEN UA: NEGATIVE

## 2014-04-02 LAB — BASIC METABOLIC PANEL
BUN: 9 mg/dL (ref 6–23)
CO2: 29 mEq/L (ref 19–32)
Calcium: 9.5 mg/dL (ref 8.4–10.5)
Chloride: 105 mEq/L (ref 96–112)
Creatinine, Ser: 0.85 mg/dL (ref 0.40–1.20)
GFR: 72.85 mL/min (ref >60.00)
Glucose, Bld: 134 mg/dL — ABNORMAL HIGH (ref 70–99)
Potassium: 4.2 mEq/L (ref 3.5–5.1)
Sodium: 140 mEq/L (ref 135–145)

## 2014-04-02 LAB — MICROALBUMIN / CREATININE URINE RATIO
CREATININE, U: 231.2 mg/dL
Microalb Creat Ratio: 0.6 mg/g (ref 0.0–30.0)
Microalb, Ur: 1.5 mg/dL (ref 0.0–1.9)

## 2014-04-02 MED ORDER — BUPROPION HCL ER (XL) 300 MG PO TB24: 300.0000 mg | ORAL_TABLET | Freq: Every day | ORAL | Status: DC

## 2014-04-02 NOTE — Assessment & Plan Note (Signed)
con't [place] Check labs today

## 2014-04-02 NOTE — Assessment & Plan Note (Signed)
Doing much better with wellbutrin--- and handling the situation in general

## 2014-04-02 NOTE — Assessment & Plan Note (Signed)
Check labs today.

## 2014-04-02 NOTE — Progress Notes (Signed)
Pre visit review using our clinic review tool, if applicable. No additional management support is needed unless otherwise documented below in the visit note. 

## 2014-04-02 NOTE — Patient Instructions (Signed)
Diabetes and Sick Day Management Blood sugar (glucose) can be more difficult to control when you are sick. Colds, fever, flu, nausea, vomiting, and diarrhea are all examples of common illnesses that can cause problems for people with diabetes. Loss of body fluids (dehydration) from fever, vomiting, diarrhea, infection, and the stress of a sickness can all cause blood glucose levels to increase. Because of this, it is very important to take your diabetes medicines and to eat some form of carbohydrate food when you are sick. Liquid or soft foods are often tolerated, and they help to replace fluids. HOME CARE INSTRUCTIONS These main guidelines are intended for managing a short-term (24 hours or less) sickness:  Take your usual dose of insulin or oral diabetes medicine. An exception would be if you take any form of metformin. If you cannot eat or drink, you can become dehydrated and should not take this medicine.  Continue to take your insulin even if you are unable to eat solid foods or are vomiting. Your insulin dose may stay the same, or it may need to be increased when you are sick.  You will need to test your blood glucose more often, generally every 2-4 hours. If you have type 1 diabetes, test your urine for ketones every 4 hours. If you have type 2 diabetes, test your urine for ketones as directed by your health care provider.  Eat some form of food that contains carbohydrates. The carbohydrates can be in solid or liquid form. You should eat 45-50 g of carbohydrates every 3-4 hours.  Replace fluids if you have a fever, vomit, or have diarrhea. Ask your health care provider for specific rehydration instructions.  Watch carefully for the signs of ketoacidosis if you have type 1 diabetes. Call your health care provider if any of the following symptoms are present, especially in children:  Moderate to large ketones in the urine along with a high blood glucose level.  Severe  nausea.  Vomiting.  Diarrhea.  Abdominal pain.  Rapid breathing.  Drink extra liquids that do not contain sugar such as water.  Be careful with over-the-counter medicines. Read the labels. They may contain sugar or types of sugars that can increase your blood glucose level. Food Choices for Illness All of the food choices below contain about 15 g of carbohydrates. Plan ahead and keep some of these foods around.    to  cup carbonated beverage containing sugar. Carbonated beverages will usually be better tolerated if they are opened and left at room temperature for a few minutes.   of a twin frozen ice pop.   cup regular gelatin.   cup juice.   cup ice cream or frozen yogurt.   cup cooked cereal.   cup sherbet.  1 cup clear broth or soup.  1 cup cream soup.   cup regular custard.   cup regular pudding.  1 cup sports drink.  1 cup plain yogurt.  1 slice toast.  6 squares saltine crackers.  5 vanilla wafers. SEEK MEDICAL CARE IF:   You are unable to drink fluids, even small amounts.  You have nausea and vomiting for more than 6 hours.  You have diarrhea for more than 6 hours.  Your blood glucose level is more than 240 mg/dL, even with additional insulin.  There is a change in mental status.  You develop an additional serious sickness.  You have been sick for 2 days and are not getting better.  You have a fever. SEEK IMMEDIATE  MEDICAL CARE IF:  You have difficulty breathing.  You have moderate to large ketone levels. MAKE SURE YOU:  Understand these instructions.  Will watch your condition.  Will get help right away if you are not doing well or get worse. Document Released: 12/22/2002 Document Revised: 05/05/2013 Document Reviewed: 05/28/2012 [facility] Patient Information 2015 [place], [place]. This information is not intended to replace advice given to you by your health care provider. Make sure you discuss any questions you have with  your health care provider.
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
## 2014-04-08 ENCOUNTER — Telehealth: Payer: Self-pay | Admitting: *Deleted

## 2014-04-08 ENCOUNTER — Other Ambulatory Visit: Payer: Self-pay

## 2014-04-08 MED ORDER — CANAGLIFLOZIN 100 MG PO TABS: 100.0000 mg | ORAL_TABLET | Freq: Every day | ORAL | Status: DC

## 2014-04-08 NOTE — Telephone Encounter (Signed)
Prior authorization for Invokana initiated. Awaiting determination. JG//CMA

## 2014-04-13 MED ORDER — DAPAGLIFLOZIN PROPANEDIOL 5 MG PO TABS: 5.0000 mg | ORAL_TABLET | Freq: Every day | ORAL | Status: DC

## 2014-04-13 NOTE — Telephone Encounter (Signed)
PA denied. Alternatives include [place] and Jardiance. Please advise. JG//CMA

## 2014-04-13 NOTE — Telephone Encounter (Signed)
farxiga 5 mg #30  2 refills 1 po qam

## 2014-04-13 NOTE — Telephone Encounter (Signed)
Med e-scribed to pharmacy. JG//CMA 

## 2014-04-13 NOTE — Addendum Note (Signed)
Addended by: Murtis Sink A on: 04/13/2014 10:51 AM   Modules accepted: Orders, Medications

## 2014-07-10 ENCOUNTER — Other Ambulatory Visit: Payer: Self-pay | Admitting: Family Medicine

## 2014-11-17 ENCOUNTER — Telehealth: Payer: Self-pay | Admitting: Family Medicine

## 2014-11-17 DIAGNOSIS — E785 Hyperlipidemia, unspecified: Secondary | ICD-10-CM

## 2014-11-17 DIAGNOSIS — E119 Type 2 diabetes mellitus without complications: Secondary | ICD-10-CM

## 2014-11-17 NOTE — Telephone Encounter (Signed)
Orders in..     KP 

## 2014-11-17 NOTE — Telephone Encounter (Signed)
Pt coming in tomorrow for possible pink eye at 10:00am with [facility]. She is wanting to get her repeat labs that were due in June while she is here. Per notes needs lipid, hep bmp hgba1c.

## 2014-11-18 ENCOUNTER — Encounter: Payer: Self-pay | Admitting: Physician Assistant

## 2014-11-18 ENCOUNTER — Other Ambulatory Visit (INDEPENDENT_AMBULATORY_CARE_PROVIDER_SITE_OTHER): Payer: Managed Care, Other (non HMO)

## 2014-11-18 ENCOUNTER — Ambulatory Visit (INDEPENDENT_AMBULATORY_CARE_PROVIDER_SITE_OTHER): Payer: Managed Care, Other (non HMO) | Admitting: Physician Assistant

## 2014-11-18 VITALS — BP 140/61 | HR 69 | Temp 98.1°F | Resp 16 | Ht 69.0 in | Wt 234.0 lb

## 2014-11-18 DIAGNOSIS — H1089 Other conjunctivitis: Secondary | ICD-10-CM | POA: Diagnosis not present

## 2014-11-18 DIAGNOSIS — H109 Unspecified conjunctivitis: Secondary | ICD-10-CM | POA: Insufficient documentation

## 2014-11-18 DIAGNOSIS — A499 Bacterial infection, unspecified: Secondary | ICD-10-CM

## 2014-11-18 DIAGNOSIS — E785 Hyperlipidemia, unspecified: Secondary | ICD-10-CM | POA: Diagnosis not present

## 2014-11-18 DIAGNOSIS — E119 Type 2 diabetes mellitus without complications: Secondary | ICD-10-CM | POA: Diagnosis not present

## 2014-11-18 LAB — LIPID PANEL
CHOL/HDL RATIO: 4
Cholesterol: 177 mg/dL (ref 0–200)
HDL: 42.2 mg/dL (ref >39.00)
LDL Cholesterol: 118 mg/dL — ABNORMAL HIGH (ref 0–99)
NONHDL: 134.34
TRIGLYCERIDES: 84 mg/dL (ref 0.0–149.0)
VLDL: 16.8 mg/dL (ref 0.0–40.0)

## 2014-11-18 LAB — COMPREHENSIVE METABOLIC PANEL
ALT: 73 U/L — AB (ref 0–35)
AST: 65 U/L — AB (ref 0–37)
Albumin: 4.2 g/dL (ref 3.5–5.2)
Alkaline Phosphatase: 75 U/L (ref 39–117)
BILIRUBIN TOTAL: 0.4 mg/dL (ref 0.2–1.2)
BUN: 9 mg/dL (ref 6–23)
CO2: 30 meq/L (ref 19–32)
CREATININE: 0.89 mg/dL (ref 0.40–1.20)
Calcium: 9.4 mg/dL (ref 8.4–10.5)
Chloride: 105 mEq/L (ref 96–112)
GFR: 68.94 mL/min (ref >60.00)
GLUCOSE: 126 mg/dL — AB (ref 70–99)
Potassium: 4.3 mEq/L (ref 3.5–5.1)
SODIUM: 142 meq/L (ref 135–145)
Total Protein: 7.1 g/dL (ref 6.0–8.3)

## 2014-11-18 LAB — HEMOGLOBIN A1C: HEMOGLOBIN A1C: 7.2 % — AB (ref 4.6–6.5)

## 2014-11-18 MED ORDER — ERYTHROMYCIN 5 MG/GM OP OINT
1.0000 | TOPICAL_OINTMENT | Freq: Three times a day (TID) | OPHTHALMIC | Status: DC
Start: 2014-11-18 — End: 2014-12-11

## 2014-11-18 NOTE — Telephone Encounter (Signed)
FYI--Patient came into the office upset because she was never told about this script, She saw Einar Pheasant and discussed with him and Ivin Booty.,

## 2014-11-18 NOTE — Progress Notes (Signed)
Patient presents to clinic today c/o one week of bilateral eye redness with discharge and matting upon waking. Patient denies fever, chills, vision changes or eye pain. Denies sick contact or occupational exposure. Has not taken anything for symptoms.  Past Medical History  Diagnosis Date  . Postmenopausal   . Anxiety   . Depression     Current Outpatient Prescriptions on File Prior to Visit  Medication Sig Dispense Refill  . buPROPion (WELLBUTRIN XL) 300 MG 24 hr tablet Take 1 tablet (300 mg total) by mouth daily. 90 tablet 3  . glucose blood (ONE TOUCH ULTRA TEST) test strip Check Blood sugar twice daily 100 each 12  . Omega-3 Fatty Acids (FISH OIL) 1000 MG CAPS Take 1 capsule by mouth daily.    Glory Rosebush DELICA LANCETS 99991111 MISC Check blood sugar twice daily. 100 each 12  . sitaGLIPtin (JANUVIA) 100 MG tablet Take 1 tablet (100 mg total) by mouth daily. Repeat labs are due now 90 tablet 0  . dapagliflozin propanediol (FARXIGA) 5 MG TABS tablet Take 5 mg by mouth daily. (Patient not taking: Reported on 11/18/2014) 30 tablet 2   No current facility-administered medications on file prior to visit.    No Known Allergies  Family History  Problem Relation Age of Onset  . Coronary artery disease  42    female  . Lung disease Father     fungus-Aspergillosis  . Hypertension Paternal Grandfather   . Diabetes Paternal Grandfather   . Stroke Paternal Grandfather   . Cancer Maternal Grandfather   . Cancer Maternal Grandmother   . Obesity Brother   . Cancer Maternal Aunt     colon  . Colon cancer Maternal Aunt   . Esophageal cancer Neg Hx   . Rectal cancer Neg Hx   . Stomach cancer Neg Hx     Social History   Social History  . Marital Status: Widowed    Spouse Name: N/A  . Number of Children: N/A  . Years of Education: N/A   Social History Main Topics  . Smoking status: Former Smoker    Types: Cigarettes    Quit date: 02/03/2012  . Smokeless tobacco: Never Used   Comment: uses e cigarettes  . Alcohol Use: Yes     Comment: rare  . Drug Use: No  . Sexual Activity: Not Currently   Other Topics Concern  . None   Social History Narrative    Review of Systems - See HPI.  All other ROS are negative.  BP 140/61 mmHg  Pulse 69  Temp(Src) 98.1 F (36.7 C) (Oral)  Resp 16  Ht 5\' 9"  (1.753 m)  Wt 234 lb (106.142 kg)  BMI 34.54 kg/m2  SpO2 97%  Physical Exam  Constitutional: She is oriented to person, place, and time and well-developed, well-nourished, and in no distress.  HENT:  Head: Normocephalic and atraumatic.  Eyes: EOM are normal. Pupils are equal, round, and reactive to light. Lids are everted and swept, no foreign bodies found. Right eye exhibits discharge. Right eye exhibits no hordeolum. Left eye exhibits discharge. Left eye exhibits no hordeolum. Right conjunctiva is injected. Right conjunctiva has no hemorrhage. Left conjunctiva is injected. Left conjunctiva has no hemorrhage.  Cardiovascular: Normal rate, regular rhythm, normal heart sounds and intact distal pulses.   Pulmonary/Chest: Effort normal and breath sounds normal. No respiratory distress. She has no wheezes. She has no rales. She exhibits no tenderness.  Neurological: She is alert and oriented to  person, place, and time.  Skin: Skin is warm and dry. No rash noted.  Psychiatric: Affect normal.  Vitals reviewed.   No results found for this or any previous visit (from the past 2160 hour(s)).  Assessment/Plan: Bacterial conjunctivitis Rx Romycin ointment 3 times a day 7 days. Apply to the affected eyes. Supportive and hygiene measures discussed with patient. Follow-up if symptoms are not improving.

## 2014-11-18 NOTE — Patient Instructions (Signed)
Please use antibiotic ointment as directed. Avoid touching eyes. Use warm compresses as directed.  Call or return to clinic if symptoms are not resolving.  Bacterial Conjunctivitis Bacterial conjunctivitis, commonly called pink eye, is an inflammation of the clear membrane that covers the white part of the eye (conjunctiva). The inflammation can also happen on the underside of the eyelids. The blood vessels in the conjunctiva become inflamed, causing the eye to become red or pink. Bacterial conjunctivitis may spread easily from one eye to another and from person to person (contagious).  CAUSES  Bacterial conjunctivitis is caused by bacteria. The bacteria may come from your own skin, your upper respiratory tract, or from someone else with bacterial conjunctivitis. SYMPTOMS  The normally white color of the eye or the underside of the eyelid is usually pink or red. The pink eye is usually associated with irritation, tearing, and some sensitivity to light. Bacterial conjunctivitis is often associated with a thick, yellowish discharge from the eye. The discharge may turn into a crust on the eyelids overnight, which causes your eyelids to stick together. If a discharge is present, there may also be some blurred vision in the affected eye. DIAGNOSIS  Bacterial conjunctivitis is diagnosed by your caregiver through an eye exam and the symptoms that you report. Your caregiver looks for changes in the surface tissues of your eyes, which may point to the specific type of conjunctivitis. A sample of any discharge may be collected on a cotton-tip swab if you have a severe case of conjunctivitis, if your cornea is affected, or if you keep getting repeat infections that do not respond to treatment. The sample will be sent to a lab to see if the inflammation is caused by a bacterial infection and to see if the infection will respond to antibiotic medicines. TREATMENT   Bacterial conjunctivitis is treated with  antibiotics. Antibiotic eyedrops are most often used. However, antibiotic ointments are also available. Antibiotics pills are sometimes used. Artificial tears or eye washes may ease discomfort. HOME CARE INSTRUCTIONS   To ease discomfort, apply a cool, clean washcloth to your eye for 10-20 minutes, 3-4 times a day.  Gently wipe away any drainage from your eye with a warm, wet washcloth or a cotton ball.  Wash your hands often with soap and water. Use paper towels to dry your hands.  Do not share towels or washcloths. This may spread the infection.  Change or wash your pillowcase every day.  You should not use eye makeup until the infection is gone.  Do not operate machinery or drive if your vision is blurred.  Stop using contact lenses. Ask your caregiver how to sterilize or replace your contacts before using them again. This depends on the type of contact lenses that you use.  When applying medicine to the infected eye, do not touch the edge of your eyelid with the eyedrop bottle or ointment tube. SEEK IMMEDIATE MEDICAL CARE IF:   Your infection has not improved within 3 days after beginning treatment.  You had yellow discharge from your eye and it returns.  You have increased eye pain.  Your eye redness is spreading.  Your vision becomes blurred.  You have a fever or persistent symptoms for more than 2-3 days.  You have a fever and your symptoms suddenly get worse.  You have facial pain, redness, or swelling. MAKE SURE YOU:   Understand these instructions.  Will watch your condition.  Will get help right away if you are not  doing well or get worse.   This information is not intended to replace advice given to you by your health care provider. Make sure you discuss any questions you have with your health care provider.   Document Released: 12/19/2004 Document Revised: 01/09/2014 Document Reviewed: 05/22/2011 Elsevier Interactive Patient Education [employer].

## 2014-11-18 NOTE — Progress Notes (Signed)
Pre visit review using our clinic review tool, if applicable. No additional management support is needed unless otherwise documented below in the visit note/SLS  

## 2014-11-18 NOTE — Assessment & Plan Note (Signed)
Rx Romycin ointment 3 times a day 7 days. Apply to the affected eyes. Supportive and hygiene measures discussed with patient. Follow-up if symptoms are not improving.

## 2014-11-19 ENCOUNTER — Encounter: Payer: Self-pay | Admitting: Family Medicine

## 2014-11-19 NOTE — Telephone Encounter (Signed)
Someone should have informed her her ins requested the change

## 2014-11-20 ENCOUNTER — Other Ambulatory Visit: Payer: Self-pay | Admitting: Family Medicine

## 2014-11-24 ENCOUNTER — Telehealth: Payer: Self-pay | Admitting: Family Medicine

## 2014-11-24 ENCOUNTER — Other Ambulatory Visit: Payer: Self-pay | Admitting: Family Medicine

## 2014-11-24 MED ORDER — SITAGLIPTIN PHOSPHATE 100 MG PO TABS: 100.0000 mg | ORAL_TABLET | Freq: Every day | ORAL | Status: DC

## 2014-11-24 NOTE — Telephone Encounter (Signed)
Relation to [id number] Call back number:[phone] Pharmacy: [id number] - [place], [place] AT [address]  Reason for call:  Patient is requesting a refill sitaGLIPtin (JANUVIA) 100 MG tablet, patient states she is completley out.

## 2014-11-24 NOTE — Telephone Encounter (Signed)
Rx faxed.    KP 

## 2014-12-06 ENCOUNTER — Encounter: Payer: Self-pay | Admitting: Family Medicine

## 2014-12-10 ENCOUNTER — Telehealth: Payer: Self-pay

## 2014-12-10 NOTE — Telephone Encounter (Signed)
C/o:  Continues to experience bilateral eye redness and crusting.  Last saw Elyn Aquas, PA-C on 11/18/14 for similar symptoms.  She was given erythromycin ophthalmic ointment and took as directed with only minimal improvement.  Pt states that she needs more medication to clear symptoms.  She is asking for drops instead of an ointment.   Please advise.

## 2014-12-10 NOTE — Telephone Encounter (Signed)
Left a message for call back.  

## 2014-12-10 NOTE — Telephone Encounter (Signed)
If she has had symptoms for 3 weeks now without improvement she needs re-assessment as she should have responded to antibiotic. I am ok sending in a different Rx for her to start if she wishes but I really recommend she be seen again in office. Please let me know what patient decides.

## 2014-12-11 ENCOUNTER — Ambulatory Visit (INDEPENDENT_AMBULATORY_CARE_PROVIDER_SITE_OTHER): Payer: Managed Care, Other (non HMO) | Admitting: Physician Assistant

## 2014-12-11 ENCOUNTER — Encounter: Payer: Self-pay | Admitting: Physician Assistant

## 2014-12-11 VITALS — BP 130/70 | HR 80 | Temp 98.6°F | Wt 239.0 lb

## 2014-12-11 DIAGNOSIS — H1010 Acute atopic conjunctivitis, unspecified eye: Secondary | ICD-10-CM | POA: Insufficient documentation

## 2014-12-11 DIAGNOSIS — H1013 Acute atopic conjunctivitis, bilateral: Secondary | ICD-10-CM | POA: Diagnosis not present

## 2014-12-11 DIAGNOSIS — Z23 Encounter for immunization: Secondary | ICD-10-CM

## 2014-12-11 DIAGNOSIS — J309 Allergic rhinitis, unspecified: Principal | ICD-10-CM

## 2014-12-11 MED ORDER — AZELASTINE HCL 0.05 % OP SOLN: 1.0000 [drp] | Freq: Two times a day (BID) | OPHTHALMIC | Status: DC

## 2014-12-11 NOTE — Assessment & Plan Note (Addendum)
Giving newly obtained history of chemical exposure and non-response to ABX, highly suspect allergic conjunctivitis. Discussed use of protective eye wear. Rx Azelastine op drops to use as directed. Call if no improvement.

## 2014-12-11 NOTE — Telephone Encounter (Signed)
Pt came in to see Cody on 12/09/14.

## 2014-12-11 NOTE — Patient Instructions (Signed)
Please use the eye drops as directed twice daily. Wear goggles when in areas with fumes/paint chemicals.  Let me know if symptoms are not resolving as we will need to get you in with an eye specialist.  Allergic Conjunctivitis A thin, clear membrane (conjunctiva) covers the white part of your eye and the inner surface of your eyelid. Allergic conjunctivitis happens when this membrane gets irritated. This is caused by allergies. Common things (allergens) that can cause an allergic reaction include:  Dust.  Pollen.  Mold.  Chemicals  Animal:  Hair.  Fur.  Skin.  Saliva or other animal fluids. This condition can make your eye red or pink. It can also make your eye feel itchy. This condition cannot be spread by one person to another person (noncontagious).  HOME CARE  Take or apply medicines only as told by your doctor.  Avoid touching or rubbing your eyes.  Apply a cool, clean washcloth to your eye for 10-20 minutes. Do this 3-4 times a day.  If you wear contact lenses, do not wear them until the irritation is gone. Wear glasses in the meantime.  Avoid wearing eye makeup until the irritation is gone.  Try to avoid whatever allergen is causing the allergic reaction. GET HELP IF:  Your symptoms get worse.  You have pus draining from your eyes.  You have new symptoms.  You have a fever.   This information is not intended to replace advice given to you by your health care provider. Make sure you discuss any questions you have with your health care provider.   Document Released: 06/08/2009 Document Revised: 01/09/2014 Document Reviewed: 09/30/2013 Elsevier Interactive Patient Education [employer].

## 2014-12-11 NOTE — Progress Notes (Signed)
Pre visit review using our clinic review tool, if applicable. No additional management support is needed unless otherwise documented below in the visit note. 

## 2014-12-11 NOTE — Progress Notes (Signed)
Patient presents to clinic today c/o continued bilateral eye irritation with clear drainage and watery eyes. 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 Denies new or worsening symptoms. Did not respond antibiotic given last month.  Past Medical History  Diagnosis Date  . Postmenopausal   . Anxiety   . Depression     Current Outpatient Prescriptions on File Prior to Visit  Medication Sig Dispense Refill  . buPROPion (WELLBUTRIN XL) 300 MG 24 hr tablet Take 1 tablet (300 mg total) by mouth daily. 90 tablet 3  . glucose blood (ONE TOUCH ULTRA TEST) test strip Check Blood sugar twice daily 100 each 12  . Omega-3 Fatty Acids (FISH OIL) 1000 MG CAPS Take 1 capsule by mouth daily.    Glory Rosebush DELICA LANCETS 65L MISC Check blood sugar twice daily. 100 each 12  . sitaGLIPtin (JANUVIA) 100 MG tablet Take 1 tablet (100 mg total) by mouth daily. 90 tablet 1  . dapagliflozin propanediol (FARXIGA) 5 MG TABS tablet Take 5 mg by mouth daily. (Patient not taking: Reported on 12/11/2014) 30 tablet 2   No current facility-administered medications on file prior to visit.    No Known Allergies  Family History  Problem Relation Age of Onset  . Coronary artery disease  45    female  . Lung disease Father     fungus-Aspergillosis  . Hypertension Paternal Grandfather   . Diabetes Paternal Grandfather   . Stroke Paternal Grandfather   . Cancer Maternal Grandfather   . Cancer Maternal Grandmother   . Obesity Brother   . Cancer Maternal Aunt     colon  . Colon cancer Maternal Aunt   . Esophageal cancer Neg Hx   . Rectal cancer Neg Hx   . Stomach cancer Neg Hx     Social History   Social History  . Marital Status: Widowed    Spouse Name: N/A  . Number of Children: N/A  . Years of Education: N/A   Social History Main Topics  . Smoking status: Former Smoker    Types: Cigarettes    Quit date: 02/03/2012  . Smokeless tobacco: Never Used    Comment: uses e cigarettes  . Alcohol Use: Yes     Comment: rare  . Drug Use: No  . Sexual Activity: Not Currently   Other Topics Concern  . None   Social History Narrative    Review of Systems - See HPI.  All other ROS are negative.  BP 130/70 mmHg  Pulse 80  Temp(Src) 98.6 F (37 C)  Wt 239 lb (108.41 kg)  SpO2 96%  Physical Exam  Constitutional: She is oriented to person, place, and time and well-developed, well-nourished, and in no distress.  HENT:  Head: Normocephalic and atraumatic.  Eyes: Right eye exhibits no discharge. Left eye exhibits no discharge. Right conjunctiva is injected. Right conjunctiva has no hemorrhage. Left conjunctiva is injected. Left conjunctiva has no hemorrhage.  Neck: Neck supple.  Cardiovascular: Normal rate, regular rhythm, normal heart sounds and intact distal pulses.   Pulmonary/Chest: Effort normal and breath sounds normal.  Neurological: She is alert and oriented to person, place, and time.  Skin: Skin is warm and dry. No rash noted.  Psychiatric: Affect normal.  Vitals reviewed.   Recent Results (from the past 2160 hour(s))  Lipid panel     Status: Abnormal   Collection Time: 11/18/14  9:41 AM  Result Value Ref Range  Cholesterol 177 0 - 200 mg/dL    Comment: ATP III Classification       Desirable:  < 200 mg/dL               Borderline High:  200 - 239 mg/dL          High:  > = 240 mg/dL   Triglycerides 84.0 0.0 - 149.0 mg/dL    Comment: Normal:  <150 mg/dLBorderline High:  150 - 199 mg/dL   HDL 42.20 >39.00 mg/dL   VLDL 16.8 0.0 - 40.0 mg/dL   LDL Cholesterol 118 (H) 0 - 99 mg/dL   Total CHOL/HDL Ratio 4     Comment:                Men          Women1/2 Average Risk     3.4          3.3Average Risk          5.0          4.42X Average Risk          9.6          7.13X Average Risk          15.0          11.0                       NonHDL 134.34     Comment: NOTE:  Non-HDL goal should be 30 mg/dL higher than patient's LDL goal (i.e.  LDL goal of < 70 mg/dL, would have non-HDL goal of < 100 mg/dL)  Comp Met (CMET)     Status: Abnormal   Collection Time: 11/18/14  9:41 AM  Result Value Ref Range   Sodium 142 135 - 145 mEq/L   Potassium 4.3 3.5 - 5.1 mEq/L   Chloride 105 96 - 112 mEq/L   CO2 30 19 - 32 mEq/L   Glucose, Bld 126 (H) 70 - 99 mg/dL   BUN 9 6 - 23 mg/dL   Creatinine, Ser 0.89 0.40 - 1.20 mg/dL   Total Bilirubin 0.4 0.2 - 1.2 mg/dL   Alkaline Phosphatase 75 39 - 117 U/L   AST 65 (H) 0 - 37 U/L   ALT 73 (H) 0 - 35 U/L   Total Protein 7.1 6.0 - 8.3 g/dL   Albumin 4.2 3.5 - 5.2 g/dL   Calcium 9.4 8.4 - 10.5 mg/dL   GFR 68.94 >60.00 mL/min  Hemoglobin A1c     Status: Abnormal   Collection Time: 11/18/14  9:41 AM  Result Value Ref Range   Hgb A1c MFr Bld 7.2 (H) 4.6 - 6.5 %    Comment: Glycemic Control Guidelines for People with Diabetes:Non Diabetic:  <6%Goal of Therapy: <7%Additional Action Suggested:  >8%     Assessment/Plan: Allergic conjunctivitis and rhinitis Giving newly obtained history of chemical exposure and non-response to ABX, highly suspect allergic conjunctivitis. Discussed use of protective eye wear. Rx Azelastine op drops to use as directed. Call if no improvement.

## 2014-12-14 ENCOUNTER — Ambulatory Visit: Payer: Managed Care, Other (non HMO) | Admitting: Physician Assistant

## 2015-04-07 ENCOUNTER — Other Ambulatory Visit: Payer: Self-pay | Admitting: Family Medicine

## 2015-05-20 ENCOUNTER — Other Ambulatory Visit: Payer: Self-pay | Admitting: Family Medicine

## 2015-05-24 ENCOUNTER — Encounter: Payer: Self-pay | Admitting: Family Medicine

## 2015-05-24 ENCOUNTER — Other Ambulatory Visit: Payer: Self-pay | Admitting: Family Medicine

## 2015-07-04 ENCOUNTER — Other Ambulatory Visit: Payer: Self-pay | Admitting: Family Medicine

## 2015-08-07 ENCOUNTER — Other Ambulatory Visit: Payer: Self-pay | Admitting: Family Medicine

## 2015-08-21 ENCOUNTER — Encounter: Payer: Self-pay | Admitting: Family Medicine

## 2015-08-22 ENCOUNTER — Other Ambulatory Visit: Payer: Self-pay | Admitting: Family Medicine

## 2015-08-23 ENCOUNTER — Other Ambulatory Visit: Payer: Self-pay | Admitting: Family Medicine

## 2015-08-23 DIAGNOSIS — E785 Hyperlipidemia, unspecified: Secondary | ICD-10-CM

## 2015-08-23 DIAGNOSIS — I10 Essential (primary) hypertension: Secondary | ICD-10-CM

## 2015-08-23 DIAGNOSIS — E1100 Type 2 diabetes mellitus with hyperosmolarity without nonketotic hyperglycemic-hyperosmolar coma (NKHHC): Secondary | ICD-10-CM

## 2015-08-23 NOTE — Telephone Encounter (Signed)
Orders are in

## 2015-09-28 ENCOUNTER — Other Ambulatory Visit: Payer: Self-pay | Admitting: Family Medicine

## 2015-09-28 DIAGNOSIS — Z1231 Encounter for screening mammogram for malignant neoplasm of breast: Secondary | ICD-10-CM

## 2015-10-15 ENCOUNTER — Encounter: Payer: Self-pay | Admitting: Family Medicine

## 2015-10-18 ENCOUNTER — Ambulatory Visit
Admission: RE | Admit: 2015-10-18 | Discharge: 2015-10-18 | Disposition: A | Discharge status: Home / Self Care | Payer: Managed Care, Other (non HMO) | Source: Ambulatory Visit | Attending: Family Medicine | Admitting: Family Medicine

## 2015-10-18 ENCOUNTER — Telehealth: Payer: Self-pay | Admitting: Family Medicine

## 2015-10-18 ENCOUNTER — Encounter: Payer: Self-pay | Admitting: Family Medicine

## 2015-10-18 DIAGNOSIS — Z1231 Encounter for screening mammogram for malignant neoplasm of breast: Secondary | ICD-10-CM

## 2015-10-18 NOTE — Telephone Encounter (Signed)
Pt called in to reschedule her CPE. Pt says that she had a hard time getting this appt due to PCP's schedule. Advised pt  of PCP's next available CPE and she says that appt is to far out. She would like to know if pcp could work her in sooner if possible.    Please advise for scheduling.

## 2015-10-18 NOTE — Telephone Encounter (Signed)
Please advise      KP 

## 2015-10-19 ENCOUNTER — Ambulatory Visit: Payer: Managed Care, Other (non HMO)

## 2015-10-25 NOTE — Telephone Encounter (Signed)
Please schedule in an 11:30 spot per Dr.Lowne.   KP

## 2015-10-25 NOTE — Telephone Encounter (Signed)
She can take a hosp f/u appointment -- would that be sooner?

## 2015-10-27 NOTE — Telephone Encounter (Signed)
Called pt. lvm to call us back if she would like to change her appt.

## 2015-10-27 NOTE — Telephone Encounter (Signed)
Patient scheduled app for 11/04/15 at 11:30

## 2015-11-04 ENCOUNTER — Ambulatory Visit (INDEPENDENT_AMBULATORY_CARE_PROVIDER_SITE_OTHER): Payer: Managed Care, Other (non HMO) | Admitting: Family Medicine

## 2015-11-04 ENCOUNTER — Ambulatory Visit (HOSPITAL_BASED_OUTPATIENT_CLINIC_OR_DEPARTMENT_OTHER)
Admission: RE | Admit: 2015-11-04 | Discharge: 2015-11-04 | Disposition: A | Discharge status: Home / Self Care | Payer: Managed Care, Other (non HMO) | Source: Ambulatory Visit | Attending: Family Medicine | Admitting: Family Medicine

## 2015-11-04 ENCOUNTER — Encounter: Payer: Self-pay | Admitting: Family Medicine

## 2015-11-04 VITALS — BP 138/84 | HR 82 | Temp 98.2°F | Resp 16 | Ht 69.0 in | Wt 230.0 lb

## 2015-11-04 DIAGNOSIS — M5441 Lumbago with sciatica, right side: Secondary | ICD-10-CM

## 2015-11-04 DIAGNOSIS — Z23 Encounter for immunization: Secondary | ICD-10-CM

## 2015-11-04 DIAGNOSIS — M5442 Lumbago with sciatica, left side: Secondary | ICD-10-CM | POA: Diagnosis not present

## 2015-11-04 DIAGNOSIS — E785 Hyperlipidemia, unspecified: Secondary | ICD-10-CM | POA: Diagnosis not present

## 2015-11-04 DIAGNOSIS — F411 Generalized anxiety disorder: Secondary | ICD-10-CM

## 2015-11-04 DIAGNOSIS — E1165 Type 2 diabetes mellitus with hyperglycemia: Secondary | ICD-10-CM

## 2015-11-04 DIAGNOSIS — E1151 Type 2 diabetes mellitus with diabetic peripheral angiopathy without gangrene: Secondary | ICD-10-CM | POA: Diagnosis not present

## 2015-11-04 DIAGNOSIS — IMO0002 Reserved for concepts with insufficient information to code with codable children: Secondary | ICD-10-CM

## 2015-11-04 MED ORDER — ALPRAZOLAM 0.25 MG PO TABS: 0.2500 mg | ORAL_TABLET | Freq: Three times a day (TID) | ORAL | 0 refills | Status: DC | PRN

## 2015-11-04 MED ORDER — SITAGLIPTIN PHOSPHATE 100 MG PO TABS: ORAL_TABLET | ORAL | 1 refills | Status: DC

## 2015-11-04 NOTE — Progress Notes (Signed)
Pre visit review using our clinic review tool, if applicable. No additional management support is needed unless otherwise documented below in the visit note. 

## 2015-11-04 NOTE — Patient Instructions (Signed)

## 2015-11-04 NOTE — Progress Notes (Signed)
Patient ID: Vickie Nichols, female    DOB: [birth year]  Age: 60 y.o. MRN: [id number]    Subjective:  Subjective  HPI Vickie Nichols presents for buttock pain x 8-9  Months-- it has not gotten worse but it is not improving.  Standing, walking really aggravate it.  Pain radiates down back of both thighs.  She does not feel week. Her anxiety is also high-- work brings it on.  It bothers her a few x a month.  Not daily.  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    Review of Systems  Constitutional: Negative for activity change, appetite change, chills, diaphoresis, fatigue, fever and unexpected weight change.  Eyes: Negative for pain, redness and visual disturbance.  Respiratory: Negative for cough, chest tightness, shortness of breath and wheezing.   Cardiovascular: Negative for chest pain, palpitations and leg swelling.  Gastrointestinal: Negative for abdominal distention and abdominal pain.  Endocrine: Negative for cold intolerance, heat intolerance, polydipsia, polyphagia and polyuria.  Genitourinary: Negative for difficulty urinating, dysuria, flank pain, frequency, genital sores, hematuria, menstrual problem, pelvic pain, urgency, vaginal discharge and vaginal pain.  Musculoskeletal: Positive for back pain.  Neurological: Negative for dizziness, light-headedness, numbness and headaches.  Psychiatric/Behavioral: Positive for sleep disturbance. Negative for agitation and dysphoric mood. The patient is nervous/anxious.     History Past Medical History:  Diagnosis Date  . Anxiety   . Depression   . Postmenopausal     She has a past surgical history that includes Cesarean section; Tubal ligation; and Tonsillectomy.   Her family history includes Cancer in her maternal aunt, maternal grandfather, and maternal grandmother; Colon cancer in her maternal aunt; Diabetes in her paternal grandfather; Hypertension in her paternal grandfather; Lung disease in her father; Obesity in her brother;  Stroke in her paternal grandfather.She reports that she quit smoking about 3 years ago. Her smoking use included Cigarettes. She has never used smokeless tobacco. She reports that she drinks alcohol. She reports that she does not use drugs.  Current Outpatient Prescriptions on File Prior to Visit  Medication Sig Dispense Refill  . buPROPion (WELLBUTRIN XL) 300 MG 24 hr tablet TAKE 1 TABLET BY MOUTH ONCE DAILY *OFFICE VISIT DUE NOW** 90 tablet 0  . glucose blood (ONE TOUCH ULTRA TEST) test strip Check Blood sugar twice daily 100 each 12  . ONETOUCH DELICA LANCETS 99991111 MISC Check blood sugar twice daily. 100 each 12  . azelastine (OPTIVAR) 0.05 % ophthalmic solution Place 1 drop into both eyes 2 (two) times daily. (Patient not taking: Reported on 11/04/2015) 6 mL 12  . dapagliflozin propanediol (FARXIGA) 5 MG TABS tablet Take 5 mg by mouth daily. (Patient not taking: Reported on 11/04/2015) 30 tablet 2  . Omega-3 Fatty Acids (FISH OIL) 1000 MG CAPS Take 1 capsule by mouth daily.     No current facility-administered medications on file prior to visit.      Objective:  Objective  Physical Exam  Constitutional: She is oriented to person, place, and time. She appears well-developed and well-nourished.  HENT:  Head: Normocephalic and atraumatic.  Eyes: Conjunctivae and EOM are normal.  Neck: Normal range of motion. Neck supple. No JVD present. Carotid bruit is not present. No thyromegaly present.  Cardiovascular: Normal rate, regular rhythm and normal heart sounds.   No murmur heard. Pulmonary/Chest: Effort normal and breath sounds normal. No respiratory distress. She has no wheezes. She has no rales. She exhibits no tenderness.  Musculoskeletal: Normal range of motion.  She exhibits no edema or tenderness.  Neurological: She is alert and oriented to person, place, and time. She displays normal reflexes. No cranial nerve deficit. She exhibits normal muscle tone. Coordination normal.  Psychiatric: Her  speech is normal and behavior is normal. Judgment and thought content normal. Her mood appears anxious. Cognition and memory are normal.  Nursing note and vitals reviewed.  BP 138/84 (BP Location: Right Arm, Patient Position: Sitting, Cuff Size: Large)   Pulse 82   Temp 98.2 F (36.8 C) (Oral)   Resp 16   Ht 5\' 9"  (1.753 m)   Wt 230 lb (104.3 kg)   SpO2 97%   BMI 33.97 kg/m  Wt Readings from Last 3 Encounters:  11/04/15 230 lb (104.3 kg)  12/11/14 239 lb (108.4 kg)  11/18/14 234 lb (106.1 kg)     Lab Results  Component Value Date   WBC 10.5 10/11/2012   HGB 14.3 10/11/2012   HCT 42.8 10/11/2012   PLT 232.0 10/11/2012   GLUCOSE 126 (H) 11/18/2014   CHOL 177 11/18/2014   TRIG 84.0 11/18/2014   HDL 42.20 11/18/2014   LDLDIRECT 169.3 06/28/2012   LDLCALC 118 (H) 11/18/2014   ALT 73 (H) 11/18/2014   AST 65 (H) 11/18/2014   NA 142 11/18/2014   K 4.3 11/18/2014   CL 105 11/18/2014   CREATININE 0.89 11/18/2014   BUN 9 11/18/2014   CO2 30 11/18/2014   TSH 0.72 06/28/2012   HGBA1C 7.2 (H) 11/18/2014   MICROALBUR 1.5 04/02/2014    Mm Screening Breast Tomo Bilateral  Result Date: 10/19/2015 CLINICAL DATA:  Screening. EXAM: 2D DIGITAL SCREENING BILATERAL MAMMOGRAM WITH CAD AND ADJUNCT TOMO COMPARISON:  Previous exam(s). ACR Breast Density Category b: There are scattered areas of fibroglandular density. FINDINGS: There are no findings suspicious for malignancy. Images were processed with CAD. IMPRESSION: No mammographic evidence of malignancy. A result letter of this screening mammogram will be mailed directly to the patient. RECOMMENDATION: Screening mammogram in one year. (Code:SM-B-01Y) BI-RADS CATEGORY  1: Negative. Electronically Signed   By: Lajean Manes M.D.   On: 10/19/2015 13:39     Assessment & Plan:  Plan  I have changed Ms. Jedlicka's JANUVIA to sitaGLIPtin. I am also having her start on ALPRAZolam. Additionally, I am having her maintain her glucose blood, ONETOUCH  DELICA LANCETS 99991111, Fish Oil, dapagliflozin propanediol, azelastine, and buPROPion.  Meds ordered this encounter  Medications  . ALPRAZolam (XANAX) 0.25 MG tablet    Sig: Take 1 tablet (0.25 mg total) by mouth 3 (three) times daily as needed for anxiety.    Dispense:  45 tablet    Refill:  0  . sitaGLIPtin (JANUVIA) 100 MG tablet    Sig: TAKE 1 TABLET (100 MG TOTAL) BY MOUTH DAILY.    Dispense:  90 tablet    Refill:  1    Problem List Items Addressed This Visit      Unprioritized   Hyperlipidemia LDL goal <70   Relevant Orders   Lipid panel   CBC with Differential/Platelet   POCT urinalysis dipstick   TSH    Other Visit Diagnoses    DM (diabetes mellitus) type II uncontrolled, periph vascular disorder ([place])    -  Primary   Relevant Medications   sitaGLIPtin (JANUVIA) 100 MG tablet   Other Relevant Orders   Comprehensive metabolic panel   Encounter for immunization       Relevant Medications   ALPRAZolam (XANAX) 0.25 MG tablet  Other Relevant Orders   Flu Vaccine QUAD 36+ mos IM (Completed)   Comprehensive metabolic panel   Lipid panel   CBC with Differential/Platelet   POCT urinalysis dipstick   TSH   DG Lumbar Spine Complete (Completed)   Generalized anxiety disorder       Relevant Medications   ALPRAZolam (XANAX) 0.25 MG tablet   Acute bilateral low back pain with bilateral sciatica       Relevant Orders   DG Lumbar Spine Complete (Completed)      Follow-up: Return in about 6 months (around 05/03/2016) for diabetes II, hyperlipidemia, hypertension.  Ann Held, DO

## 2015-11-05 ENCOUNTER — Other Ambulatory Visit (INDEPENDENT_AMBULATORY_CARE_PROVIDER_SITE_OTHER): Payer: Managed Care, Other (non HMO)

## 2015-11-05 DIAGNOSIS — E1165 Type 2 diabetes mellitus with hyperglycemia: Secondary | ICD-10-CM | POA: Diagnosis not present

## 2015-11-05 DIAGNOSIS — IMO0002 Reserved for concepts with insufficient information to code with codable children: Secondary | ICD-10-CM

## 2015-11-05 DIAGNOSIS — E785 Hyperlipidemia, unspecified: Secondary | ICD-10-CM | POA: Diagnosis not present

## 2015-11-05 DIAGNOSIS — Z23 Encounter for immunization: Secondary | ICD-10-CM | POA: Diagnosis not present

## 2015-11-05 DIAGNOSIS — E1151 Type 2 diabetes mellitus with diabetic peripheral angiopathy without gangrene: Secondary | ICD-10-CM | POA: Diagnosis not present

## 2015-11-05 LAB — COMPREHENSIVE METABOLIC PANEL
ALT: 48 U/L — ABNORMAL HIGH (ref 0–35)
AST: 37 U/L (ref 0–37)
Albumin: 4.5 g/dL (ref 3.5–5.2)
Alkaline Phosphatase: 72 U/L (ref 39–117)
BUN: 13 mg/dL (ref 6–23)
CALCIUM: 9.8 mg/dL (ref 8.4–10.5)
CHLORIDE: 104 meq/L (ref 96–112)
CO2: 32 meq/L (ref 19–32)
CREATININE: 0.91 mg/dL (ref 0.40–1.20)
GFR: 66.97 mL/min (ref >60.00)
Glucose, Bld: 136 mg/dL — ABNORMAL HIGH (ref 70–99)
Potassium: 4.4 mEq/L (ref 3.5–5.1)
SODIUM: 142 meq/L (ref 135–145)
Total Bilirubin: 0.5 mg/dL (ref 0.2–1.2)
Total Protein: 7.4 g/dL (ref 6.0–8.3)

## 2015-11-05 LAB — POCT URINALYSIS DIPSTICK
Bilirubin, UA: NEGATIVE
Glucose, UA: NEGATIVE
Ketones, UA: NEGATIVE
Leukocytes, UA: NEGATIVE
NITRITE UA: NEGATIVE
PH UA: 5.5
PROTEIN UA: NEGATIVE
RBC UA: NEGATIVE
UROBILINOGEN UA: 0.2

## 2015-11-05 LAB — LIPID PANEL
CHOL/HDL RATIO: 4
Cholesterol: 194 mg/dL (ref 0–200)
HDL: 45.5 mg/dL (ref >39.00)
LDL CALC: 124 mg/dL — AB (ref 0–99)
NonHDL: 148.01
TRIGLYCERIDES: 120 mg/dL (ref 0.0–149.0)
VLDL: 24 mg/dL (ref 0.0–40.0)

## 2015-11-05 LAB — CBC WITH DIFFERENTIAL/PLATELET
BASOS ABS: 0 10*3/uL (ref 0.0–0.1)
Basophils Relative: 0.3 % (ref 0.0–3.0)
EOS ABS: 0.1 10*3/uL (ref 0.0–0.7)
Eosinophils Relative: 0.9 % (ref 0.0–5.0)
HEMATOCRIT: 41.9 % (ref 36.0–46.0)
Hemoglobin: 14.1 g/dL (ref 12.0–15.0)
LYMPHS ABS: 4 10*3/uL (ref 0.7–4.0)
LYMPHS PCT: 39.2 % (ref 12.0–46.0)
MCHC: 33.6 g/dL (ref 30.0–36.0)
MCV: 92.3 fl (ref 78.0–100.0)
MONO ABS: 0.6 10*3/uL (ref 0.1–1.0)
Monocytes Relative: 5.5 % (ref 3.0–12.0)
NEUTROS ABS: 5.5 10*3/uL (ref 1.4–7.7)
NEUTROS PCT: 54.1 % (ref 43.0–77.0)
PLATELETS: 268 10*3/uL (ref 150.0–400.0)
RBC: 4.54 Mil/uL (ref 3.87–5.11)
RDW: 12.7 % (ref 11.5–15.5)
WBC: 10.1 10*3/uL (ref 4.0–10.5)

## 2015-11-05 LAB — TSH: TSH: 0.94 u[IU]/mL (ref 0.35–4.50)

## 2015-11-08 ENCOUNTER — Other Ambulatory Visit (INDEPENDENT_AMBULATORY_CARE_PROVIDER_SITE_OTHER): Payer: Managed Care, Other (non HMO)

## 2015-11-08 DIAGNOSIS — E1100 Type 2 diabetes mellitus with hyperosmolarity without nonketotic hyperglycemic-hyperosmolar coma (NKHHC): Secondary | ICD-10-CM

## 2015-11-08 LAB — HEMOGLOBIN A1C: HEMOGLOBIN A1C: 7 % — AB (ref 4.6–6.5)

## 2015-11-10 ENCOUNTER — Other Ambulatory Visit: Payer: Self-pay | Admitting: Family Medicine

## 2015-11-16 ENCOUNTER — Other Ambulatory Visit: Payer: Self-pay | Admitting: Family Medicine

## 2015-11-18 ENCOUNTER — Other Ambulatory Visit: Payer: Self-pay | Admitting: Family Medicine

## 2016-01-14 ENCOUNTER — Encounter: Payer: Self-pay | Admitting: Family Medicine

## 2016-05-15 ENCOUNTER — Other Ambulatory Visit: Payer: Self-pay | Admitting: Family Medicine

## 2016-08-21 ENCOUNTER — Encounter: Payer: Self-pay | Admitting: Family Medicine

## 2016-08-22 NOTE — Telephone Encounter (Signed)
lvm advising patient of message below, will try again this afternoon

## 2016-08-22 NOTE — Telephone Encounter (Signed)
Patient scheduled with PCP for 08/24/2016

## 2016-08-22 NOTE — Telephone Encounter (Signed)
Pt overdue for ov  Please make appointment

## 2016-08-22 NOTE — Telephone Encounter (Signed)
Need ov

## 2016-08-24 ENCOUNTER — Ambulatory Visit (INDEPENDENT_AMBULATORY_CARE_PROVIDER_SITE_OTHER): Payer: 59 | Admitting: Family Medicine

## 2016-08-24 ENCOUNTER — Encounter: Payer: Self-pay | Admitting: Family Medicine

## 2016-08-24 VITALS — BP 120/72 | HR 68 | Temp 98.1°F | Ht 69.0 in | Wt 205.6 lb

## 2016-08-24 DIAGNOSIS — E119 Type 2 diabetes mellitus without complications: Secondary | ICD-10-CM | POA: Diagnosis not present

## 2016-08-24 LAB — LIPID PANEL
Cholesterol: 157 mg/dL (ref 0–200)
HDL: 39.5 mg/dL (ref >39.00)
LDL Cholesterol: 96 mg/dL (ref 0–99)
NONHDL: 117.5
Total CHOL/HDL Ratio: 4
Triglycerides: 109 mg/dL (ref 0.0–149.0)
VLDL: 21.8 mg/dL (ref 0.0–40.0)

## 2016-08-24 LAB — COMPREHENSIVE METABOLIC PANEL
ALT: 12 U/L (ref 0–35)
AST: 15 U/L (ref 0–37)
Albumin: 4.2 g/dL (ref 3.5–5.2)
Alkaline Phosphatase: 61 U/L (ref 39–117)
BILIRUBIN TOTAL: 0.3 mg/dL (ref 0.2–1.2)
BUN: 14 mg/dL (ref 6–23)
CO2: 32 meq/L (ref 19–32)
CREATININE: 0.79 mg/dL (ref 0.40–1.20)
Calcium: 9.5 mg/dL (ref 8.4–10.5)
Chloride: 105 mEq/L (ref 96–112)
GFR: 78.63 mL/min (ref >60.00)
Glucose, Bld: 98 mg/dL (ref 70–99)
Potassium: 4.2 mEq/L (ref 3.5–5.1)
Sodium: 141 mEq/L (ref 135–145)
Total Protein: 7 g/dL (ref 6.0–8.3)

## 2016-08-24 LAB — HEMOGLOBIN A1C: HEMOGLOBIN A1C: 6 % (ref 4.6–6.5)

## 2016-08-24 NOTE — Progress Notes (Signed)
Patient ID: KAPRICE KAGE, female    DOB: [birth year]  Age: 61 y.o. MRN: [id number]    Subjective:  Subjective  HPI Vickie Nichols presents for f/u dm -- she is exercising daily and on weight watchers --- she has lost 25 lbs and feels better.  She is not checking her sugars.    Review of Systems  Constitutional: Negative for activity change, appetite change, fatigue and unexpected weight change.  Respiratory: Negative for cough and shortness of breath.   Cardiovascular: Negative for chest pain and palpitations.  Psychiatric/Behavioral: Negative for behavioral problems and dysphoric mood. The patient is not nervous/anxious.     History Past Medical History:  Diagnosis Date  . Anxiety   . Depression   . Postmenopausal     She has a past surgical history that includes Cesarean section; Tubal ligation; and Tonsillectomy.   Her family history includes Cancer in her maternal aunt, maternal grandfather, and maternal grandmother; Colon cancer in her maternal aunt; Coronary artery disease (age of onset: 62) in her unknown relative; Diabetes in her paternal grandfather; Hypertension in her paternal grandfather; Lung disease in her father; Obesity in her brother; Stroke in her paternal grandfather.She reports that she quit smoking about 4 years ago. Her smoking use included Cigarettes. She has never used smokeless tobacco. She reports that she drinks alcohol. She reports that she does not use drugs.  Current Outpatient Prescriptions on File Prior to Visit  Medication Sig Dispense Refill  . ALPRAZolam (XANAX) 0.25 MG tablet Take 1 tablet (0.25 mg total) by mouth 3 (three) times daily as needed for anxiety. 45 tablet 0  . buPROPion (WELLBUTRIN XL) 300 MG 24 hr tablet TAKE 1 TABLET BY MOUTH EVERY DAY 90 tablet 1  . glucose blood (ONE TOUCH ULTRA TEST) test strip Check Blood sugar twice daily 100 each 12  . Omega-3 Fatty Acids (FISH OIL) 1000 MG CAPS Take 1 capsule by mouth daily.    Vickie Nichols DELICA LANCETS 38H MISC Check blood sugar twice daily. 100 each 12  . sitaGLIPtin (JANUVIA) 100 MG tablet TAKE 1 TABLET (100 MG TOTAL) BY MOUTH DAILY. 90 tablet 1   No current facility-administered medications on file prior to visit.      Objective:  Objective  Physical Exam  Constitutional: She is oriented to person, place, and time. She appears well-developed and well-nourished.  HENT:  Head: Normocephalic and atraumatic.  Eyes: Conjunctivae and EOM are normal.  Neck: Normal range of motion. Neck supple. No JVD present. Carotid bruit is not present. No thyromegaly present.  Cardiovascular: Normal rate, regular rhythm and normal heart sounds.   No murmur heard. Pulmonary/Chest: Effort normal and breath sounds normal. No respiratory distress. She has no wheezes. She has no rales. She exhibits no tenderness.  Musculoskeletal: She exhibits no edema.  Neurological: She is alert and oriented to person, place, and time.  Psychiatric: She has a normal mood and affect.  Nursing note and vitals reviewed.  BP 120/72 (BP Location: Left Arm, Patient Position: Sitting, Cuff Size: Normal)   Pulse 68   Temp 98.1 F (36.7 C) (Oral)   Ht 5\' 9"  (1.753 m)   Wt 205 lb 9.6 oz (93.3 kg)   SpO2 98%   BMI 30.36 kg/m  Wt Readings from Last 3 Encounters:  08/24/16 205 lb 9.6 oz (93.3 kg)  11/04/15 230 lb (104.3 kg)  12/11/14 239 lb (108.4 kg)     Lab Results  Component Value Date   WBC  10.1 11/05/2015   HGB 14.1 11/05/2015   HCT 41.9 11/05/2015   PLT 268.0 11/05/2015   GLUCOSE 98 08/24/2016   CHOL 157 08/24/2016   TRIG 109.0 08/24/2016   HDL 39.50 08/24/2016   LDLDIRECT 169.3 06/28/2012   LDLCALC 96 08/24/2016   ALT 12 08/24/2016   AST 15 08/24/2016   NA 141 08/24/2016   K 4.2 08/24/2016   CL 105 08/24/2016   CREATININE 0.79 08/24/2016   BUN 14 08/24/2016   CO2 32 08/24/2016   TSH 0.94 11/05/2015   HGBA1C 6.0 08/24/2016   MICROALBUR 1.5 04/02/2014    Dg Lumbar Spine  Complete  Result Date: 11/04/2015 CLINICAL DATA:  Bilateral radiculopathy for several months, no known injury, initial encounter EXAM: LUMBAR SPINE - COMPLETE 4+ VIEW COMPARISON:  None. FINDINGS: Five lumbar type vertebral bodies are well visualized. Vertebral body height is well maintained. Mild facet hypertrophic changes are noted. No pars defects are seen. No anterolisthesis is seen. Mild osteophytic changes are noted predominately in the upper lumbar spine. Diffuse aortic calcifications are seen. A calcification is noted just to the left of the L5 vertebral body which could possibly represent a ureteral stone. Clinical correlation is recommended. IMPRESSION: Mild degenerative changes of the lumbar spine without acute abnormality. Question left ureteral stone. Electronically Signed   By: Inez Catalina M.D.   On: 11/04/2015 13:40     Assessment & Plan:  Plan  I have discontinued Ms. Heuberger's dapagliflozin propanediol, azelastine, and JANUVIA. I am also having her maintain her glucose blood, ONETOUCH DELICA LANCETS 83T, Fish Oil, ALPRAZolam, sitaGLIPtin, and buPROPion.  No orders of the defined types were placed in this encounter.   Problem List Items Addressed This Visit    None    Visit Diagnoses    Controlled type 2 diabetes mellitus without complication, without long-term current use of insulin ([place])    -  Primary   Relevant Orders   Lipid panel (Completed)   Hemoglobin A1c (Completed)   Comprehensive metabolic panel (Completed)    con't meds, check labs  con't diet and exercise  Follow-up: Return in about 3 months (around 11/24/2016), or if symptoms worsen or fail to improve.  Ann Held, DO

## 2016-08-24 NOTE — Patient Instructions (Signed)

## 2016-08-26 ENCOUNTER — Encounter: Payer: Self-pay | Admitting: Family Medicine

## 2016-08-28 ENCOUNTER — Other Ambulatory Visit: Payer: Self-pay | Admitting: Family Medicine

## 2016-08-28 ENCOUNTER — Encounter: Payer: Self-pay | Admitting: Family Medicine

## 2016-08-28 DIAGNOSIS — E785 Hyperlipidemia, unspecified: Secondary | ICD-10-CM

## 2016-08-28 NOTE — Telephone Encounter (Signed)
If sugars start dropping low frequently and hgba1c drops lower we can consider stopping it but right now it is perfect -- we don't want it going up

## 2016-11-26 ENCOUNTER — Other Ambulatory Visit: Payer: Self-pay | Admitting: Family Medicine

## 2017-05-31 ENCOUNTER — Other Ambulatory Visit: Payer: Self-pay | Admitting: Family Medicine

## 2017-06-04 ENCOUNTER — Other Ambulatory Visit: Payer: Self-pay | Admitting: Family Medicine

## 2017-12-07 ENCOUNTER — Other Ambulatory Visit: Payer: Self-pay | Admitting: Family Medicine

## 2017-12-17 ENCOUNTER — Emergency Department (HOSPITAL_COMMUNITY)
Admission: EM | Admit: 2017-12-17 | Discharge: 2017-12-18 | Disposition: A | Discharge status: Home / Self Care | Payer: 59 | Attending: Emergency Medicine | Admitting: Emergency Medicine

## 2017-12-17 ENCOUNTER — Encounter (HOSPITAL_COMMUNITY): Payer: Self-pay

## 2017-12-17 ENCOUNTER — Emergency Department (HOSPITAL_COMMUNITY): Payer: 59

## 2017-12-17 DIAGNOSIS — Z79899 Other long term (current) drug therapy: Secondary | ICD-10-CM | POA: Diagnosis not present

## 2017-12-17 DIAGNOSIS — R7989 Other specified abnormal findings of blood chemistry: Secondary | ICD-10-CM

## 2017-12-17 DIAGNOSIS — R002 Palpitations: Secondary | ICD-10-CM | POA: Diagnosis present

## 2017-12-17 DIAGNOSIS — Z87891 Personal history of nicotine dependence: Secondary | ICD-10-CM | POA: Diagnosis not present

## 2017-12-17 DIAGNOSIS — E119 Type 2 diabetes mellitus without complications: Secondary | ICD-10-CM | POA: Insufficient documentation

## 2017-12-17 LAB — CBC WITH DIFFERENTIAL/PLATELET
Abs Immature Granulocytes: 0.06 10*3/uL (ref 0.00–0.07)
Basophils Absolute: 0.1 10*3/uL (ref 0.0–0.1)
Basophils Relative: 0 %
Eosinophils Absolute: 0.1 10*3/uL (ref 0.0–0.5)
Eosinophils Relative: 1 %
HCT: 39.5 % (ref 36.0–46.0)
Hemoglobin: 12.7 g/dL (ref 12.0–15.0)
Immature Granulocytes: 0 %
Lymphocytes Relative: 38 %
Lymphs Abs: 5.2 10*3/uL — ABNORMAL HIGH (ref 0.7–4.0)
MCH: 31.1 pg (ref 26.0–34.0)
MCHC: 32.2 g/dL (ref 30.0–36.0)
MCV: 96.8 fL (ref 80.0–100.0)
Monocytes Absolute: 1.1 10*3/uL — ABNORMAL HIGH (ref 0.1–1.0)
Monocytes Relative: 8 %
Neutro Abs: 7.2 10*3/uL (ref 1.7–7.7)
Neutrophils Relative %: 53 %
Platelets: 305 10*3/uL (ref 150–400)
RBC: 4.08 MIL/uL (ref 3.87–5.11)
RDW: 12.3 % (ref 11.5–15.5)
WBC: 13.7 10*3/uL — ABNORMAL HIGH (ref 4.0–10.5)
nRBC: 0 % (ref 0.0–0.2)

## 2017-12-17 LAB — I-STAT TROPONIN, ED: Troponin i, poc: 0 ng/mL (ref 0.00–0.08)

## 2017-12-17 LAB — BASIC METABOLIC PANEL
Anion gap: 9 (ref 5–15)
BUN: 24 mg/dL — ABNORMAL HIGH (ref 8–23)
CO2: 24 mmol/L (ref 22–32)
Calcium: 8.9 mg/dL (ref 8.9–10.3)
Chloride: 109 mmol/L (ref 98–111)
Creatinine, Ser: 1.09 mg/dL — ABNORMAL HIGH (ref 0.44–1.00)
GFR calc Af Amer: >60 mL/min (ref >60)
GFR calc non Af Amer: 54 mL/min — ABNORMAL LOW (ref >60)
Glucose, Bld: 175 mg/dL — ABNORMAL HIGH (ref 70–99)
Potassium: 3.7 mmol/L (ref 3.5–5.1)
Sodium: 142 mmol/L (ref 135–145)

## 2017-12-17 MED ORDER — SODIUM CHLORIDE 0.9 % IV BOLUS
1000.0000 mL | Freq: Once | INTRAVENOUS | Status: AC
  Administered 2017-12-17: 1000 mL via INTRAVENOUS

## 2017-12-17 NOTE — Discharge Instructions (Addendum)
Your work-up today suggested dehydration.  Make sure you drink plenty of fluids for the next few days and you can recheck your kidney function with your primary care physician.  I would recommend cardiac work-up with either your primary care physician or cardiologist.  Return to the emergency department if any concerning signs or symptoms develop such as chest pains, shortness of breath, pain with deep breathing, leg swelling, lightheadedness, or passing out.

## 2017-12-17 NOTE — ED Triage Notes (Signed)
Pt is alert and oriented x 4 and I verbally responsive.  c/o rapid heart rate with associated tachypnea onset occurred post dinner tonight. Pt denies similar event in the past.  Pt denies chest pain at this time. Pt is able to speak and full sentence. Pt is escorted with DTR.  HR in triage 122

## 2017-12-17 NOTE — ED Provider Notes (Signed)
[facility] DEPT Provider Note   CSN: [id number] Arrival date & time: 12/17/17  1952     History   Chief Complaint Chief Complaint  Patient presents with  . Tachycardia    HPI Vickie Nichols is a 62 y.o. female with history of anxiety, depression, hyperlipidemia, diabetes presents for evaluation of acute onset, resolved episode of palpitations.  She reports that she spent all day outside gardening and did not have anything to eat and not much to drink.  She reports that around 7 PM she began to feel "weird "and noticed that her heart was beating fast.  Her daughter came to assess her and listen to her heart sounds and noted that she had an irregular rhythm.  She did note feeling as though she could not take a deep breath but denied any chest pain, lightheadedness, diaphoresis, or syncope.  No nausea, vomiting, or abdominal pain.  Symptoms lasted for approximately an hour before improving and eventually resolving and she notes she is currently back down to baseline.  She denies leg swelling, recent prolonged travel or surgeries, hemoptysis, prior history of DVT or PE, or hormone replacement therapy.  Has been drinking ginger ale with improvement. She currently uses smokeless tobacco through a vape.   The history is provided by the patient.    Past Medical History:  Diagnosis Date  . Anxiety   . Depression   . Postmenopausal     Patient Active Problem List   Diagnosis Date Noted  . Allergic conjunctivitis and rhinitis 12/11/2014  . Bacterial conjunctivitis 11/18/2014  . Frequency of urination 11/18/2012  . Hyperlipidemia LDL goal <70 06/28/2012  . Fatigue 04/24/2011  . Hot flashes, menopausal 07/20/2010  . Depression with anxiety 07/20/2010  . Diabetes mellitus type II, uncontrolled ([place]) 04/12/2010  . ASYMPTOMATIC POSTMENOPAUSAL STATUS 10/29/2007  . TOBACCO ABUSE, HX OF 08/12/2007    Past Surgical History:  Procedure Laterality Date  .  CESAREAN SECTION    . TONSILLECTOMY    . TUBAL LIGATION       OB History   No obstetric history on file.      Home Medications    Prior to Admission medications   Medication Sig Start Date End Date Taking? Authorizing Provider  buPROPion (WELLBUTRIN XL) 300 MG 24 hr tablet TAKE 1 TABLET BY MOUTH EVERY DAY 12/07/17  Yes Roma Schanz R, DO  ibuprofen (ADVIL,MOTRIN) 200 MG tablet Take 200 mg by mouth daily as needed for moderate pain.   Yes [provider]  JANUVIA 100 MG tablet TAKE 1 TABLET BY MOUTH EVERY DAY Patient taking differently: Take 100 mg by mouth daily.  12/07/17  Yes Ann Held, DO  ALPRAZolam (XANAX) 0.25 MG tablet Take 1 tablet (0.25 mg total) by mouth 3 (three) times daily as needed for anxiety. Patient not taking: Reported on 12/17/2017 11/04/15   Roma Schanz R, DO  glucose blood (ONE TOUCH ULTRA TEST) test strip Check Blood sugar twice daily 12/02/13   Carollee Herter, Kendrick Fries R, DO  [facility] DELICA LANCETS 17P MISC Check blood sugar twice daily. 12/02/13   Roma Schanz R, DO  sitaGLIPtin (JANUVIA) 100 MG tablet TAKE 1 TABLET (100 MG TOTAL) BY MOUTH DAILY. Patient not taking: Reported on 12/17/2017 11/04/15   Ann Held, DO    Family History Family History  Problem Relation Age of Onset  . Lung disease Father        fungus-Aspergillosis  . Cancer  Maternal Aunt        colon  . Colon cancer Maternal Aunt   . Coronary artery disease Other 44       female  . Hypertension Paternal Grandfather   . Diabetes Paternal Grandfather   . Stroke Paternal Grandfather   . Cancer Maternal Grandfather   . Cancer Maternal Grandmother   . Obesity Brother   . Esophageal cancer Neg Hx   . Rectal cancer Neg Hx   . Stomach cancer Neg Hx     Social History Social History   Tobacco Use  . Smoking status: Former Smoker    Types: Cigarettes    Last attempt to quit: 02/03/2012    Years since quitting: 5.8  . Smokeless tobacco: Never  Used  . Tobacco comment: uses e cigarettes  Substance Use Topics  . Alcohol use: Yes    Comment: rare  . Drug use: No     Allergies   Patient has no known allergies.   Review of Systems Review of Systems  Constitutional: Negative for chills, diaphoresis and fever.  Respiratory: Positive for shortness of breath. Negative for cough.   Cardiovascular: Positive for palpitations. Negative for chest pain.  Gastrointestinal: Negative for abdominal pain, nausea and vomiting.  All other systems reviewed and are negative.    Physical Exam Updated Vital Signs BP (!) 136/54   Pulse 83   Temp 98.4 F (36.9 C) (Oral)   Resp 16   Ht 5\' 9"  (1.753 m)   Wt 98.9 kg   SpO2 96%   BMI 32.21 kg/m   Physical Exam Vitals signs and nursing note reviewed.  Constitutional:      General: She is not in acute distress.    Appearance: She is well-developed.     Comments: Resting comfortably in bed  HENT:     Head: Normocephalic and atraumatic.  Eyes:     General:        Right eye: No discharge.        Left eye: No discharge.     Conjunctiva/sclera: Conjunctivae normal.  Neck:     Musculoskeletal: Neck supple.     Vascular: No JVD.     Trachea: No tracheal deviation.  Cardiovascular:     Rate and Rhythm: Normal rate.     Pulses: Normal pulses.     Comments: 2+ radial and DP/PT pulses bilaterally, Homans sign absent bilaterally, no lower extremity edema, no palpable cords, compartments are soft  Pulmonary:     Effort: Pulmonary effort is normal.     Breath sounds: Normal breath sounds.  Chest:     Chest wall: No tenderness.  Abdominal:     General: Bowel sounds are normal. There is no distension.     Tenderness: There is no abdominal tenderness.  Skin:    General: Skin is warm and dry.     Capillary Refill: Capillary refill takes less than 2 seconds.     Findings: No erythema.  Neurological:     Mental Status: She is alert.  Psychiatric:        Behavior: Behavior normal.       ED Treatments / Results  Labs (all labs ordered are listed, but only abnormal results are displayed) Labs Reviewed  CBC WITH DIFFERENTIAL/PLATELET - Abnormal; Notable for the following components:      Result Value   WBC 13.7 (*)    Lymphs Abs 5.2 (*)    Monocytes Absolute 1.1 (*)    All other components  within normal limits  BASIC METABOLIC PANEL - Abnormal; Notable for the following components:   Glucose, Bld 175 (*)    BUN 24 (*)    Creatinine, Ser 1.09 (*)    GFR calc non Af Amer 54 (*)    All other components within normal limits  I-STAT TROPONIN, ED    EKG EKG Interpretation  Date/Time:  Monday December 17 2017 23:59:08 EST Ventricular Rate:  83 PR Interval:    QRS Duration: 96 QT Interval:  410 QTC Calculation: 482 R Axis:   -9 Text Interpretation:  Sinus rhythm Normal ECG No old tracing to compare Confirmed by Rolland Porter [id number]) on 12/18/2017 5:11:12 PM   Radiology Dg Chest 2 View  Result Date: 12/17/2017 CLINICAL DATA:  Tachycardia EXAM: CHEST - 2 VIEW COMPARISON:  09/22/2010 FINDINGS: The heart size and mediastinal contours are within normal limits. Both lungs are clear. The visualized skeletal structures are unremarkable. IMPRESSION: No active cardiopulmonary disease. Electronically Signed   By: Ashley Royalty M.D.   On: 12/17/2017 23:32    Procedures Procedures (including critical care time)  Medications Ordered in ED Medications  sodium chloride 0.9 % bolus 1,000 mL (0 mLs Intravenous Stopped 12/18/17 0007)     Initial Impression / Assessment and Plan / ED Course  I have reviewed the triage vital signs and the nursing notes.  Pertinent labs & imaging results that were available during my care of the patient were reviewed by me and considered in my medical decision making (see chart for details).     Patient presents for evaluation of elevated heart rate after working outside in the garden all day without having much to eat or drink.  Some  associated lightheadedness with this transiently.  She is back to her baseline at this time.  She is afebrile, initially tachycardic at triage with complete resolution on my assessment.  EKG performed at that time showed sinus tachycardia with no evidence of other arrhythmia or ST segment elevations.  Lab work reviewed by me shows mild nonspecific leukocytosis, mildly elevated BUN/creatinine suggesting dehydration.  Not so significantly elevated to suggest AKI.  Troponin is negative and with reassuring EKG and no complaint of chest pain I doubt ACS/MI.  No evidence of A. fib or a flutter or other concerning arrhythmia.  Chest x-ray shows no acute cardiopulmonary abnormalities.  She was given a liter of IV fluids and on reassessment is resting comfortably remains in no apparent distress.  Doubt PE.  Suspect tachycardia secondary to dehydration.  Recommend p.o. fluid rehydration and follow-up with PCP or cardiologist for reevaluation and full cardiac work-up as she has not had a stress test or echo previously.  Discussed her to the return precautions.  Patient and daughter verbalized understanding of and agreement with plan and patient is stable for discharge home at this time.  Discussed with Dr. Alvino Chapel who agrees with assessment and plan at this time.  Final Clinical Impressions(s) / ED Diagnoses   Final diagnoses:  Palpitations  Elevated serum creatinine    ED Discharge Orders    None       Debroah Baller 12/18/17 2223    Davonna Belling, MD 12/18/17 2304

## 2018-01-01 ENCOUNTER — Other Ambulatory Visit: Payer: Self-pay | Admitting: Family Medicine

## 2018-01-31 ENCOUNTER — Other Ambulatory Visit: Payer: Self-pay | Admitting: Family Medicine

## 2018-04-04 ENCOUNTER — Telehealth: Payer: Self-pay | Admitting: Family Medicine

## 2018-04-04 ENCOUNTER — Encounter: Payer: Self-pay | Admitting: Family Medicine

## 2018-04-04 NOTE — Telephone Encounter (Signed)
rec'd voice message from pt. on COVID 19 Question line.   Ret'd call to pt.  Reported she is Diabetic, and is almost 63 yrs. old, and due to being high risk, if she would become ill with Coronavirus, is requesting letter from Dr. Carollee Herter to self quarantine.  Stated she just sent a MyChart message with same request.  Advised will send note to Dr. Carollee Herter.

## 2018-04-08 NOTE — Telephone Encounter (Signed)
Ok to give her note that she can work from home due to covid 19 and her being at higher risk

## 2018-04-08 NOTE — Telephone Encounter (Signed)
Do you still want to do 2 weeks out at a time on covid out of work notes?

## 2018-04-08 NOTE — Telephone Encounter (Signed)
mychart sent.

## 2018-04-15 NOTE — Telephone Encounter (Signed)
Give 1 month

## 2018-04-16 ENCOUNTER — Encounter: Payer: Self-pay | Admitting: *Deleted

## 2018-04-18 ENCOUNTER — Encounter: Payer: Self-pay | Admitting: Family Medicine

## 2018-04-22 ENCOUNTER — Encounter: Payer: Self-pay | Admitting: Family Medicine

## 2018-04-22 NOTE — Telephone Encounter (Signed)
We should get her set up for a virtual at this point now right.?

## 2018-04-22 NOTE — Telephone Encounter (Signed)
Copied from [place] [id number]. Topic: General - Other >> Apr 22, 2018 10:13 AM Carolyn Stare wrote:  Pt said her job is sending over paper work for short term disability. She need to have work from home removed and need to wrote out till 05/17/2018, pt said can leave message

## 2018-04-22 NOTE — Telephone Encounter (Signed)
Noted  Will be looking for paperwork

## 2018-04-30 ENCOUNTER — Telehealth: Payer: Self-pay | Admitting: *Deleted

## 2018-04-30 NOTE — Telephone Encounter (Signed)
Received FMLA/STD paperwork from [facility] [employer], completed as much as possible for short-term continuous disability; forwarded to provider/SLS 04/28

## 2018-05-03 DIAGNOSIS — Z0279 Encounter for issue of other medical certificate: Secondary | ICD-10-CM

## 2018-05-30 ENCOUNTER — Other Ambulatory Visit: Payer: Self-pay | Admitting: Family Medicine

## 2018-06-04 ENCOUNTER — Telehealth: Payer: Self-pay | Admitting: Family Medicine

## 2018-06-04 NOTE — Telephone Encounter (Signed)
LVM for pt to call office and schedule a VOV with provider for medication refill.

## 2018-06-07 ENCOUNTER — Telehealth: Payer: Self-pay

## 2018-06-07 NOTE — Telephone Encounter (Signed)
Copied from [place] [id number]. Topic: Appointment Scheduling - Scheduling Inquiry for Clinic >> Jun 06, 2018  4:41 PM Blase Mess A wrote: Reason for CRM: patient is calling to schedule a medication refill appointment. [phone] (M)

## 2018-06-10 ENCOUNTER — Encounter: Payer: Self-pay | Admitting: Family Medicine

## 2018-06-10 ENCOUNTER — Telehealth: Payer: Self-pay | Admitting: Family Medicine

## 2018-06-10 ENCOUNTER — Ambulatory Visit (INDEPENDENT_AMBULATORY_CARE_PROVIDER_SITE_OTHER): Payer: Self-pay | Admitting: Family Medicine

## 2018-06-10 DIAGNOSIS — E1151 Type 2 diabetes mellitus with diabetic peripheral angiopathy without gangrene: Secondary | ICD-10-CM

## 2018-06-10 DIAGNOSIS — E1165 Type 2 diabetes mellitus with hyperglycemia: Secondary | ICD-10-CM

## 2018-06-10 DIAGNOSIS — E1169 Type 2 diabetes mellitus with other specified complication: Secondary | ICD-10-CM

## 2018-06-10 DIAGNOSIS — E785 Hyperlipidemia, unspecified: Secondary | ICD-10-CM

## 2018-06-10 DIAGNOSIS — IMO0002 Reserved for concepts with insufficient information to code with codable children: Secondary | ICD-10-CM

## 2018-06-10 MED ORDER — SITAGLIPTIN PHOSPHATE 100 MG PO TABS: ORAL_TABLET | ORAL | 1 refills | Status: DC

## 2018-06-10 NOTE — Telephone Encounter (Signed)
Copied from [place] [phone]. Topic: Quick Communication - Rx Refill/Question >> Jun 10, 2018  9:39 AM Margot Ables wrote: Medication: JANUVIA 100 MG tablet  - pt used last dose and has none for tonight - pt transferred to scheduling line  Has the patient contacted their pharmacy? Yes - nothing received - appt needed  Preferred Pharmacy (with phone number or street name): [id number] [place], [place]. AT [address] [phone] (Phone) [phone] (Fax)    Agent: Please be advised that RX refills may take up to 3 business days. We ask that you follow-up with your pharmacy.

## 2018-06-10 NOTE — Telephone Encounter (Signed)
Virtual visit scheduled.  

## 2018-06-10 NOTE — Progress Notes (Signed)
Virtual Visit via Video Note  I connected with Vickie Nichols on 06/10/18 at  1:00 PM EDT by a video enabled telemedicine application and verified that I am speaking with the correct person using two identifiers.  Location: Patient: home Provider: home    I discussed the limitations of evaluation and management by telemedicine and the availability of in person appointments. The patient expressed understanding and agreed to proceed.  History of Present Illness: Pt is home with no complaints.  She needs refills and f/u HYPERTENSION   Blood pressure range-not checking   Chest pain- no      Dyspnea- no Lightheadedness- no   Edema- no  Other side effects - no   Medication compliance: good Low salt diet- no    DIABETES    Blood Sugar ranges-not checking   Polyuria- mo New Visual problems- no  Hypoglycemic symptoms- no  Other side effects-no Medication compliance - good   HYPERLIPIDEMIA  Medication compliance- good RUQ pain- no  Muscle aches- no Other side effects-no    Past Medical History:  Diagnosis Date  . Anxiety   . Depression   . Postmenopausal    Current Outpatient Medications on File Prior to Visit  Medication Sig Dispense Refill  . ALPRAZolam (XANAX) 0.25 MG tablet Take 1 tablet (0.25 mg total) by mouth 3 (three) times daily as needed for anxiety. 45 tablet 0  . buPROPion (WELLBUTRIN XL) 300 MG 24 hr tablet TAKE 1 TABLET (300 MG TOTAL) BY MOUTH DAILY. PATIENT NEEDS OV BEFORE ANY MORE REFILLS 90 tablet 1   No current facility-administered medications on file prior to visit.    Observations/Objective: Not vitals obtained Pt in NAD  Assessment and Plan: 1. Type 2 diabetes mellitus with hyperglycemia, unspecified whether long term insulin use ([place]) hgba1c to be checked.   minimize simple carbs. Increase exercise as tolerated. Continue current meds  - Lipid panel; Future - Hemoglobin A1c; Future - Comprehensive metabolic panel; Future - Microalbumin /  creatinine urine ratio; Future  2. Hyperlipidemia associated with type 2 diabetes mellitus ([place]) Encouraged heart healthy diet, increase exercise, avoid trans fats, consider a krill oil cap daily - Lipid panel; Future - Hemoglobin A1c; Future - Comprehensive metabolic panel; Future - Microalbumin / creatinine urine ratio; Future  3. DM (diabetes mellitus) type II uncontrolled, periph vascular disorder (HCC)  - sitaGLIPtin (JANUVIA) 100 MG tablet; TAKE 1 TABLET (100 MG TOTAL) BY MOUTH DAILY.  Dispense: 90 tablet; Refill: 1   Follow Up Instructions:    I discussed the assessment and treatment plan with the patient. The patient was provided an opportunity to ask questions and all were answered. The patient agreed with the plan and demonstrated an understanding of the instructions.   The patient was advised to call back or seek an in-person evaluation if the symptoms worsen or if the condition fails to improve as anticipated.  I provided 25 minutes of non-face-to-face time during this encounter.   Ann Held, DO

## 2018-06-12 ENCOUNTER — Other Ambulatory Visit: Payer: Self-pay | Admitting: Family Medicine

## 2018-06-12 DIAGNOSIS — E1151 Type 2 diabetes mellitus with diabetic peripheral angiopathy without gangrene: Secondary | ICD-10-CM

## 2018-06-12 DIAGNOSIS — IMO0002 Reserved for concepts with insufficient information to code with codable children: Secondary | ICD-10-CM

## 2018-06-21 ENCOUNTER — Other Ambulatory Visit (INDEPENDENT_AMBULATORY_CARE_PROVIDER_SITE_OTHER): Payer: Self-pay

## 2018-06-21 ENCOUNTER — Other Ambulatory Visit: Payer: Self-pay

## 2018-06-21 DIAGNOSIS — E1169 Type 2 diabetes mellitus with other specified complication: Secondary | ICD-10-CM

## 2018-06-21 DIAGNOSIS — E785 Hyperlipidemia, unspecified: Secondary | ICD-10-CM

## 2018-06-21 DIAGNOSIS — E1165 Type 2 diabetes mellitus with hyperglycemia: Secondary | ICD-10-CM

## 2018-06-21 LAB — COMPREHENSIVE METABOLIC PANEL
ALT: 13 U/L (ref 0–35)
AST: 12 U/L (ref 0–37)
Albumin: 4.2 g/dL (ref 3.5–5.2)
Alkaline Phosphatase: 75 U/L (ref 39–117)
BUN: 11 mg/dL (ref 6–23)
CO2: 28 mEq/L (ref 19–32)
Calcium: 9.2 mg/dL (ref 8.4–10.5)
Chloride: 105 mEq/L (ref 96–112)
Creatinine, Ser: 0.87 mg/dL (ref 0.40–1.20)
GFR: 65.79 mL/min (ref >60.00)
Glucose, Bld: 125 mg/dL — ABNORMAL HIGH (ref 70–99)
Potassium: 4.6 mEq/L (ref 3.5–5.1)
Sodium: 141 mEq/L (ref 135–145)
Total Bilirubin: 0.4 mg/dL (ref 0.2–1.2)
Total Protein: 6.3 g/dL (ref 6.0–8.3)

## 2018-06-21 LAB — LIPID PANEL
Cholesterol: 171 mg/dL (ref 0–200)
HDL: 39.9 mg/dL (ref >39.00)
LDL Cholesterol: 106 mg/dL — ABNORMAL HIGH (ref 0–99)
NonHDL: 131.19
Total CHOL/HDL Ratio: 4
Triglycerides: 126 mg/dL (ref 0.0–149.0)
VLDL: 25.2 mg/dL (ref 0.0–40.0)

## 2018-06-21 LAB — MICROALBUMIN / CREATININE URINE RATIO
Creatinine,U: 140.4 mg/dL
Microalb Creat Ratio: 0.5 mg/g (ref 0.0–30.0)
Microalb, Ur: 0.7 mg/dL (ref 0.0–1.9)

## 2018-06-21 LAB — HEMOGLOBIN A1C: Hgb A1c MFr Bld: 6.5 % (ref 4.6–6.5)

## 2018-07-29 ENCOUNTER — Other Ambulatory Visit: Payer: Self-pay | Admitting: Family Medicine

## 2018-08-12 ENCOUNTER — Encounter: Payer: Self-pay | Admitting: Gastroenterology

## 2018-10-04 ENCOUNTER — Encounter: Payer: Self-pay | Admitting: Family Medicine

## 2018-10-28 ENCOUNTER — Other Ambulatory Visit: Payer: Self-pay

## 2018-10-29 ENCOUNTER — Other Ambulatory Visit (HOSPITAL_COMMUNITY)
Admission: RE | Admit: 2018-10-29 | Discharge: 2018-10-29 | Disposition: A | Discharge status: Home / Self Care | Payer: 59 | Source: Ambulatory Visit | Attending: Family Medicine | Admitting: Family Medicine

## 2018-10-29 ENCOUNTER — Encounter: Payer: Self-pay | Admitting: Family Medicine

## 2018-10-29 ENCOUNTER — Other Ambulatory Visit: Payer: Self-pay

## 2018-10-29 ENCOUNTER — Ambulatory Visit (INDEPENDENT_AMBULATORY_CARE_PROVIDER_SITE_OTHER): Payer: 59 | Admitting: Family Medicine

## 2018-10-29 VITALS — BP 130/78 | HR 77 | Temp 97.2°F | Resp 18 | Ht 69.0 in | Wt 224.6 lb

## 2018-10-29 DIAGNOSIS — E1151 Type 2 diabetes mellitus with diabetic peripheral angiopathy without gangrene: Secondary | ICD-10-CM | POA: Diagnosis not present

## 2018-10-29 DIAGNOSIS — Z0000 Encounter for general adult medical examination without abnormal findings: Secondary | ICD-10-CM | POA: Insufficient documentation

## 2018-10-29 DIAGNOSIS — Z1231 Encounter for screening mammogram for malignant neoplasm of breast: Secondary | ICD-10-CM

## 2018-10-29 DIAGNOSIS — E2839 Other primary ovarian failure: Secondary | ICD-10-CM

## 2018-10-29 DIAGNOSIS — F320 Major depressive disorder, single episode, mild: Secondary | ICD-10-CM | POA: Diagnosis not present

## 2018-10-29 DIAGNOSIS — Z23 Encounter for immunization: Secondary | ICD-10-CM

## 2018-10-29 DIAGNOSIS — Z1211 Encounter for screening for malignant neoplasm of colon: Secondary | ICD-10-CM

## 2018-10-29 DIAGNOSIS — IMO0002 Reserved for concepts with insufficient information to code with codable children: Secondary | ICD-10-CM

## 2018-10-29 DIAGNOSIS — E1165 Type 2 diabetes mellitus with hyperglycemia: Secondary | ICD-10-CM

## 2018-10-29 DIAGNOSIS — R002 Palpitations: Secondary | ICD-10-CM

## 2018-10-29 MED ORDER — SITAGLIPTIN PHOSPHATE 100 MG PO TABS: ORAL_TABLET | ORAL | 1 refills | Status: DC

## 2018-10-29 MED ORDER — BUPROPION HCL ER (XL) 300 MG PO TB24: ORAL_TABLET | ORAL | 1 refills | Status: DC

## 2018-10-29 NOTE — Progress Notes (Signed)
Subjective:     Vickie Nichols is a 63 y.o. female and is here for a comprehensive physical exam. The patient reports problems - b/l hip pain x 6 months--- no known injury . She also needs refills  Pt still c/o fast heart beat --- she was in the er last year with it and it still occurs on occasion.    HYPERTENSION   Blood pressure range-not checking   Chest pain- no      Dyspnea- no Lightheadedness- no   Edema- no  Other side effects - no   Medication compliance: good Low salt diet- yes    DIABETES    Blood Sugar ranges-good per pt   Polyuria- no New Visual problems- no  Hypoglycemic symptoms- no  Other side effects-no Medication compliance - good Last eye exam- due   HYPERLIPIDEMIA  Medication compliance- good RUQ pain- no  Muscle aches- no Other side effects-no       Social History   Socioeconomic History  . Marital status: Widowed    Spouse name: Not on file  . Number of children: Not on file  . Years of education: Not on file  . Highest education level: Not on file  Occupational History  . Not on file  Social Needs  . Financial resource strain: Not on file  . Food insecurity    Worry: Not on file    Inability: Not on file  . Transportation needs    Medical: Not on file    Non-medical: Not on file  Tobacco Use  . Smoking status: Former Smoker    Types: Cigarettes    Quit date: 02/03/2012    Years since quitting: 6.7  . Smokeless tobacco: Never Used  . Tobacco comment: uses e cigarettes  Substance and Sexual Activity  . Alcohol use: Yes    Comment: rare  . Drug use: No  . Sexual activity: Not Currently  Lifestyle  . Physical activity    Days per week: Not on file    Minutes per session: Not on file  . Stress: Not on file  Relationships  . Social Herbalist on phone: Not on file    Gets together: Not on file    Attends religious service: Not on file    Active member of club or organization: Not on file    Attends meetings of clubs  or organizations: Not on file    Relationship status: Not on file  . Intimate partner violence    Fear of current or ex partner: Not on file    Emotionally abused: Not on file    Physically abused: Not on file    Forced sexual activity: Not on file  Other Topics Concern  . Not on file  Social History Narrative  . Not on file   Health Maintenance  Topic Date Due  . Hepatitis C Screening  08/24/1955  . HIV Screening  08/15/1970  . OPHTHALMOLOGY EXAM  09/03/2013  . PAP SMEAR-Modifier  06/29/2015  . MAMMOGRAM  10/17/2017  . COLONOSCOPY  09/13/2018  . HEMOGLOBIN A1C  12/21/2018  . URINE MICROALBUMIN  06/21/2019  . FOOT EXAM  10/29/2019  . TETANUS/TDAP  06/14/2021  . INFLUENZA VACCINE  Completed  . PNEUMOCOCCAL POLYSACCHARIDE VACCINE AGE 77-64 HIGH RISK  Completed    The following portions of the patient's history were reviewed and updated as appropriate:  She  has a past medical history of Anxiety, Depression, and Postmenopausal. She does not have  any pertinent problems on file. She  has a past surgical history that includes Cesarean section; Tubal ligation; and Tonsillectomy. Her family history includes Cancer in her maternal aunt, maternal grandfather, and maternal grandmother; Colon cancer in her maternal aunt; Coronary artery disease (age of onset: 75) in an other family member; Diabetes in her paternal grandfather; Hypertension in her paternal grandfather; Lung disease in her father; Obesity in her brother; Stroke in her paternal grandfather. She  reports that she quit smoking about 6 years ago. Her smoking use included cigarettes. She has never used smokeless tobacco. She reports current alcohol use. She reports that she does not use drugs. She has a current medication list which includes the following prescription(s): bupropion, sitagliptin, and alprazolam. Current Outpatient Medications on File Prior to Visit  Medication Sig Dispense Refill  . ALPRAZolam (XANAX) 0.25 MG tablet  Take 1 tablet (0.25 mg total) by mouth 3 (three) times daily as needed for anxiety. (Patient not taking: Reported on 10/29/2018) 45 tablet 0   No current facility-administered medications on file prior to visit.    She has No Known Allergies..  Review of Systems Review of Systems  Constitutional: Negative for activity change, appetite change and fatigue.  HENT: Negative for hearing loss, congestion, tinnitus and ear discharge.  dentist q32m Eyes: Negative for visual disturbance (see optho q1y -- vision corrected to 20/20 with glasses).  Respiratory: Negative for cough, chest tightness and shortness of breath.   Cardiovascular: Negative for chest pain, palpitations and leg swelling.  Gastrointestinal: Negative for abdominal pain, diarrhea, constipation and abdominal distention.  Genitourinary: Negative for urgency, frequency, decreased urine volume and difficulty urinating.  Musculoskeletal: Negative for back pain, arthralgias and gait problem.  Skin: Negative for color change, pallor and rash.  Neurological: Negative for dizziness, light-headedness, numbness and headaches.  Hematological: Negative for adenopathy. Does not bruise/bleed easily.  Psychiatric/Behavioral: Negative for suicidal ideas, confusion, sleep disturbance, self-injury, dysphoric mood, decreased concentration and agitation.       Objective:    BP 130/78 (BP Location: Right Arm, Patient Position: Sitting, Cuff Size: Normal)   Pulse 77   Temp (!) 97.2 F (36.2 C) (Temporal)   Resp 18   Ht 5\' 9"  (1.753 m)   Wt 224 lb 9.6 oz (101.9 kg)   SpO2 97%   BMI 33.17 kg/m  General appearance: alert, cooperative, appears stated age and no distress Head: Normocephalic, without obvious abnormality, atraumatic Eyes: conjunctivae/corneas clear. PERRL, EOM's intact. Fundi benign. Ears: normal TM's and external ear canals both ears Nose: Nares normal. Septum midline. Mucosa normal. No drainage or sinus tenderness. Throat: lips,  mucosa, and tongue normal; teeth and gums normal Neck: no adenopathy, no carotid bruit, no JVD, supple, symmetrical, trachea midline and thyroid not enlarged, symmetric, no tenderness/mass/nodules Back: symmetric, no curvature. ROM normal. No CVA tenderness. Lungs: clear to auscultation bilaterally Breasts: normal appearance, no masses or tenderness Heart: regular rate and rhythm, S1, S2 normal, no murmur, click, rub or gallop Abdomen: soft, non-tender; bowel sounds normal; no masses,  no organomegaly Pelvic: cervix normal in appearance, external genitalia normal, no adnexal masses or tenderness, no cervical motion tenderness, rectovaginal septum normal, uterus normal size, shape, and consistency and vagina normal without discharge Extremities: extremities normal, atraumatic, no cyanosis or edema Pulses: 2+ and symmetric Skin: Skin color, texture, turgor normal. No rashes or lesions Lymph nodes: Cervical, supraclavicular, and axillary nodes normal. Neurologic: Alert and oriented X 3, normal strength and tone. Normal symmetric reflexes. Normal coordination and gait  Assessment:    Healthy female exam.      Plan:    ghm utd Check labs See After Visit Summary for Counseling Recommendations    1. DM (diabetes mellitus) type II uncontrolled, periph vascular disorder ([place]) hgba1c to be checked , minimize simple carbs. Increase exercise as tolerated. Continue current meds  - sitaGLIPtin (JANUVIA) 100 MG tablet; TAKE 1 TABLET BY MOUTH EVERY DAY  Dispense: 90 tablet; Refill: 1  2. Need for influenza vaccination   - Flu Vaccine QUAD 36+ mos IM  3. Depression, major, single episode, mild ([facility]) Stable con't meds  - buPROPion (WELLBUTRIN XL) 300 MG 24 hr tablet; TAKE 1 TABLET BY MOUTH DAILY. PATIENT NEEDS OV BEFORE ANY MORE REFILLS  Dispense: 90 tablet; Refill: 1  4. Palpitations She may wait on cardiology  - Ambulatory referral to Cardiology - ECHOCARDIOGRAM COMPLETE; Future  5.  Encounter for screening mammogram for malignant neoplasm of breast  - MM 3D SCREEN BREAST BILATERAL; Future  6. Estrogen deficiency   - DG Bone Density; Future  7. Colon cancer screening   - Ambulatory referral to Gastroenterology  8. Preventative health care See above - Cytology - PAP( [place])

## 2018-10-29 NOTE — Patient Instructions (Signed)

## 2018-11-04 LAB — CYTOLOGY - PAP
Chlamydia: NEGATIVE
Comment: NEGATIVE
Comment: NEGATIVE
Comment: NEGATIVE
Comment: NORMAL
Diagnosis: NEGATIVE
HSV1: NEGATIVE
HSV2: NEGATIVE
Neisseria Gonorrhea: NEGATIVE
Trichomonas: NEGATIVE

## 2018-11-05 ENCOUNTER — Encounter: Payer: Self-pay | Admitting: Cardiology

## 2019-01-14 ENCOUNTER — Encounter: Payer: Self-pay | Admitting: Family Medicine

## 2019-03-24 ENCOUNTER — Encounter: Payer: Self-pay | Admitting: Family Medicine

## 2019-03-24 NOTE — Telephone Encounter (Signed)
Pt would like list of comparable meds to Januvia that could be cheaper and a option for her to take.

## 2019-04-01 ENCOUNTER — Other Ambulatory Visit: Payer: Self-pay | Admitting: Family Medicine

## 2019-04-01 DIAGNOSIS — IMO0002 Reserved for concepts with insufficient information to code with codable children: Secondary | ICD-10-CM

## 2019-04-01 DIAGNOSIS — E1151 Type 2 diabetes mellitus with diabetic peripheral angiopathy without gangrene: Secondary | ICD-10-CM

## 2019-04-02 ENCOUNTER — Encounter: Payer: Self-pay | Admitting: Family Medicine

## 2019-04-02 DIAGNOSIS — IMO0002 Reserved for concepts with insufficient information to code with codable children: Secondary | ICD-10-CM

## 2019-04-02 DIAGNOSIS — E1151 Type 2 diabetes mellitus with diabetic peripheral angiopathy without gangrene: Secondary | ICD-10-CM

## 2019-04-02 MED ORDER — SITAGLIPTIN PHOSPHATE 100 MG PO TABS: ORAL_TABLET | ORAL | 1 refills | Status: DC

## 2019-04-02 NOTE — Telephone Encounter (Signed)
Patient called in to see if DR. Lowne could send in a prescription for  sitaGLIPtin (JANUVIA) 100 MG tablet [id number]    [id number] [place], [place]. AT [address] [place]  [address] [place] [address]  Phone:  [phone] Fax:  [phone]  DEA #:  [id number]

## 2019-08-03 ENCOUNTER — Other Ambulatory Visit: Payer: Self-pay | Admitting: Family Medicine

## 2019-08-03 DIAGNOSIS — F320 Major depressive disorder, single episode, mild: Secondary | ICD-10-CM

## 2019-08-04 ENCOUNTER — Other Ambulatory Visit: Payer: Self-pay

## 2019-09-01 ENCOUNTER — Encounter: Payer: Self-pay | Admitting: Gastroenterology

## 2019-09-02 ENCOUNTER — Other Ambulatory Visit: Payer: Self-pay | Admitting: Family Medicine

## 2019-09-02 DIAGNOSIS — Z1231 Encounter for screening mammogram for malignant neoplasm of breast: Secondary | ICD-10-CM

## 2019-09-04 ENCOUNTER — Ambulatory Visit
Admission: RE | Admit: 2019-09-04 | Discharge: 2019-09-04 | Disposition: A | Discharge status: Home / Self Care | Payer: Self-pay | Source: Ambulatory Visit

## 2019-09-04 ENCOUNTER — Other Ambulatory Visit: Payer: Self-pay

## 2019-09-04 DIAGNOSIS — Z1231 Encounter for screening mammogram for malignant neoplasm of breast: Secondary | ICD-10-CM

## 2019-09-05 ENCOUNTER — Ambulatory Visit (AMBULATORY_SURGERY_CENTER): Payer: Self-pay | Admitting: *Deleted

## 2019-09-05 ENCOUNTER — Other Ambulatory Visit: Payer: Self-pay

## 2019-09-05 VITALS — Ht 69.0 in | Wt 230.0 lb

## 2019-09-05 DIAGNOSIS — Z86010 Personal history of colonic polyps: Secondary | ICD-10-CM

## 2019-09-05 MED ORDER — NA SULFATE-K SULFATE-MG SULF 17.5-3.13-1.6 GM/177ML PO SOLN: 1.0000 | Freq: Once | ORAL | 0 refills | Status: AC

## 2019-09-05 NOTE — Progress Notes (Signed)

## 2019-10-03 ENCOUNTER — Other Ambulatory Visit: Payer: Self-pay | Admitting: Family Medicine

## 2019-10-03 DIAGNOSIS — IMO0002 Reserved for concepts with insufficient information to code with codable children: Secondary | ICD-10-CM

## 2019-10-22 ENCOUNTER — Other Ambulatory Visit: Payer: Self-pay

## 2019-10-22 ENCOUNTER — Encounter: Payer: Self-pay | Admitting: Gastroenterology

## 2019-10-22 ENCOUNTER — Ambulatory Visit (AMBULATORY_SURGERY_CENTER): Payer: 59 | Admitting: Gastroenterology

## 2019-10-22 VITALS — BP 115/70 | HR 64 | Temp 97.3°F | Resp 21 | Ht 69.0 in | Wt 230.0 lb

## 2019-10-22 DIAGNOSIS — Z86010 Personal history of colonic polyps: Secondary | ICD-10-CM | POA: Diagnosis not present

## 2019-10-22 DIAGNOSIS — D128 Benign neoplasm of rectum: Secondary | ICD-10-CM

## 2019-10-22 DIAGNOSIS — K621 Rectal polyp: Secondary | ICD-10-CM | POA: Diagnosis not present

## 2019-10-22 DIAGNOSIS — K635 Polyp of colon: Secondary | ICD-10-CM

## 2019-10-22 DIAGNOSIS — D123 Benign neoplasm of transverse colon: Secondary | ICD-10-CM

## 2019-10-22 DIAGNOSIS — D125 Benign neoplasm of sigmoid colon: Secondary | ICD-10-CM

## 2019-10-22 DIAGNOSIS — D127 Benign neoplasm of rectosigmoid junction: Secondary | ICD-10-CM | POA: Diagnosis not present

## 2019-10-22 HISTORY — PX: COLONOSCOPY: SHX174

## 2019-10-22 MED ORDER — SODIUM CHLORIDE 0.9 % IV SOLN: 500.0000 mL | Freq: Once | INTRAVENOUS | Status: DC

## 2019-10-22 NOTE — Patient Instructions (Signed)
Information  On polyps, diverticulosis and hemorrhoids given to you today.  Await pathology results.  Continue present medications and diet.  Eat a high fiber diet.  YOU HAD AN ENDOSCOPIC PROCEDURE TODAY AT [facility]:   Refer to the procedure report that was given to you for any specific questions about what was found during the examination.  If the procedure report does not answer your questions, please call your gastroenterologist to clarify.  If you requested that your care partner not be given the details of your procedure findings, then the procedure report has been included in a sealed envelope for you to review at your convenience later.  YOU SHOULD EXPECT: Some feelings of bloating in the abdomen. Passage of more gas than usual.  Walking can help get rid of the air that was put into your GI tract during the procedure and reduce the bloating. If you had a lower endoscopy (such as a colonoscopy or flexible sigmoidoscopy) you may notice spotting of blood in your stool or on the toilet paper. If you underwent a bowel prep for your procedure, you may not have a normal bowel movement for a few days.  Please Note:  You might notice some irritation and congestion in your nose or some drainage.  This is from the oxygen used during your procedure.  There is no need for concern and it should clear up in a day or so.  SYMPTOMS TO REPORT IMMEDIATELY:   Following lower endoscopy (colonoscopy or flexible sigmoidoscopy):  Excessive amounts of blood in the stool  Significant tenderness or worsening of abdominal pains  Swelling of the abdomen that is new, acute  Fever of 100F or higher   For urgent or emergent issues, a gastroenterologist can be reached at any hour by calling [phone]. Do not use MyChart messaging for urgent concerns.    DIET:  We do recommend a small meal at first, but then you may proceed to your regular diet.  Drink plenty of fluids but you should avoid  alcoholic beverages for 24 hours.  ACTIVITY:  You should plan to take it easy for the rest of today and you should NOT DRIVE or use heavy machinery until tomorrow (because of the sedation medicines used during the test).    FOLLOW UP: Our staff will call the number listed on your records 48-72 hours following your procedure to check on you and address any questions or concerns that you may have regarding the information given to you following your procedure. If we do not reach you, we will leave a message.  We will attempt to reach you two times.  During this call, we will ask if you have developed any symptoms of COVID 19. If you develop any symptoms (ie: fever, flu-like symptoms, shortness of breath, cough etc.) before then, please call [phone].  If you test positive for Covid 19 in the 2 weeks post procedure, please call and report this information to [place].    If any biopsies were taken you will be contacted by phone or by letter within the next 1-3 weeks.  Please call us at [phone] if you have not heard about the biopsies in 3 weeks.    SIGNATURES/CONFIDENTIALITY: You and/or your care partner have signed paperwork which will be entered into your electronic medical record.  These signatures attest to the fact that that the information above on your After Visit Summary has been reviewed and is understood.  Full responsibility of the confidentiality  of this discharge information lies with you and/or your care-partner.

## 2019-10-22 NOTE — Progress Notes (Signed)
A and O x3. Report to RN. Tolerated MAC anesthesia well.

## 2019-10-22 NOTE — Progress Notes (Signed)
Pt's states no medical or surgical changes since previsit or office visit.  Vitals SM 

## 2019-10-22 NOTE — Op Note (Signed)
[facility] Patient Name: Vickie Nichols Procedure Date: 10/22/2019 9:33 AM MRN: [id number] Endoscopist: Ladene Artist , MD Age: 64 Referring MD:  Date of Birth: August 10, 1955 Gender: Female Account #: [id number] Procedure:                Colonoscopy Indications:              Surveillance: Personal history of adenomatous                            polyps on last colonoscopy > 5 years ago Medicines:                Monitored Anesthesia Care Procedure:                Pre-Anesthesia Assessment:                           - Prior to the procedure, a History and Physical                            was performed, and patient medications and                            allergies were reviewed. The patient's tolerance of                            previous anesthesia was also reviewed. The risks                            and benefits of the procedure and the sedation                            options and risks were discussed with the patient.                            All questions were answered, and informed consent                            was obtained. Prior Anticoagulants: The patient has                            taken no previous anticoagulant or antiplatelet                            agents. ASA Grade Assessment: II - A patient with                            mild systemic disease. After reviewing the risks                            and benefits, the patient was deemed in                            satisfactory condition to undergo the procedure.  After obtaining informed consent, the colonoscope                            was passed under direct vision. Throughout the                            procedure, the patient's blood pressure, pulse, and                            oxygen saturations were monitored continuously. The                            Colonoscope was introduced through the anus and                            advanced to the the  cecum, identified by                            appendiceal orifice and ileocecal valve. The                            ileocecal valve, appendiceal orifice, and rectum                            were photographed. The quality of the bowel                            preparation was good. The colonoscopy was performed                            without difficulty. The patient tolerated the                            procedure well. Scope In: 9:41:37 AM Scope Out: 10:01:15 AM Scope Withdrawal Time: 0 hours 15 minutes 51 seconds  Total Procedure Duration: 0 hours 19 minutes 38 seconds  Findings:                 The perianal and digital rectal examinations were                            normal.                           Five sessile polyps were found in the rectum (2),                            sigmoid colon (2) and transverse colon (1). The                            polyps were 6 to 8 mm in size. These polyps were                            removed with a cold snare. Resection and retrieval  were complete.                           Multiple small-mouthed diverticula were found in                            the left colon. There was no evidence of                            diverticular bleeding.                           Internal hemorrhoids were found during                            retroflexion. The hemorrhoids were small and Grade                            I (internal hemorrhoids that do not prolapse).                           The exam was otherwise without abnormality on                            direct and retroflexion views. Complications:            No immediate complications. Estimated blood loss:                            None. Estimated Blood Loss:     Estimated blood loss: none. Impression:               - Five 6 to 8 mm polyps in the rectum, in the                            sigmoid colon and in the transverse colon, removed                             with a cold snare. Resected and retrieved.                           - Moderate diverticulosis in the left colon. There                            was no evidence of diverticular bleeding.                           - Internal hemorrhoids.                           - The examination was otherwise normal on direct                            and retroflexion views. Recommendation:           - Repeat colonoscopy after studies are complete for  surveillance based on pathology results.                           - Patient has a contact number available for                            emergencies. The signs and symptoms of potential                            delayed complications were discussed with the                            patient. Return to normal activities tomorrow.                            Written discharge instructions were provided to the                            patient.                           - High fiber diet.                           - Continue present medications.                           - Await pathology results. Ladene Artist, MD 10/22/2019 10:05:05 AM This report has been signed electronically.

## 2019-10-22 NOTE — Progress Notes (Signed)
Called to room to assist during endoscopic procedure.  Patient ID and intended procedure confirmed with present staff. Received instructions for my participation in the procedure from the performing physician.  

## 2019-10-24 ENCOUNTER — Telehealth: Payer: Self-pay

## 2019-10-24 NOTE — Telephone Encounter (Signed)
°  Follow up Call-  Call back number 10/22/2019  Post procedure Call Back phone  # [phone]  Permission to leave phone message Yes  Some recent data might be hidden     Patient questions:  Do you have a fever, pain , or abdominal swelling? No. Pain Score  0 *  Have you tolerated food without any problems? Yes.    Have you been able to return to your normal activities? Yes.    Do you have any questions about your discharge instructions: Diet   No. Medications  No. Follow up visit  No.  Do you have questions or concerns about your Care? No.  Actions: * If pain score is 4 or above: No action needed, pain <4.  1. Have you developed a fever since your procedure? no  2.   Have you had an respiratory symptoms (SOB or cough) since your procedure? no  3.   Have you tested positive for COVID 19 since your procedure no  4.   Have you had any family members/close contacts diagnosed with the COVID 19 since your procedure? no   If yes to any of these questions please route to Joylene John, RN and Joella Prince, RN

## 2019-10-30 ENCOUNTER — Encounter: Payer: Self-pay | Admitting: Gastroenterology

## 2019-11-07 ENCOUNTER — Encounter: Payer: Self-pay | Admitting: Family Medicine

## 2019-11-07 ENCOUNTER — Other Ambulatory Visit: Payer: Self-pay | Admitting: Family Medicine

## 2019-11-07 DIAGNOSIS — IMO0002 Reserved for concepts with insufficient information to code with codable children: Secondary | ICD-10-CM

## 2019-11-07 DIAGNOSIS — E1165 Type 2 diabetes mellitus with hyperglycemia: Secondary | ICD-10-CM

## 2019-11-07 MED ORDER — SITAGLIPTIN PHOSPHATE 100 MG PO TABS: 100.0000 mg | ORAL_TABLET | Freq: Every day | ORAL | 0 refills | Status: DC

## 2019-11-10 ENCOUNTER — Telehealth: Payer: Self-pay | Admitting: Family Medicine

## 2019-11-10 DIAGNOSIS — E1151 Type 2 diabetes mellitus with diabetic peripheral angiopathy without gangrene: Secondary | ICD-10-CM

## 2019-11-10 DIAGNOSIS — IMO0002 Reserved for concepts with insufficient information to code with codable children: Secondary | ICD-10-CM

## 2019-11-10 MED ORDER — SITAGLIPTIN PHOSPHATE 100 MG PO TABS: 100.0000 mg | ORAL_TABLET | Freq: Every day | ORAL | 0 refills | Status: DC

## 2019-11-10 NOTE — Telephone Encounter (Signed)
Pt sent mychart messages   "That appt time will be fine, thank you for putting my in the time slot. I will however need refills if my prescription januvia until I can get in to see her. I believe you already put in a request which will fill the need in November, will just need one more for December. Again, thank you very much!"   "Also can I get scheduled for labs prior to my visit in order for Dr Cheri Rous to review them with me please? "       Please advise

## 2019-11-10 NOTE — Telephone Encounter (Signed)
Refill sent.

## 2019-12-06 ENCOUNTER — Other Ambulatory Visit: Payer: Self-pay | Admitting: Family Medicine

## 2019-12-06 DIAGNOSIS — IMO0002 Reserved for concepts with insufficient information to code with codable children: Secondary | ICD-10-CM

## 2019-12-06 DIAGNOSIS — E1151 Type 2 diabetes mellitus with diabetic peripheral angiopathy without gangrene: Secondary | ICD-10-CM

## 2019-12-06 DIAGNOSIS — E1165 Type 2 diabetes mellitus with hyperglycemia: Secondary | ICD-10-CM

## 2020-01-06 ENCOUNTER — Telehealth: Payer: Self-pay | Admitting: Family Medicine

## 2020-01-06 ENCOUNTER — Other Ambulatory Visit: Payer: Self-pay | Admitting: Family Medicine

## 2020-01-06 ENCOUNTER — Encounter: Payer: 59 | Admitting: Family Medicine

## 2020-01-06 DIAGNOSIS — E1165 Type 2 diabetes mellitus with hyperglycemia: Secondary | ICD-10-CM

## 2020-01-06 DIAGNOSIS — Z0000 Encounter for general adult medical examination without abnormal findings: Secondary | ICD-10-CM

## 2020-01-06 NOTE — Telephone Encounter (Signed)
Called patient to schedule lab appt

## 2020-01-06 NOTE — Telephone Encounter (Signed)
Pt needs lab appt please 

## 2020-01-06 NOTE — Telephone Encounter (Signed)
Patient would like to get annual lab prior to appt on 03/02/19/. Please advise

## 2020-01-06 NOTE — Telephone Encounter (Signed)
Orders in 

## 2020-01-16 ENCOUNTER — Encounter: Payer: Self-pay | Admitting: Family Medicine

## 2020-02-02 ENCOUNTER — Other Ambulatory Visit: Payer: Self-pay | Admitting: Family Medicine

## 2020-02-02 DIAGNOSIS — F320 Major depressive disorder, single episode, mild: Secondary | ICD-10-CM

## 2020-02-27 ENCOUNTER — Other Ambulatory Visit: Payer: Self-pay | Admitting: Family Medicine

## 2020-02-27 DIAGNOSIS — F320 Major depressive disorder, single episode, mild: Secondary | ICD-10-CM

## 2020-03-01 ENCOUNTER — Encounter: Payer: Self-pay | Admitting: Family Medicine

## 2020-03-01 ENCOUNTER — Other Ambulatory Visit: Payer: Self-pay

## 2020-03-01 ENCOUNTER — Ambulatory Visit (INDEPENDENT_AMBULATORY_CARE_PROVIDER_SITE_OTHER): Payer: 59 | Admitting: Family Medicine

## 2020-03-01 VITALS — BP 140/80 | HR 81 | Temp 98.3°F | Resp 18 | Ht 69.0 in | Wt 231.6 lb

## 2020-03-01 DIAGNOSIS — Z23 Encounter for immunization: Secondary | ICD-10-CM | POA: Diagnosis not present

## 2020-03-01 DIAGNOSIS — E1151 Type 2 diabetes mellitus with diabetic peripheral angiopathy without gangrene: Secondary | ICD-10-CM | POA: Diagnosis not present

## 2020-03-01 DIAGNOSIS — F320 Major depressive disorder, single episode, mild: Secondary | ICD-10-CM | POA: Diagnosis not present

## 2020-03-01 DIAGNOSIS — Z0000 Encounter for general adult medical examination without abnormal findings: Secondary | ICD-10-CM | POA: Diagnosis not present

## 2020-03-01 DIAGNOSIS — Z1159 Encounter for screening for other viral diseases: Secondary | ICD-10-CM

## 2020-03-01 DIAGNOSIS — E1165 Type 2 diabetes mellitus with hyperglycemia: Secondary | ICD-10-CM

## 2020-03-01 DIAGNOSIS — D229 Melanocytic nevi, unspecified: Secondary | ICD-10-CM

## 2020-03-01 DIAGNOSIS — IMO0002 Reserved for concepts with insufficient information to code with codable children: Secondary | ICD-10-CM

## 2020-03-01 MED ORDER — BUPROPION HCL ER (XL) 300 MG PO TB24: 300.0000 mg | ORAL_TABLET | Freq: Every day | ORAL | 3 refills | Status: DC

## 2020-03-01 MED ORDER — BLOOD GLUCOSE MONITOR KIT: PACK | 0 refills | Status: DC

## 2020-03-01 MED ORDER — SITAGLIPTIN PHOSPHATE 100 MG PO TABS: 100.0000 mg | ORAL_TABLET | Freq: Every day | ORAL | 3 refills | Status: DC

## 2020-03-01 NOTE — Patient Instructions (Signed)
Preventive Care 84-65 Years Old, Female Preventive care refers to lifestyle choices and visits with your health care provider that can promote health and wellness. This includes:  A yearly physical exam. This is also called an annual wellness visit.  Regular dental and eye exams.  Immunizations.  Screening for certain conditions.  Healthy lifestyle choices, such as: ? Eating a healthy diet. ? Getting regular exercise. ? Not using drugs or products that contain nicotine and tobacco. ? Limiting alcohol use. What can I expect for my preventive care visit? Physical exam Your health care provider will check your:  Height and weight. These may be used to calculate your BMI (body mass index). BMI is a measurement that tells if you are at a healthy weight.  Heart rate and blood pressure.  Body temperature.  Skin for abnormal spots. Counseling Your health care provider may ask you questions about your:  Past medical problems.  Family's medical history.  Alcohol, tobacco, and drug use.  Emotional well-being.  Home life and relationship well-being.  Sexual activity.  Diet, exercise, and sleep habits.  Work and work Statistician.  Access to firearms.  Method of birth control.  Menstrual cycle.  Pregnancy history. What immunizations do I need? Vaccines are usually given at various ages, according to a schedule. Your health care provider will recommend vaccines for you based on your age, medical history, and lifestyle or other factors, such as travel or where you work.   What tests do I need? Blood tests  Lipid and cholesterol levels. These may be checked every 5 years, or more often if you are over 65 years old.  Hepatitis C test.  Hepatitis B test. Screening  Lung cancer screening. You may have this screening every year starting at age 65 if you have a 30-pack-year history of smoking and currently smoke or have quit within the past 15 years.  Colorectal cancer  screening. ? All adults should have this screening starting at age 65 and continuing until age 17. ? Your health care provider may recommend screening at age 49 if you are at increased risk. ? You will have tests every 1-10 years, depending on your results and the type of screening test.  Diabetes screening. ? This is done by checking your blood sugar (glucose) after you have not eaten for a while (fasting). ? You may have this done every 1-3 years.  Mammogram. ? This may be done every 1-2 years. ? Talk with your health care provider about when you should start having regular mammograms. This may depend on whether you have a family history of breast cancer.  BRCA-related cancer screening. This may be done if you have a family history of breast, ovarian, tubal, or peritoneal cancers.  Pelvic exam and Pap test. ? This may be done every 3 years starting at age 65. ? Starting at age 65, this may be done every 5 years if you have a Pap test in combination with an HPV test. Other tests  STD (sexually transmitted disease) testing, if you are at risk.  Bone density scan. This is done to screen for osteoporosis. You may have this scan if you are at high risk for osteoporosis. Talk with your health care provider about your test results, treatment options, and if necessary, the need for more tests. Follow these instructions at home: Eating and drinking  Eat a diet that includes fresh fruits and vegetables, whole grains, lean protein, and low-fat dairy products.  Take vitamin and mineral supplements  as recommended by your health care provider.  Do not drink alcohol if: ? Your health care provider tells you not to drink. ? You are pregnant, may be pregnant, or are planning to become pregnant.  If you drink alcohol: ? Limit how much you have to 0-1 drink a day. ? Be aware of how much alcohol is in your drink. In the [place] one drink equals one 12 oz bottle of beer (355 mL), one 5 oz glass of  wine (148 mL), or one 1 oz glass of hard liquor (44 mL).   Lifestyle  Take daily care of your teeth and gums. Brush your teeth every morning and night with fluoride toothpaste. Floss one time each day.  Stay active. Exercise for at least 30 minutes 5 or more days each week.  Do not use any products that contain nicotine or tobacco, such as cigarettes, e-cigarettes, and chewing tobacco. If you need help quitting, ask your health care provider.  Do not use drugs.  If you are sexually active, practice safe sex. Use a condom or other form of protection to prevent STIs (sexually transmitted infections).  If you do not wish to become pregnant, use a form of birth control. If you plan to become pregnant, see your health care provider for a prepregnancy visit.  If told by your health care provider, take low-dose aspirin daily starting at age 50.  Find healthy ways to cope with stress, such as: ? Meditation, yoga, or listening to music. ? Journaling. ? Talking to a trusted person. ? Spending time with friends and family. Safety  Always wear your seat belt while driving or riding in a vehicle.  Do not drive: ? If you have been drinking alcohol. Do not ride with someone who has been drinking. ? When you are tired or distracted. ? While texting.  Wear a helmet and other protective equipment during sports activities.  If you have firearms in your house, make sure you follow all gun safety procedures. What's next?  Visit your health care provider once a year for an annual wellness visit.  Ask your health care provider how often you should have your eyes and teeth checked.  Stay up to date on all vaccines. This information is not intended to replace advice given to you by your health care provider. Make sure you discuss any questions you have with your health care provider. Document Revised: 09/23/2019 Document Reviewed: 08/30/2017 Elsevier Patient Education  2021 Elsevier Inc.  

## 2020-03-01 NOTE — Progress Notes (Signed)
Subjective:     Vickie Nichols is a 65 y.o. female and is here for a comprehensive physical exam. The patient reports no problems. She will need refills and f/u -----she is requesting derm referral for skin check  Social History   Socioeconomic History  . Marital status: Widowed    Spouse name: Not on file  . Number of children: Not on file  . Years of education: Not on file  . Highest education level: Not on file  Occupational History  . Not on file  Tobacco Use  . Smoking status: Former Smoker    Types: Cigarettes    Quit date: 02/03/2012    Years since quitting: 8.0  . Smokeless tobacco: Never Used  . Tobacco comment: uses e cigarettes  Vaping Use  . Vaping Use: Every day  Substance and Sexual Activity  . Alcohol use: Yes    Comment: rare  . Drug use: No  . Sexual activity: Not Currently  Other Topics Concern  . Not on file  Social History Narrative  . Not on file   Social Determinants of Health   Financial Resource Strain: Not on file  Food Insecurity: Not on file  Transportation Needs: Not on file  Physical Activity: Not on file  Stress: Not on file  Social Connections: Not on file  Intimate Partner Violence: Not on file   Health Maintenance  Topic Date Due  . Hepatitis C Screening  Never done  . HIV Screening  Never done  . OPHTHALMOLOGY EXAM  09/03/2013  . HEMOGLOBIN A1C  12/21/2018  . URINE MICROALBUMIN  06/21/2019  . FOOT EXAM  10/29/2019  . TETANUS/TDAP  06/14/2021  . MAMMOGRAM  09/03/2021  . PAP SMEAR-Modifier  10/28/2021  . COLONOSCOPY (Pts 45-94yr Insurance coverage will need to be confirmed)  10/22/2022  . INFLUENZA VACCINE  Completed  . COVID-19 Vaccine  Completed     The following portions of the patient's history were reviewed and updated as appropriate:  She  has a past medical history of Anxiety, Depression, Diabetes mellitus without complication ([place], and Postmenopausal. She does not have any pertinent problems on file. She  has a  past surgical history that includes Cesarean section; Tubal ligation; Tonsillectomy; and Colonoscopy (10/22/2019). Her family history includes Cancer in her maternal aunt, maternal grandfather, and maternal grandmother; Colon cancer in her maternal aunt and another family member; Coronary artery disease (age of onset: 669 in an other family member; Diabetes in her paternal grandfather; Esophageal cancer in her maternal aunt; Hypertension in her paternal grandfather; Lung disease in her father; Obesity in her brother; Stroke in her paternal grandfather. She  reports that she quit smoking about 8 years ago. Her smoking use included cigarettes. She has never used smokeless tobacco. She reports current alcohol use. She reports that she does not use drugs. She has a current medication list which includes the following prescription(s): blood glucose meter kit and supplies, bupropion, and sitagliptin. No current outpatient medications on file prior to visit.   No current facility-administered medications on file prior to visit.   She has No Known Allergies..  Review of Systems Review of Systems  Constitutional: Negative for activity change, appetite change and fatigue.  HENT: Negative for hearing loss, congestion, tinnitus and ear discharge.  dentist [id number]: Negative for visual disturbance (see optho q1y -- vision corrected to 20/20 with glasses).  Respiratory: Negative for cough, chest tightness and shortness of breath.   Cardiovascular: Negative for chest pain, palpitations and  leg swelling.  Gastrointestinal: Negative for abdominal pain, diarrhea, constipation and abdominal distention.  Genitourinary: Negative for urgency, frequency, decreased urine volume and difficulty urinating.  Musculoskeletal: Negative for back pain, arthralgias and gait problem.  Skin: Negative for color change, pallor and rash.  Neurological: Negative for dizziness, light-headedness, numbness and headaches.   Hematological: Negative for adenopathy. Does not bruise/bleed easily.  Psychiatric/Behavioral: Negative for suicidal ideas, confusion, sleep disturbance, self-injury, dysphoric mood, decreased concentration and agitation.       Objective:    BP 140/80 (BP Location: Right Arm, Patient Position: Sitting, Cuff Size: Large)   Pulse 81   Temp 98.3 F (36.8 C) (Oral)   Resp 18   Ht 5' 9"  (1.753 m)   Wt 231 lb 9.6 oz (105.1 kg)   SpO2 98%   BMI 34.20 kg/m  General appearance: alert, cooperative, appears stated age and no distress Head: Normocephalic, without obvious abnormality, atraumatic Eyes: negative findings: lids and lashes normal, conjunctivae and sclerae normal and pupils equal, round, reactive to light and accomodation Ears: normal TM's and external ear canals both ears Nose: Nares normal. Septum midline. Mucosa normal. No drainage or sinus tenderness. Throat: lips, mucosa, and tongue normal; teeth and gums normal Neck: no adenopathy, no carotid bruit, no JVD, supple, symmetrical, trachea midline and thyroid not enlarged, symmetric, no tenderness/mass/nodules Back: symmetric, no curvature. ROM normal. No CVA tenderness. Lungs: clear to auscultation bilaterally Breasts: normal appearance, no masses or tenderness Heart: regular rate and rhythm, S1, S2 normal, no murmur, click, rub or gallop Abdomen: soft, non-tender; bowel sounds normal; no masses,  no organomegaly Pelvic: deferred Extremities: extremities normal, atraumatic, no cyanosis or edema Pulses: 2+ and symmetric Skin: Skin color, texture, turgor normal. No rashes or lesions Lymph nodes: Cervical, supraclavicular, and axillary nodes normal. Neurologic: Alert and oriented X 3, normal strength and tone. Normal symmetric reflexes. Normal coordination and gait    Assessment:    Healthy female exam.      Plan:     ghm utd Check labs  See After Visit Summary for Counseling Recommendations    1. Depression, major,  single episode, mild (HCC) Stable con't meds  - buPROPion (WELLBUTRIN XL) 300 MG 24 hr tablet; Take 1 tablet (300 mg total) by mouth daily.  Dispense: 90 tablet; Refill: 3  2. DM (diabetes mellitus) type II uncontrolled, periph vascular disorder (HCC) Check labs and refill meds [id number] to be checked , minimize simple carbs. Increase exercise as tolerated. Continue current meds  - sitaGLIPtin (JANUVIA) 100 MG tablet; Take 1 tablet (100 mg total) by mouth daily.  Dispense: 90 tablet; Refill: 3 - Lipid panel; Future - Hemoglobin A1c; Future - CBC with Differential/Platelet; Future - TSH; Future - Comprehensive metabolic panel; Future - Microalbumin / creatinine urine ratio; Future - blood glucose meter kit and supplies KIT; Dispense based on patient and insurance preference. Use up to four times daily as directed.  Dispense: 1 each; Refill: 0  3. Need for shingles vaccine   - Varicella-zoster vaccine IM  4. Need for hepatitis C screening test   - Hepatitis C antibody; Future  5. Preventative health care See above  - Lipid panel; Future - Hemoglobin A1c; Future - CBC with Differential/Platelet; Future - TSH; Future - Comprehensive metabolic panel; Future - Microalbumin / creatinine urine ratio; Future  6. Morbid obesity ([place])   - Amb Ref to Medical Weight Management  7. Need for pneumococcal vaccination   - Pneumococcal polysaccharide vaccine 23-valent greater than or equal  to 2yo subcutaneous/IM  8. Suspicious nevus   - Ambulatory referral to Dermatology

## 2020-04-03 ENCOUNTER — Other Ambulatory Visit: Payer: Self-pay | Admitting: Family Medicine

## 2020-05-12 ENCOUNTER — Other Ambulatory Visit: Payer: Self-pay | Admitting: Family Medicine

## 2020-09-16 ENCOUNTER — Encounter: Payer: Self-pay | Admitting: Family Medicine

## 2020-09-16 ENCOUNTER — Other Ambulatory Visit: Payer: Self-pay

## 2020-09-16 ENCOUNTER — Ambulatory Visit (HOSPITAL_BASED_OUTPATIENT_CLINIC_OR_DEPARTMENT_OTHER)
Admission: RE | Admit: 2020-09-16 | Discharge: 2020-09-16 | Disposition: A | Discharge status: Home / Self Care | Payer: Medicare Other | Source: Ambulatory Visit | Attending: Family Medicine | Admitting: Family Medicine

## 2020-09-16 ENCOUNTER — Ambulatory Visit (INDEPENDENT_AMBULATORY_CARE_PROVIDER_SITE_OTHER): Payer: Medicare Other | Admitting: Family Medicine

## 2020-09-16 VITALS — BP 142/98 | HR 77 | Temp 98.3°F | Resp 18 | Ht 69.0 in | Wt 229.4 lb

## 2020-09-16 DIAGNOSIS — M25552 Pain in left hip: Secondary | ICD-10-CM

## 2020-09-16 DIAGNOSIS — R8290 Unspecified abnormal findings in urine: Secondary | ICD-10-CM | POA: Diagnosis not present

## 2020-09-16 DIAGNOSIS — M25551 Pain in right hip: Secondary | ICD-10-CM | POA: Diagnosis not present

## 2020-09-16 DIAGNOSIS — R1031 Right lower quadrant pain: Secondary | ICD-10-CM | POA: Diagnosis present

## 2020-09-16 LAB — COMPREHENSIVE METABOLIC PANEL
ALT: 61 U/L — ABNORMAL HIGH (ref 0–35)
AST: 46 U/L — ABNORMAL HIGH (ref 0–37)
Albumin: 4.2 g/dL (ref 3.5–5.2)
Alkaline Phosphatase: 77 U/L (ref 39–117)
BUN: 12 mg/dL (ref 6–23)
CO2: 31 mEq/L (ref 19–32)
Calcium: 9.8 mg/dL (ref 8.4–10.5)
Chloride: 103 mEq/L (ref 96–112)
Creatinine, Ser: 0.92 mg/dL (ref 0.40–1.20)
GFR: 65.53 mL/min (ref >60.00)
Glucose, Bld: 149 mg/dL — ABNORMAL HIGH (ref 70–99)
Potassium: 4.3 mEq/L (ref 3.5–5.1)
Sodium: 141 mEq/L (ref 135–145)
Total Bilirubin: 0.4 mg/dL (ref 0.2–1.2)
Total Protein: 6.8 g/dL (ref 6.0–8.3)

## 2020-09-16 LAB — CBC WITH DIFFERENTIAL/PLATELET
Basophils Absolute: 0 10*3/uL (ref 0.0–0.1)
Basophils Relative: 0.5 % (ref 0.0–3.0)
Eosinophils Absolute: 0.1 10*3/uL (ref 0.0–0.7)
Eosinophils Relative: 1.7 % (ref 0.0–5.0)
HCT: 41.7 % (ref 36.0–46.0)
Hemoglobin: 13.7 g/dL (ref 12.0–15.0)
Lymphocytes Relative: 46.6 % — ABNORMAL HIGH (ref 12.0–46.0)
Lymphs Abs: 3.9 10*3/uL (ref 0.7–4.0)
MCHC: 32.9 g/dL (ref 30.0–36.0)
MCV: 93.7 fl (ref 78.0–100.0)
Monocytes Absolute: 0.5 10*3/uL (ref 0.1–1.0)
Monocytes Relative: 6.3 % (ref 3.0–12.0)
Neutro Abs: 3.8 10*3/uL (ref 1.4–7.7)
Neutrophils Relative %: 44.9 % (ref 43.0–77.0)
Platelets: 247 10*3/uL (ref 150.0–400.0)
RBC: 4.45 Mil/uL (ref 3.87–5.11)
RDW: 13.2 % (ref 11.5–15.5)
WBC: 8.4 10*3/uL (ref 4.0–10.5)

## 2020-09-16 LAB — POC URINALSYSI DIPSTICK (AUTOMATED)
Bilirubin, UA: NEGATIVE
Blood, UA: NEGATIVE
Glucose, UA: NEGATIVE
Ketones, UA: NEGATIVE
Nitrite, UA: NEGATIVE
Protein, UA: NEGATIVE
Spec Grav, UA: >=1.03 — AB (ref 1.010–1.025)
Urobilinogen, UA: 0.2 E.U./dL
pH, UA: 5 (ref 5.0–8.0)

## 2020-09-16 NOTE — Assessment & Plan Note (Signed)
Pain is not severe Check labs Check pelvic US If pain worsens f/u in office or go to ER

## 2020-09-16 NOTE — Assessment & Plan Note (Signed)
Hip xray IB prn

## 2020-09-16 NOTE — Patient Instructions (Addendum)
Pelvic Pain, Female Pelvic pain is pain in your lower abdomen, below your belly button and between your hips. The pain may start suddenly (be acute), keep coming back (be recurring), or last a long time (become chronic). Pelvic pain that lasts longer than 6 months is considered chronic. Pelvic pain may affect your: Reproductive organs. Urinary system. Digestive tract. Musculoskeletal system. There are many potential causes of pelvic pain. Sometimes, the pain can be a result of digestive or urinary conditions, muscles or ligaments, or reproductive conditions. Sometimes the cause of pelvic pain is not known. Follow these instructions at home:  Take over-the-counter and prescription medicines only as told by your health care provider. Rest as told by your health care provider. Do not have sex if it hurts. Keep a journal of your pelvic pain. Write down: When the pain started. Where the pain is located. What seems to make the pain better or worse, such as food or your period (menstrual cycle).+ ++Any symptoms you have along with the pain. Keep all follow-up visits as told by your health care provider. This is important. Contact a health care provider if: Medicine does not help your pain. Your pain comes back. You have new symptoms. You have abnormal vaginal discharge or bleeding, including bleeding after menopause. You have a fever or chills. You are constipated. You have blood in your urine or stool. You have foul-smelling urine. You feel weak or light-headed. Get help right away if: You have sudden severe pain. Your pain gets steadily worse. You have severe pain along with fever, nausea, vomiting, or excessive sweating. You lose consciousness. Summary Pelvic pain is pain in your lower abdomen, below your belly button and between your hips. There are many potential causes of pelvic pain. Keep a journal of your pelvic pain. This information is not intended to replace advice given to  you by your health care provider. Make sure you discuss any questions you have with your health care provider. Document Revised: 06/06/2017 Document Reviewed: 06/06/2017 Elsevier Patient Education  [employer].

## 2020-09-16 NOTE — Progress Notes (Signed)
Subjective:   By signing my name below, I, Shehryar Baig, attest that this documentation has been prepared under the direction and in the presence of Dr. Roma Schanz, DO. 09/16/2020     Patient ID: Vickie Nichols, female    DOB: February 22, 1955, 65 y.o.   MRN: [id number]  Chief Complaint  Patient presents with   Abdominal Pain    X1 month, right side, Pt states no other sxs. No urinary sxs    HPI Patient is in today for a office visit. She complains of constant right lower abdomen while sitting down and walking up steps for the past month. The pain has not worsened since it started. She has no pain with palpation to the area. She has no prior injury to or near the area. She denies having any fevers, N/V/D, frequency, dysuria at this time. She does not see a GYN specialist at this time.  She also complains bilateral hip pain while laying down for the past year. She is not seeing a provider to manage her pain.  She is not interested in getting the flu vaccine at this time.    Past Medical History:  Diagnosis Date   Anxiety    Depression    Diabetes mellitus without complication ([place])    Postmenopausal     Past Surgical History:  Procedure Laterality Date   CESAREAN SECTION     COLONOSCOPY  10/22/2019   2015   TONSILLECTOMY     TUBAL LIGATION      Family History  Problem Relation Age of Onset   Lung disease Father        fungus-Aspergillosis   Cancer Maternal Aunt        colon   Colon cancer Maternal Aunt    Esophageal cancer Maternal Aunt    Coronary artery disease Other 61       female   Colon cancer Other    Hypertension Paternal Grandfather    Diabetes Paternal Grandfather    Stroke Paternal Grandfather    Cancer Maternal Grandfather    Cancer Maternal Grandmother    Obesity Brother    Rectal cancer Neg Hx    Stomach cancer Neg Hx     Social History   Socioeconomic History   Marital status: Widowed    Spouse name: Not on file   Number of  children: Not on file   Years of education: Not on file   Highest education level: Not on file  Occupational History   Not on file  Tobacco Use   Smoking status: Former    Types: Cigarettes    Quit date: 02/03/2012    Years since quitting: 8.6   Smokeless tobacco: Never   Tobacco comments:    uses e cigarettes  Vaping Use   Vaping Use: Every day  Substance and Sexual Activity   Alcohol use: Yes    Comment: rare   Drug use: No   Sexual activity: Not Currently  Other Topics Concern   Not on file  Social History Narrative   Not on file   Social Determinants of Health   Financial Resource Strain: Not on file  Food Insecurity: Not on file  Transportation Needs: Not on file  Physical Activity: Not on file  Stress: Not on file  Social Connections: Not on file  Intimate Partner Violence: Not on file    Outpatient Medications Prior to Visit  Medication Sig Dispense Refill   blood glucose meter kit and supplies KIT Dispense  based on patient and insurance preference. Use up to four times daily as directed. 1 each 0   buPROPion (WELLBUTRIN XL) 300 MG 24 hr tablet Take 1 tablet (300 mg total) by mouth daily. 90 tablet 3   FREESTYLE LITE test strip USE AS DIRECTED UP TO 4 TIMES A DAY 100 strip 3   Lancets (FREESTYLE) lancets USE AS DIRECTED UP TO 4 TIMES A DAY 100 each 2   sitaGLIPtin (JANUVIA) 100 MG tablet Take 1 tablet (100 mg total) by mouth daily. 90 tablet 3   No facility-administered medications prior to visit.    No Known Allergies  Review of Systems  Constitutional:  Negative for fever and malaise/fatigue.  HENT:  Negative for congestion.   Eyes:  Negative for blurred vision.  Respiratory:  Negative for cough and shortness of breath.   Cardiovascular:  Negative for chest pain, palpitations and leg swelling.  Gastrointestinal:  Positive for abdominal pain (Right lower abdomen while sitting and walking). Negative for diarrhea, nausea and vomiting.  Genitourinary:   Negative for dysuria and frequency.  Musculoskeletal:  Positive for joint pain (Bilateral hip while laying down). Negative for back pain.  Skin:  Negative for rash.  Neurological:  Negative for loss of consciousness and headaches.      Objective:    Physical Exam Vitals and nursing note reviewed.  Constitutional:      General: She is not in acute distress.    Appearance: Normal appearance. She is well-developed. She is not ill-appearing.  HENT:     Head: Normocephalic and atraumatic.     Right Ear: External ear normal.     Left Ear: External ear normal.  Eyes:     Extraocular Movements: Extraocular movements intact.     Conjunctiva/sclera: Conjunctivae normal.     Pupils: Pupils are equal, round, and reactive to light.  Neck:     Thyroid: No thyromegaly.     Vascular: No carotid bruit or JVD.  Cardiovascular:     Rate and Rhythm: Normal rate and regular rhythm.     Pulses: Normal pulses.     Heart sounds: Normal heart sounds. No murmur heard.   No gallop.  Pulmonary:     Effort: Pulmonary effort is normal. No respiratory distress.     Breath sounds: Normal breath sounds. No wheezing or rales.  Chest:     Chest wall: No tenderness.  Abdominal:     General: Bowel sounds are normal. There is no distension.     Palpations: Abdomen is soft.     Tenderness: There is abdominal tenderness (Mild) in the right lower quadrant. There is no right CVA tenderness, left CVA tenderness, guarding or rebound. Negative signs include Murphy's sign and McBurney's sign.  Musculoskeletal:     Cervical back: Normal range of motion and neck supple.     Comments: Pain with right hip with movement.   Skin:    General: Skin is warm and dry.  Neurological:     Mental Status: She is alert and oriented to person, place, and time.  Psychiatric:        Behavior: Behavior normal.        Judgment: Judgment normal.    BP (!) 142/98 (BP Location: Right Arm, Patient Position: Sitting, Cuff Size: Normal)    Pulse 77   Temp 98.3 F (36.8 C) (Oral)   Resp 18   Ht $R'5\' 9"'tv$  (1.753 m)   Wt 229 lb 6.4 oz (104.1 kg)   SpO2 97%  BMI 33.88 kg/m  Wt Readings from Last 3 Encounters:  09/16/20 229 lb 6.4 oz (104.1 kg)  03/01/20 231 lb 9.6 oz (105.1 kg)  10/22/19 230 lb (104.3 kg)    Diabetic Foot Exam - Simple   No data filed    Lab Results  Component Value Date   WBC 13.7 (H) 12/17/2017   HGB 12.7 12/17/2017   HCT 39.5 12/17/2017   PLT 305 12/17/2017   GLUCOSE 125 (H) 06/21/2018   CHOL 171 06/21/2018   TRIG 126.0 06/21/2018   HDL 39.90 06/21/2018   LDLDIRECT 169.3 06/28/2012   LDLCALC 106 (H) 06/21/2018   ALT 13 06/21/2018   AST 12 06/21/2018   NA 141 06/21/2018   K 4.6 06/21/2018   CL 105 06/21/2018   CREATININE 0.87 06/21/2018   BUN 11 06/21/2018   CO2 28 06/21/2018   TSH 0.94 11/05/2015   HGBA1C 6.5 06/21/2018   MICROALBUR 0.7 06/21/2018    Lab Results  Component Value Date   TSH 0.94 11/05/2015   Lab Results  Component Value Date   WBC 13.7 (H) 12/17/2017   HGB 12.7 12/17/2017   HCT 39.5 12/17/2017   MCV 96.8 12/17/2017   PLT 305 12/17/2017   Lab Results  Component Value Date   NA 141 06/21/2018   K 4.6 06/21/2018   CO2 28 06/21/2018   GLUCOSE 125 (H) 06/21/2018   BUN 11 06/21/2018   CREATININE 0.87 06/21/2018   BILITOT 0.4 06/21/2018   ALKPHOS 75 06/21/2018   AST 12 06/21/2018   ALT 13 06/21/2018   PROT 6.3 06/21/2018   ALBUMIN 4.2 06/21/2018   CALCIUM 9.2 06/21/2018   ANIONGAP 9 12/17/2017   GFR 65.79 06/21/2018   Lab Results  Component Value Date   CHOL 171 06/21/2018   Lab Results  Component Value Date   HDL 39.90 06/21/2018   Lab Results  Component Value Date   LDLCALC 106 (H) 06/21/2018   Lab Results  Component Value Date   TRIG 126.0 06/21/2018   Lab Results  Component Value Date   CHOLHDL 4 06/21/2018   Lab Results  Component Value Date   HGBA1C 6.5 06/21/2018       Assessment & Plan:   Problem List Items Addressed  This Visit       Unprioritized   Abnormal urine   Relevant Orders   Urine Culture   Hip pain, bilateral    Hip xray IB prn       Right lower quadrant abdominal pain - Primary    Pain is not severe Check labs Check pelvic US If pain worsens f/u in office or go to ER      Relevant Orders   POCT Urinalysis Dipstick (Automated) (Completed)   DG HIPS BILAT W OR W/O PELVIS 2V   US Pelvic Complete With Transvaginal   CBC with Differential/Platelet   Comprehensive metabolic panel     No orders of the defined types were placed in this encounter.   I, Dr. Roma Schanz, DO, personally preformed the services described in this documentation.  All medical record entries made by the scribe were at my direction and in my presence.  I have reviewed the chart and discharge instructions (if applicable) and agree that the record reflects my personal performance and is accurate and complete. 09/16/2020   I,Shehryar Baig,acting as a scribe for Ann Held, DO.,have documented all relevant documentation on the behalf of Ann Held, DO,as directed by  Ann Held, DO while in the presence of Ann Held, DO.   Ann Held, DO

## 2020-09-17 ENCOUNTER — Other Ambulatory Visit: Payer: Self-pay | Admitting: Family Medicine

## 2020-09-17 ENCOUNTER — Encounter: Payer: Self-pay | Admitting: Family Medicine

## 2020-09-17 DIAGNOSIS — R748 Abnormal levels of other serum enzymes: Secondary | ICD-10-CM

## 2020-09-17 LAB — URINE CULTURE
MICRO NUMBER:: 12378948
Result:: NO GROWTH
SPECIMEN QUALITY:: ADEQUATE

## 2020-09-20 ENCOUNTER — Ambulatory Visit (HOSPITAL_BASED_OUTPATIENT_CLINIC_OR_DEPARTMENT_OTHER)
Admission: RE | Admit: 2020-09-20 | Discharge: 2020-09-20 | Disposition: A | Discharge status: Home / Self Care | Payer: Medicare Other | Source: Ambulatory Visit | Attending: Family Medicine | Admitting: Family Medicine

## 2020-09-20 ENCOUNTER — Other Ambulatory Visit: Payer: Self-pay | Admitting: Family Medicine

## 2020-09-20 ENCOUNTER — Encounter: Payer: Self-pay | Admitting: Family Medicine

## 2020-09-20 ENCOUNTER — Other Ambulatory Visit: Payer: Self-pay

## 2020-09-20 DIAGNOSIS — R1031 Right lower quadrant pain: Secondary | ICD-10-CM | POA: Insufficient documentation

## 2020-09-20 DIAGNOSIS — R9389 Abnormal findings on diagnostic imaging of other specified body structures: Secondary | ICD-10-CM

## 2020-09-21 ENCOUNTER — Encounter: Payer: Self-pay | Admitting: Family Medicine

## 2020-09-22 ENCOUNTER — Encounter: Payer: Self-pay | Admitting: Obstetrics and Gynecology

## 2020-09-22 ENCOUNTER — Other Ambulatory Visit: Payer: Self-pay

## 2020-09-22 ENCOUNTER — Encounter: Payer: Self-pay | Admitting: Family Medicine

## 2020-09-22 ENCOUNTER — Ambulatory Visit (INDEPENDENT_AMBULATORY_CARE_PROVIDER_SITE_OTHER): Payer: Medicare Other | Admitting: Obstetrics and Gynecology

## 2020-09-22 ENCOUNTER — Other Ambulatory Visit (HOSPITAL_COMMUNITY)
Admission: RE | Admit: 2020-09-22 | Discharge: 2020-09-22 | Disposition: A | Discharge status: Home / Self Care | Payer: Medicare Other | Source: Ambulatory Visit | Attending: Obstetrics and Gynecology | Admitting: Obstetrics and Gynecology

## 2020-09-22 DIAGNOSIS — R9389 Abnormal findings on diagnostic imaging of other specified body structures: Secondary | ICD-10-CM

## 2020-09-22 NOTE — Progress Notes (Signed)
Patient is here to discuss her endometrial thickening.  Last Pap- 10/29/2018 Mammogram- 09/04/2019

## 2020-09-22 NOTE — Addendum Note (Signed)
Addended by: Chancy Milroy on: 09/22/2020 04:58 PM   Modules accepted: Orders

## 2020-09-22 NOTE — Addendum Note (Signed)
Addended by: Phillip Heal, Ishmel Acevedo A on: 09/22/2020 04:58 PM   Modules accepted: Orders

## 2020-09-22 NOTE — Patient Instructions (Signed)
ENDOMETRIAL BIOPSY POST-PROCEDURE INSTRUCTIONS  You may take Ibuprofen, Aleve or Tylenol for pain if needed.  Cramping should resolve within in 24 hours.  You may have a small amount of spotting.  You should wear a mini pad for the next few days.  You may have intercourse after 24 hours.  You need to call if you have any pelvic pain, fever, heavy bleeding or foul smelling vaginal discharge.  Shower or bathe as normal  6. We will call you within one week with results or we will discuss   the results at your follow-up appointment if needed.  Endometrial Biopsy An endometrial biopsy is a procedure to remove tissue samples from the endometrium, which is the lining of the uterus. The tissue that is removed can then be checked under a microscope for disease. This procedure is used to diagnose conditions such as endometrial cancer, endometrial tuberculosis, polyps, or other inflammatory conditions. This procedure may also be used to investigate uterine bleeding to determine where you are in your menstrual cycle or how your hormone levels are affecting the lining of the uterus. Tell a health care provider about: Any allergies you have. All medicines you are taking, including vitamins, herbs, eye drops, creams, and over-the-counter medicines. Any problems you or family members have had with anesthetic medicines. Any blood disorders you have. Any surgeries you have had. Any medical conditions you have. Whether you are pregnant or may be pregnant. What are the risks? Generally, this is a safe procedure. However, problems may occur, including: Bleeding. Pelvic infection. Puncture of the wall of the uterus with the biopsy device (rare). Allergic reactions to medicines. What happens before the procedure? Keep a record of your menstrual cycles as told by your health care provider. You may need to schedule your procedure for a specific time in your cycle. You may want to bring a sanitary pad to wear  after the procedure. Plan to have someone take you home from the hospital or clinic. Ask your health care provider about: Changing or stopping your regular medicines. This is especially important if you are taking diabetes medicines, arthritis medicines, or blood thinners. Taking medicines such as aspirin and ibuprofen. These medicines can thin your blood. Do not take these medicines unless your health care provider tells you to take them. Taking over-the-counter medicines, vitamins, herbs, and supplements. What happens during the procedure? You will lie on an exam table with your feet and legs supported as in a pelvic exam. Your health care provider will insert an instrument (speculum) into your vagina to see your cervix. Your cervix will be cleansed with an antiseptic solution. A medicine (local anesthetic) will be used to numb the cervix. A forceps instrument (tenaculum) will be used to hold your cervix steady for the biopsy. A thin, rod-like instrument (uterine sound) will be inserted through your cervix to determine the length of your uterus and the location where the biopsy sample will be removed. A thin, flexible tube (catheter) will be inserted through your cervix and into the uterus. The catheter will be used to collect the biopsy sample from your endometrial tissue. The catheter and speculum will then be removed, and the tissue sample will be sent to a lab for examination. The procedure may vary among health care providers and hospitals. What can I expect after procedure? You will rest in a recovery area until you are ready to go home. You may have mild cramping and a small amount of vaginal bleeding. This is normal. You  may have a small amount of vaginal bleeding for a few days. This is normal. It is up to you to get the results of your procedure. Ask your health care provider, or the department that is doing the procedure, when your results will be ready. Follow these instructions at  home: Take over-the-counter and prescription medicines only as told by your health care provider. Do not douche, use tampons, or have sexual intercourse until your health care provider approves. Return to your normal activities as told by your health care provider. Ask your health care provider what activities are safe for you. Follow instructions from your health care provider about any activity restrictions, such as restrictions on strenuous exercise or heavy lifting. Keep all follow-up visits. This is important. Contact a health care provider: You have heavy bleeding, or bleed for longer than 2 days after the procedure. You have bad smelling discharge from your vagina. You have a fever or chills. You have a burning sensation when urinating or you have difficulty urinating. You have severe pain in your lower abdomen. Get help right away if you: You have severe cramps in your stomach or back. You pass large blood clots. Your bleeding increases. You become weak or light-headed, or you faint or lose consciousness. Summary An endometrial biopsy is a procedure to remove tissue samples is taken from the endometrium, which is the lining of the uterus. The tissue sample that is removed will be checked under a microscope for disease. This procedure is used to diagnose conditions such as endometrial cancer, endometrial tuberculosis, polyps, or other inflammatory conditions. After the procedure, it is common to have mild cramping and a small amount of vaginal bleeding for a few days. Do not douche, use tampons, or have sexual intercourse until your health care provider approves. Ask your health care provider which activities are safe for you. This information is not intended to replace advice given to you by your health care provider. Make sure you discuss any questions you have with your health care provider. Document Revised: 07/14/2019 Document Reviewed: 07/14/2019 Elsevier Patient Education   [place].

## 2020-09-22 NOTE — Telephone Encounter (Deleted)
I have printed the forms. We can review when your in the office.

## 2020-09-22 NOTE — Progress Notes (Signed)
  Vickie Nichols present for eval of thicken endometrium noted on U/S 09/20/20. LMP 15 yrs ago. No PMB.  Not sexual active.  UTD on pap smear and mammogram  U/S findings reviewed with pt EMBX recommended and reviewed with pt Pt agrees to proceed.       ENDOMETRIAL BIOPSY    Chaperone present during procedure    The indications for endometrial biopsy were reviewed.   Risks of the biopsy including cramping, bleeding, infection, uterine perforation, inadequate specimen and need for additional procedures  were discussed. The patient states she understands and agrees to undergo procedure today. Consent was signed. Time out was performed. During the pelvic exam, the cervix was prepped with Betadine. A single-toothed tenaculum was placed on the anterior lip of the cervix to stabilize it. The 3 mm pipelle was introduced into the endometrial cavity without difficulty to a depth of 7 cm, and a moderate amount of tissue was obtained and sent to pathology. The instruments were removed from the patient's vagina. Minimal bleeding from the cervix was noted. The patient tolerated the procedure well. Routine post-procedure instructions were given to the patient.      A/P Thicken endometrium on U/S  S/P EMBX. Pt will be contacted with Bx and follow as per results.

## 2020-09-23 ENCOUNTER — Other Ambulatory Visit: Payer: Self-pay | Admitting: Family Medicine

## 2020-09-23 DIAGNOSIS — Z1231 Encounter for screening mammogram for malignant neoplasm of breast: Secondary | ICD-10-CM

## 2020-09-24 LAB — SURGICAL PATHOLOGY

## 2020-09-28 DIAGNOSIS — Z0289 Encounter for other administrative examinations: Secondary | ICD-10-CM

## 2020-10-01 ENCOUNTER — Encounter: Payer: Self-pay | Admitting: Family Medicine

## 2020-10-01 ENCOUNTER — Other Ambulatory Visit: Payer: Self-pay

## 2020-10-01 ENCOUNTER — Other Ambulatory Visit (INDEPENDENT_AMBULATORY_CARE_PROVIDER_SITE_OTHER): Payer: Medicare Other

## 2020-10-01 DIAGNOSIS — Z0000 Encounter for general adult medical examination without abnormal findings: Secondary | ICD-10-CM

## 2020-10-01 DIAGNOSIS — E1165 Type 2 diabetes mellitus with hyperglycemia: Secondary | ICD-10-CM | POA: Diagnosis not present

## 2020-10-01 DIAGNOSIS — E1151 Type 2 diabetes mellitus with diabetic peripheral angiopathy without gangrene: Secondary | ICD-10-CM | POA: Diagnosis not present

## 2020-10-01 DIAGNOSIS — R748 Abnormal levels of other serum enzymes: Secondary | ICD-10-CM

## 2020-10-01 DIAGNOSIS — IMO0002 Reserved for concepts with insufficient information to code with codable children: Secondary | ICD-10-CM

## 2020-10-01 LAB — COMPREHENSIVE METABOLIC PANEL
ALT: 52 U/L — ABNORMAL HIGH (ref 0–35)
AST: 40 U/L — ABNORMAL HIGH (ref 0–37)
Albumin: 4.2 g/dL (ref 3.5–5.2)
Alkaline Phosphatase: 68 U/L (ref 39–117)
BUN: 12 mg/dL (ref 6–23)
CO2: 30 mEq/L (ref 19–32)
Calcium: 9.2 mg/dL (ref 8.4–10.5)
Chloride: 104 mEq/L (ref 96–112)
Creatinine, Ser: 0.94 mg/dL (ref 0.40–1.20)
GFR: 63.85 mL/min (ref >60.00)
Glucose, Bld: 132 mg/dL — ABNORMAL HIGH (ref 70–99)
Potassium: 3.8 mEq/L (ref 3.5–5.1)
Sodium: 141 mEq/L (ref 135–145)
Total Bilirubin: 0.5 mg/dL (ref 0.2–1.2)
Total Protein: 6.7 g/dL (ref 6.0–8.3)

## 2020-10-01 LAB — MICROALBUMIN / CREATININE URINE RATIO
Creatinine,U: 202.9 mg/dL
Microalb Creat Ratio: 1 mg/g (ref 0.0–30.0)
Microalb, Ur: 2.1 mg/dL — ABNORMAL HIGH (ref 0.0–1.9)

## 2020-10-01 LAB — HEMOGLOBIN A1C: Hgb A1c MFr Bld: 7.1 % — ABNORMAL HIGH (ref 4.6–6.5)

## 2020-10-05 ENCOUNTER — Other Ambulatory Visit: Payer: Self-pay

## 2020-10-05 DIAGNOSIS — R7989 Other specified abnormal findings of blood chemistry: Secondary | ICD-10-CM

## 2020-10-06 NOTE — Telephone Encounter (Signed)
See lab results.  

## 2020-10-18 ENCOUNTER — Other Ambulatory Visit: Payer: Self-pay

## 2020-10-18 ENCOUNTER — Ambulatory Visit
Admission: RE | Admit: 2020-10-18 | Discharge: 2020-10-18 | Disposition: A | Discharge status: Home / Self Care | Payer: Medicare Other | Source: Ambulatory Visit

## 2020-10-18 DIAGNOSIS — Z1231 Encounter for screening mammogram for malignant neoplasm of breast: Secondary | ICD-10-CM

## 2020-10-19 ENCOUNTER — Ambulatory Visit (HOSPITAL_BASED_OUTPATIENT_CLINIC_OR_DEPARTMENT_OTHER)
Admission: RE | Admit: 2020-10-19 | Discharge: 2020-10-19 | Disposition: A | Discharge status: Home / Self Care | Payer: Medicare Other | Source: Ambulatory Visit | Attending: Family Medicine | Admitting: Family Medicine

## 2020-10-19 DIAGNOSIS — R7989 Other specified abnormal findings of blood chemistry: Secondary | ICD-10-CM | POA: Diagnosis not present

## 2020-10-20 ENCOUNTER — Ambulatory Visit: Payer: Medicare Other | Admitting: Obstetrics and Gynecology

## 2020-10-28 ENCOUNTER — Other Ambulatory Visit (INDEPENDENT_AMBULATORY_CARE_PROVIDER_SITE_OTHER): Payer: Self-pay | Admitting: Family Medicine

## 2020-10-28 ENCOUNTER — Encounter (INDEPENDENT_AMBULATORY_CARE_PROVIDER_SITE_OTHER): Payer: Self-pay | Admitting: Family Medicine

## 2020-10-28 ENCOUNTER — Other Ambulatory Visit: Payer: Self-pay

## 2020-10-28 ENCOUNTER — Ambulatory Visit (INDEPENDENT_AMBULATORY_CARE_PROVIDER_SITE_OTHER): Payer: Medicare Other | Admitting: Family Medicine

## 2020-10-28 VITALS — BP 170/80 | HR 67 | Ht 68.0 in | Wt 219.0 lb

## 2020-10-28 DIAGNOSIS — Z6833 Body mass index (BMI) 33.0-33.9, adult: Secondary | ICD-10-CM

## 2020-10-28 DIAGNOSIS — Z1331 Encounter for screening for depression: Secondary | ICD-10-CM | POA: Diagnosis not present

## 2020-10-28 DIAGNOSIS — E119 Type 2 diabetes mellitus without complications: Secondary | ICD-10-CM | POA: Insufficient documentation

## 2020-10-28 DIAGNOSIS — E1169 Type 2 diabetes mellitus with other specified complication: Secondary | ICD-10-CM | POA: Diagnosis not present

## 2020-10-28 DIAGNOSIS — K760 Fatty (change of) liver, not elsewhere classified: Secondary | ICD-10-CM | POA: Insufficient documentation

## 2020-10-28 DIAGNOSIS — E669 Obesity, unspecified: Secondary | ICD-10-CM | POA: Diagnosis not present

## 2020-10-28 DIAGNOSIS — R030 Elevated blood-pressure reading, without diagnosis of hypertension: Secondary | ICD-10-CM | POA: Diagnosis not present

## 2020-10-28 DIAGNOSIS — R5383 Other fatigue: Secondary | ICD-10-CM | POA: Diagnosis not present

## 2020-10-28 DIAGNOSIS — F39 Unspecified mood [affective] disorder: Secondary | ICD-10-CM

## 2020-10-28 DIAGNOSIS — R0602 Shortness of breath: Secondary | ICD-10-CM

## 2020-10-29 LAB — TSH: TSH: 1.4 u[IU]/mL (ref 0.450–4.500)

## 2020-10-29 LAB — LIPID PANEL WITH LDL/HDL RATIO
Cholesterol, Total: 197 mg/dL (ref 100–199)
HDL: 57 mg/dL (ref >39)
LDL Chol Calc (NIH): 120 mg/dL — ABNORMAL HIGH (ref 0–99)
LDL/HDL Ratio: 2.1 ratio (ref 0.0–3.2)
Triglycerides: 112 mg/dL (ref 0–149)
VLDL Cholesterol Cal: 20 mg/dL (ref 5–40)

## 2020-10-29 LAB — VITAMIN D 25 HYDROXY (VIT D DEFICIENCY, FRACTURES): Vit D, 25-Hydroxy: 23.3 ng/mL — ABNORMAL LOW (ref 30.0–100.0)

## 2020-10-29 LAB — INSULIN, RANDOM: INSULIN: 19.5 u[IU]/mL (ref 2.6–24.9)

## 2020-10-29 LAB — T4, FREE: Free T4: 0.99 ng/dL (ref 0.82–1.77)

## 2020-11-01 NOTE — Progress Notes (Signed)
Dear Vickie Nichols,   Thank you for referring Vickie Nichols to our clinic. The following note includes my evaluation and treatment recommendations.  Chief Complaint:   Vickie Nichols (MR# [id number]) is a 65 y.o. female who presents for evaluation and treatment of Vickie and related comorbidities. Current BMI is Body mass index is 33.3 kg/m. Vickie Nichols has been struggling with her weight for many years and has been unsuccessful in either losing weight, maintaining weight loss, or reaching her healthy weight goal.  Vickie Nichols is currently in the action stage of change and ready to dedicate time achieving and maintaining a healthier weight. Vickie Nichols is interested in becoming our patient and working on intensive lifestyle modifications including (but not limited to) diet and exercise for weight loss.  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 She is widowed and lives alone. Pt drinks a lot os caloric beverages. She craves BBQ, cakes, bread, chips/salsa, and gummies. She skips breakfast most days. Pt used to go to the gym daily and walked 3 miles a day and did [employer].   Vickie Nichols's habits were reviewed today and are as follows: her desired weight loss is 39 lbs, she started gaining weight a couple of years ago, her heaviest weight ever was 230 pounds, she has significant food cravings issues, she skips meals frequently, she is frequently drinking liquids with calories, she frequently makes poor food choices, and she struggles with emotional eating.  Depression Screen Vickie Nichols's Food and Mood (modified PHQ-9) score was 5.  Depression screen PHQ 2/9 10/28/2020  Decreased Interest 2  Down, Depressed, Hopeless 1  PHQ - 2 Score 3  Altered sleeping 0  Tired, decreased energy 1  Change in appetite 1  Feeling bad or failure about yourself  0  Trouble concentrating 0  Moving slowly or fidgety/restless 0  Suicidal thoughts 0  PHQ-9 Score 5   Difficult doing work/chores Not difficult at all   Subjective:   1. Other fatigue Vickie Nichols admits to daytime somnolence and denies waking up still tired. Patent has a history of symptoms of daytime fatigue. Vickie Nichols generally gets 8 or 9 hours of sleep per night, and states that she has generally restful sleep. Snoring is present. Apneic episodes are not present. Epworth Sleepiness Score is 4. PHQ= WNL's  2. Shortness of breath on exertion Vickie Nichols notes increasing shortness of breath with exercising and seems to be worsening over time with weight gain. She notes getting out of breath sooner with activity than she used to. This has gotten worse recently. Vickie Nichols denies shortness of breath at rest or orthopnea.  3. Type 2 diabetes mellitus with other specified complication, without long-term current use of insulin (HCC) Not at goal. Pt was diagnosed 10 years ago. She has only been on Januvia. Her A1c was recently at 7.1 with PCP.  4. Elevated blood pressure reading Pt was never diagnosed with hypertension despite being diabetic and has never been on meds. Pt historically has not had routine office visits and was lost to follow up.  5. NAFLD (nonalcoholic fatty liver disease) Vickie Nichols has a new dx of elevated ALT. Her BMI is over 40. She denies abdominal pain or jaundice and has never been told of any liver problems in the past. She denies excessive alcohol intake.  6. 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  She is controlled now with Wellbutrin.  Assessment/Plan:   Orders Placed This Encounter  Procedures   Insulin, random   Lipid Panel With LDL/HDL Ratio   T4, free   TSH   VITAMIN D 25 Hydroxy (Vit-D Deficiency, Fractures)   EKG 12-Lead    Medications Discontinued During This Encounter  Medication Reason   Lancets (FREESTYLE) lancets Error   FREESTYLE LITE  test strip Error   blood glucose meter kit and supplies KIT Error     No orders of the defined types were placed in this encounter.    1. Other fatigue Vickie Nichols does feel that her weight is causing her energy to be lower than it should be. Fatigue may be related to Vickie, depression or many other causes. Labs will be ordered, and in the meanwhile, Vickie Nichols will focus on self care including making healthy food choices, increasing physical activity and focusing on stress reduction. Check labs today.  - EKG 12-Lead - T4, free - TSH - VITAMIN D 25 Hydroxy (Vit-D Deficiency, Fractures)  2. Shortness of breath on exertion Vickie Nichols does feel that she gets out of breath more easily that she used to when she exercises. Vickie Nichols's shortness of breath appears to be Vickie related and exercise induced. She has agreed to work on weight loss and gradually increase exercise to treat her exercise induced shortness of breath. Will continue to monitor closely.  3. Type 2 diabetes mellitus with other specified complication, without long-term current use of insulin ([place]) Check labs and will consider chang in medication in the future, but pt prefers to be "off all meds".  - Insulin, random - Lipid Panel With LDL/HDL Ratio  4. Elevated blood pressure reading Decrease salt intake, focus on prudent nutritional plan, weight loss, and we will monitor closely.  5. NAFLD (nonalcoholic fatty liver disease) We discussed the likely diagnosis of non-alcoholic fatty liver disease today and how this condition is Vickie related. Vickie Nichols was educated the importance of weight loss. Vickie Nichols agreed to continue with her weight loss efforts with healthier diet and exercise as an essential part of her treatment plan.  6. 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 We will consider Vickie Nichols referral as needed in the future. No indication of need currently.  7. Screening for  depression Vickie Nichols had a positive depression screening. Depression is commonly associated with Vickie and often results in emotional eating behaviors. We will monitor this closely and work on CBT to help improve the non-hunger eating patterns. Referral to Psychology may be required if no improvement is seen as she continues in our clinic.  8. Vickie with current BMI of 33.4  Vickie Nichols is currently in the action stage of change and her goal is to continue with weight loss efforts. I recommend Vickie Nichols begin the structured treatment plan as follows:  She has agreed to the Category 2 Plan.  Exercise goals:  As is    Behavioral modification strategies: increasing lean protein intake, decreasing simple carbohydrates, and planning for success.  She was informed of the importance of frequent follow-up visits to maximize her success with intensive lifestyle modifications for her multiple health conditions. She was informed we would discuss her lab results at her next visit unless there is a critical issue that needs to be addressed sooner. Vickie Nichols agreed to keep her next visit at the agreed upon time to discuss these results.  Objective:   Blood pressure (!) 170/80, pulse 67, height 5' 8"  (1.727  m), weight 219 lb (99.3 kg), SpO2 97 %. Body mass index is 33.3 kg/m.  EKG: Normal sinus rhythm, rate 67.  Indirect Calorimeter completed today shows a VO2 of 264 and a REE of 1814.  Her calculated basal metabolic rate is 1700 thus her basal metabolic rate is better than expected.  General: Cooperative, alert, well developed, in no acute distress. HEENT: Conjunctivae and lids unremarkable. Cardiovascular: Regular rhythm.  Lungs: Normal work of breathing. Neurologic: No focal deficits.   Lab Results  Component Value Date   CREATININE 0.94 10/01/2020   BUN 12 10/01/2020   NA 141 10/01/2020   K 3.8 10/01/2020   CL 104 10/01/2020   CO2 30 10/01/2020   Lab Results  Component Value Date   ALT 52  (H) 10/01/2020   AST 40 (H) 10/01/2020   ALKPHOS 68 10/01/2020   BILITOT 0.5 10/01/2020   Lab Results  Component Value Date   HGBA1C 7.1 (H) 10/01/2020   HGBA1C 6.5 06/21/2018   HGBA1C 6.0 08/24/2016   HGBA1C 7.0 (H) 11/08/2015   HGBA1C 7.2 (H) 11/18/2014   Lab Results  Component Value Date   INSULIN 19.5 10/28/2020   Lab Results  Component Value Date   TSH 1.400 10/28/2020   Lab Results  Component Value Date   CHOL 197 10/28/2020   HDL 57 10/28/2020   LDLCALC 120 (H) 10/28/2020   LDLDIRECT 169.3 06/28/2012   TRIG 112 10/28/2020   CHOLHDL 4 06/21/2018   Lab Results  Component Value Date   WBC 8.4 09/16/2020   HGB 13.7 09/16/2020   HCT 41.7 09/16/2020   MCV 93.7 09/16/2020   PLT 247.0 09/16/2020   No results found for: IRON, TIBC, FERRITIN Vickie Behavioral Intervention:   Approximately 15 minutes were spent on the discussion below.  ASK: We discussed the diagnosis of Vickie with Vickie Nichols today and Lashana agreed to give [place] permission to discuss Vickie behavioral modification therapy today.  ASSESS: Darchelle has the diagnosis of Vickie and her BMI today is 33.4. Lewanna is in the action stage of change.   ADVISE: Shabre was educated on the multiple health risks of Vickie as well as the benefit of weight loss to improve her health. She was advised of the need for long term treatment and the importance of lifestyle modifications to improve her current health and to decrease her risk of future health problems.  AGREE: Multiple dietary modification options and treatment options were discussed and Julane agreed to follow the recommendations documented in the above note.  ARRANGE: Alyzza was educated on the importance of frequent visits to treat Vickie as outlined per CMS and USPSTF guidelines and agreed to schedule her next follow up appointment today.  Attestation Statements:   Reviewed by clinician on day of visit: allergies, medications, problem  list, medical history, surgical history, family history, social history, and previous encounter notes.  Coral Ceo, CMA, am acting as transcriptionist for [employer], DO.  I have reviewed the above documentation for accuracy and completeness, and I agree with the above. Marjory Sneddon, D.O.  The [place] was signed into law in 2016 which includes the topic of electronic health records.  This provides immediate access to information in MyChart.  This includes consultation notes, operative notes, office notes, lab results and pathology reports.  If you have any questions about what you read please let us know at your next visit so we can discuss your concerns and take corrective action if  need be.  We are right here with you.

## 2020-11-11 ENCOUNTER — Encounter (INDEPENDENT_AMBULATORY_CARE_PROVIDER_SITE_OTHER): Payer: Self-pay | Admitting: Family Medicine

## 2020-11-11 ENCOUNTER — Other Ambulatory Visit: Payer: Self-pay

## 2020-11-11 ENCOUNTER — Ambulatory Visit (INDEPENDENT_AMBULATORY_CARE_PROVIDER_SITE_OTHER): Payer: Medicare Other | Admitting: Family Medicine

## 2020-11-11 VITALS — BP 118/61 | HR 76 | Temp 98.0°F | Ht 68.0 in | Wt 213.0 lb

## 2020-11-11 DIAGNOSIS — E559 Vitamin D deficiency, unspecified: Secondary | ICD-10-CM

## 2020-11-11 DIAGNOSIS — Z6833 Body mass index (BMI) 33.0-33.9, adult: Secondary | ICD-10-CM | POA: Diagnosis not present

## 2020-11-11 DIAGNOSIS — E669 Obesity, unspecified: Secondary | ICD-10-CM | POA: Diagnosis not present

## 2020-11-11 DIAGNOSIS — E785 Hyperlipidemia, unspecified: Secondary | ICD-10-CM

## 2020-11-11 DIAGNOSIS — R030 Elevated blood-pressure reading, without diagnosis of hypertension: Secondary | ICD-10-CM | POA: Diagnosis not present

## 2020-11-11 DIAGNOSIS — K760 Fatty (change of) liver, not elsewhere classified: Secondary | ICD-10-CM | POA: Diagnosis not present

## 2020-11-11 DIAGNOSIS — E1169 Type 2 diabetes mellitus with other specified complication: Secondary | ICD-10-CM | POA: Diagnosis not present

## 2020-11-11 MED ORDER — VITAMIN D (ERGOCALCIFEROL) 1.25 MG (50000 UNIT) PO CAPS: 50000.0000 [IU] | ORAL_CAPSULE | ORAL | 0 refills | Status: DC

## 2020-11-11 NOTE — Progress Notes (Signed)
Chief Complaint:   OBESITY Vickie Nichols is here to discuss her progress with her obesity treatment plan along with follow-up of her obesity related diagnoses. Vickie Nichols is on the Category 2 Plan and states Vickie Nichols is following her eating plan approximately 85-90% of the time. Vickie Nichols states Vickie Nichols is not currently exercising.  Today's visit was #: 2 Starting weight: 219 lbs Starting date: 10/28/2020 Today's weight: 213 lbs Today's date: 11/11/2020 Total lbs lost to date: 6 Total lbs lost since last in-office visit: 6  Interim History: Vickie Nichols is here today for her first follow-up office visit since starting the program with [place].  All blood work/ lab tests that were recently ordered by myself or an outside provider were reviewed with patient today per their request.   Extended time was spent counseling her on all new disease processes that were discovered or preexisting ones that are affected by BMI.  Vickie Nichols understands that many of these abnormalities will need to monitored regularly along with the current treatment plan of prudent dietary changes, in which we are making each and every office visit, to improve these health parameters.  Pt had no hunger but occasional cravings (not bad). Vickie Nichols reports it is difficult to eat all the meat on sandwiches etc.  Vickie Nichols is being more mindful with what Vickie Nichols is eating and looking at labels.  Pt feels less achy in her joints and muscles and loves that Vickie Nichols can feel the impact of eating healthier on her body.  No other concerns/ complaints.   Subjective:   1. Elevated blood pressure reading  Discussed labs with patient today.  Vickie Nichols's BP at her last OV was 170/80 at best.   Today it is at goal. Pt w/o sx or concerns today    2. NAFLD (nonalcoholic fatty liver disease)  Discussed labs with patient today.  Diagnosed ultrasound results on 10/19/2020 with pt.   CMP done on 10/01/20 that showed elevated ALT and AST, but down from prior.   3.  Hyperlipidemia associated with type 2 diabetes mellitus (HCC)  Worsening.   Discussed labs with patient today.  Not at goal.   Pt endorses Vickie Nichols has declined statins repeatedly in past with PCP.    4. Type 2 diabetes mellitus with other specified complication, without long-term current use of insulin ([place])  Discussed labs with patient today.  Labs done 10/01/2020 reviewed with pt. Vickie Nichols is on Januvia.   Pt does not like to take a lot of meds and wants to avoid them, if possible. A1c 7.1.   5. Vitamin D deficiency  New.   Discussed labs with patient today.  Pt had bone density scan "several" years ago that was within normal limits.    Assessment/Plan:   Meds ordered this encounter  Medications   Vitamin D, Ergocalciferol, (DRISDOL) 1.25 MG (50000 UNIT) CAPS capsule    Sig: Take 1 capsule (50,000 Units total) by mouth every 7 (seven) days.    Dispense:  4 capsule    Refill:  0    30 d supply;  ** OV for RF **   Do not send RF request     1. Elevated blood pressure reading  New onset of higher BP last OV.  Being diabetic, I discussed with pt that an ARB would be a good idea and I recommend Vickie Nichols discuss this with her PCP.    BP within normal limits today.   2. NAFLD (nonalcoholic fatty liver disease)  We discussed the likely  diagnosis of non-alcoholic fatty liver disease today and how this condition is obesity related.   Vickie Nichols was educated the importance of weight loss.   Vickie Nichols agreed to continue with her weight loss efforts with healthier diet and exercise as an essential part of her treatment plan.   Focus on prudent nutritional plan, weight loss, avoid fatty/high carb meals, and increase exercise.  Rechk labs 3-4 mo   3. Hyperlipidemia associated with type 2 diabetes mellitus (HCC)  - Condition worsening  -Chol panel - not at goal  The 10-year ASCVD risk score (Arnett DK, et al., 2019) is: 8.8%  Pt declines a statin medication today and  understands the risks.   -Vickie Nichols desires 3-4 months of prudent nutritional plan and recheck labs at that time. Vickie Nichols agrees to go on meds at that time, if labs are not at goal.   - Cardiovascular risk and specific lipid/LDL goals reviewed.  We discussed several lifestyle modifications today and Vickie Nichols will continue to work on diet, exercise and weight loss efforts. Orders and follow up as documented in patient record.   Counseling Intensive lifestyle modifications are the first line treatment for this issue. Dietary changes: Increase soluble fiber. Decrease simple carbohydrates. Exercise changes: Moderate to vigorous-intensity aerobic activity 150 minutes per week if tolerated. Lipid-lowering medications: see documented in medical record.    4. Type 2 diabetes mellitus with other specified complication, without long-term current use of insulin (HCC)  DM /  A1c not at goal.  Good blood sugar control is important to decrease the likelihood of diabetic complications such as nephropathy, neuropathy, limb loss, blindness, coronary artery disease, and death.   Intensive lifestyle modification including diet, exercise and weight loss are the first line of treatment for diabetes.   Will consider alternative diabetes medication to aid in weight loss in the future when pt hits plateau or prn.  Vickie Nichols wants to avoid additional meds at this time.    Mediation management- Continue Januvia, per PCP.    5. Vitamin D deficiency  - New onset    - vit d NOT at goal  Plan: - Discussed importance of vitamin D to their health and well-being.  - possible symptoms of low Vitamin D can be low energy, depressed mood, muscle aches, joint aches, osteoporosis etc. - low Vitamin D levels may be linked to an increased risk of cardiovascular events and even increased risk of cancers- such as colon and breast.  - I recommend pt take a 50,000 IU weekly prescription vit D - see script below   - Informed patient this  may be a lifelong thing, and Vickie Nichols was encouraged to continue to take the medicine until told otherwise.   - we will need to monitor levels regularly (every 3-4 mo on average) to keep levels within normal limits.  - weight loss will likely improve availability of vitamin D, thus encouraged Vickie Nichols to continue with meal plan and their weight loss efforts to further improve this condition - pt's questions and concerns regarding this condition addressed.  --> I highly rec Vickie Nichols obtain an updated bone density in near future and then q 2 yrs thereafter.  Start- Vitamin D, Ergocalciferol, (DRISDOL) 1.25 MG (50000 UNIT) CAPS capsule; Take 1 capsule (50,000 Units total) by mouth every 7 (seven) days.  Dispense: 4 capsule; Refill: 0    6. Obesity with current BMI of 32.4  Vickie Nichols is currently in the action stage of change. As such, her goal is to continue with weight loss  efforts. Vickie Nichols has agreed to Change to the Category 2 Plan with lunch options and protein equivalents.   Discussed with pt ways to make meals more interesting/exciting.  Lunch options d/c pt.  We also reviewed alternatives to eat instead of 2 oz meat in addition to review of meal plan.  Exercise goals:  As is  for now  Behavioral modification strategies: decreasing simple carbohydrates, increasing water intake, meal planning and cooking strategies, and better snacking choices.  Vickie Nichols has agreed to follow-up with our clinic in 2 weeks. Vickie Nichols was informed of the importance of frequent follow-up visits to maximize her success with intensive lifestyle modifications for her multiple health conditions.     Objective:   Blood pressure 118/61, pulse 76, temperature 98 F (36.7 C), height 5\' 8"  (1.727 m), weight 213 lb (96.6 kg), SpO2 97 %. Body mass index is 32.39 kg/m.  General: Cooperative, alert, well developed, in no acute distress. HEENT: Conjunctivae and lids unremarkable. Cardiovascular: Regular rhythm.  Lungs: Normal work  of breathing. Neurologic: No focal deficits.   Lab Results  Component Value Date   CREATININE 0.94 10/01/2020   BUN 12 10/01/2020   NA 141 10/01/2020   K 3.8 10/01/2020   CL 104 10/01/2020   CO2 30 10/01/2020   Lab Results  Component Value Date   ALT 52 (H) 10/01/2020   AST 40 (H) 10/01/2020   ALKPHOS 68 10/01/2020   BILITOT 0.5 10/01/2020   Lab Results  Component Value Date   HGBA1C 7.1 (H) 10/01/2020   HGBA1C 6.5 06/21/2018   HGBA1C 6.0 08/24/2016   HGBA1C 7.0 (H) 11/08/2015   HGBA1C 7.2 (H) 11/18/2014   Lab Results  Component Value Date   INSULIN 19.5 10/28/2020   Lab Results  Component Value Date   TSH 1.400 10/28/2020   Lab Results  Component Value Date   CHOL 197 10/28/2020   HDL 57 10/28/2020   LDLCALC 120 (H) 10/28/2020   LDLDIRECT 169.3 06/28/2012   TRIG 112 10/28/2020   CHOLHDL 4 06/21/2018   Lab Results  Component Value Date   VD25OH 23.3 (L) 10/28/2020   Lab Results  Component Value Date   WBC 8.4 09/16/2020   HGB 13.7 09/16/2020   HCT 41.7 09/16/2020   MCV 93.7 09/16/2020   PLT 247.0 09/16/2020     Obesity Behavioral Intervention:   Approximately 15 minutes were spent on the discussion below.  ASK: We discussed the diagnosis of obesity with Vickie Nichols today and Jarae agreed to give [place] permission to discuss obesity behavioral modification therapy today.  ASSESS: Yolander has the diagnosis of obesity and her BMI today is 32.4. Kmari is in the action stage of change.   ADVISE: Luv was educated on the multiple health risks of obesity as well as the benefit of weight loss to improve her health. Vickie Nichols was advised of the need for long term treatment and the importance of lifestyle modifications to improve her current health and to decrease her risk of future health problems.  AGREE: Multiple dietary modification options and treatment options were discussed and Vickie Nichols agreed to follow the recommendations documented in the above  note.  ARRANGE: Vickie Nichols was educated on the importance of frequent visits to treat obesity as outlined per CMS and USPSTF guidelines and agreed to schedule her next follow up appointment today.    Attestation Statements:   Reviewed by clinician on day of visit: allergies, medications, problem list, medical history, surgical history, family history, social history, and previous encounter  notes.  Coral Ceo, CMA, am acting as transcriptionist for [employer], DO.  I have reviewed the above documentation for accuracy and completeness, and I agree with the above. Marjory Sneddon, D.O.  The [place] was signed into law in 2016 which includes the topic of electronic health records.  This provides immediate access to information in MyChart.  This includes consultation notes, operative notes, office notes, lab results and pathology reports.  If you have any questions about what you read please let us know at your next visit so we can discuss your concerns and take corrective action if need be.  We are right here with you.

## 2020-11-30 ENCOUNTER — Encounter (INDEPENDENT_AMBULATORY_CARE_PROVIDER_SITE_OTHER): Payer: Self-pay | Admitting: Family Medicine

## 2020-11-30 ENCOUNTER — Ambulatory Visit (INDEPENDENT_AMBULATORY_CARE_PROVIDER_SITE_OTHER): Payer: Medicare Other | Admitting: Family Medicine

## 2020-11-30 ENCOUNTER — Other Ambulatory Visit: Payer: Self-pay

## 2020-11-30 VITALS — BP 125/56 | HR 72 | Temp 97.8°F | Ht 68.0 in | Wt 208.0 lb

## 2020-11-30 DIAGNOSIS — E669 Obesity, unspecified: Secondary | ICD-10-CM

## 2020-11-30 DIAGNOSIS — E1169 Type 2 diabetes mellitus with other specified complication: Secondary | ICD-10-CM | POA: Diagnosis not present

## 2020-11-30 DIAGNOSIS — E559 Vitamin D deficiency, unspecified: Secondary | ICD-10-CM

## 2020-11-30 DIAGNOSIS — Z6833 Body mass index (BMI) 33.0-33.9, adult: Secondary | ICD-10-CM

## 2020-11-30 MED ORDER — VITAMIN D (ERGOCALCIFEROL) 1.25 MG (50000 UNIT) PO CAPS: 50000.0000 [IU] | ORAL_CAPSULE | ORAL | 0 refills | Status: DC

## 2020-12-01 NOTE — Progress Notes (Signed)
Chief Complaint:   OBESITY Vickie Nichols is here to discuss her progress with her obesity treatment plan along with follow-up of her obesity related diagnoses. Vickie Nichols is on the Category 2 Plan with lunch options and protein equivalents and states she is following her eating plan approximately 90% of the time. Vickie Nichols states she is doing 0 minutes 0 times per week.  Today's visit was #: 3 Starting weight: 219 lbs Starting date: 10/28/2020 Today's weight: 208 lbs Today's date: 11/30/2020 Total lbs lost to date: 11 Total lbs lost since last in-office visit: 5  Interim History: Vickie Nichols did excellent over Thanksgiving. She ate [place] and veggies, but no desserts. She recognizes that she emotionally eats. She desires to start walking, and she used to do 3 miles per day.  Subjective:   1. Type 2 diabetes mellitus with other specified complication, without long-term current use of insulin (HCC) Vickie Nichols doesn't check her blood sugars at home (never has). She is taking Januvia, and she has no concerns or symptoms.  2. Vitamin D deficiency Vickie Nichols is tolerating prescription Vit D well. She started at her last office visit, and she denies issues.  Assessment/Plan:  No orders of the defined types were placed in this encounter.   Medications Discontinued During This Encounter  Medication Reason   Vitamin D, Ergocalciferol, (DRISDOL) 1.25 MG (50000 UNIT) CAPS capsule Reorder     Meds ordered this encounter  Medications   Vitamin D, Ergocalciferol, (DRISDOL) 1.25 MG (50000 UNIT) CAPS capsule    Sig: Take 1 capsule (50,000 Units total) by mouth every 7 (seven) days.    Dispense:  4 capsule    Refill:  0    30 d supply;  ** OV for RF **   Do not send RF request     1. Type 2 diabetes mellitus with other specified complication, without long-term current use of insulin (HCC) We will recheck Vickie Nichols's A1c after 3 months of dietary lifestyle changes approximately at the end of January.  We discussed that in the future we can change her regimen if appropriate. Good blood sugar control is important to decrease the likelihood of diabetic complications such as nephropathy, neuropathy, limb loss, blindness, coronary artery disease, and death. Intensive lifestyle modification including diet, exercise and weight loss are the first line of treatment for diabetes.   2. Vitamin D deficiency Low Vitamin D level contributes to fatigue and are associated with obesity, breast, and colon cancer. We will refill prescription Vitamin D 50,000 IU every week for 1 month. Vickie Nichols will follow-up for routine testing of Vitamin D, at least 2-3 times per year to avoid over-replacement.  - Vitamin D, Ergocalciferol, (DRISDOL) 1.25 MG (50000 UNIT) CAPS capsule; Take 1 capsule (50,000 Units total) by mouth every 7 (seven) days.  Dispense: 4 capsule; Refill: 0  3. Obesity with current BMI of 31.6 Vickie Nichols is currently in the action stage of change. As such, her goal is to continue with weight loss efforts. She has agreed to the Category 2 Plan with lunch options and protein equivalents.   Exercise goals: All adults should avoid inactivity. Some physical activity is better than none, and adults who participate in any amount of physical activity gain some health benefits. Vickie Nichols desires to start walking for 20 minutes 3 days per week.  Behavioral modification strategies: increasing lean protein intake and decreasing simple carbohydrates.  Vickie Nichols has agreed to follow-up with our clinic in 2 to 3 weeks. She was informed of the importance of  frequent follow-up visits to maximize her success with intensive lifestyle modifications for her multiple health conditions.   Objective:   Blood pressure (!) 125/56, pulse 72, temperature 97.8 F (36.6 C), height 5\' 8"  (1.727 m), weight 208 lb (94.3 kg), SpO2 99 %. Body mass index is 31.63 kg/m.  General: Cooperative, alert, well developed, in no acute  distress. HEENT: Conjunctivae and lids unremarkable. Cardiovascular: Regular rhythm.  Lungs: Normal work of breathing. Neurologic: No focal deficits.   Lab Results  Component Value Date   CREATININE 0.94 10/01/2020   BUN 12 10/01/2020   NA 141 10/01/2020   K 3.8 10/01/2020   CL 104 10/01/2020   CO2 30 10/01/2020   Lab Results  Component Value Date   ALT 52 (H) 10/01/2020   AST 40 (H) 10/01/2020   ALKPHOS 68 10/01/2020   BILITOT 0.5 10/01/2020   Lab Results  Component Value Date   HGBA1C 7.1 (H) 10/01/2020   HGBA1C 6.5 06/21/2018   HGBA1C 6.0 08/24/2016   HGBA1C 7.0 (H) 11/08/2015   HGBA1C 7.2 (H) 11/18/2014   Lab Results  Component Value Date   INSULIN 19.5 10/28/2020   Lab Results  Component Value Date   TSH 1.400 10/28/2020   Lab Results  Component Value Date   CHOL 197 10/28/2020   HDL 57 10/28/2020   LDLCALC 120 (H) 10/28/2020   LDLDIRECT 169.3 06/28/2012   TRIG 112 10/28/2020   CHOLHDL 4 06/21/2018   Lab Results  Component Value Date   VD25OH 23.3 (L) 10/28/2020   Lab Results  Component Value Date   WBC 8.4 09/16/2020   HGB 13.7 09/16/2020   HCT 41.7 09/16/2020   MCV 93.7 09/16/2020   PLT 247.0 09/16/2020   No results found for: IRON, TIBC, FERRITIN  Obesity Behavioral Intervention:   Approximately 15 minutes were spent on the discussion below.  ASK: We discussed the diagnosis of obesity with Vickie Nichols today and Vickie Nichols agreed to give [place] permission to discuss obesity behavioral modification therapy today.  ASSESS: Vickie Nichols has the diagnosis of obesity and her BMI today is 31.6. Vickie Nichols is in the action stage of change.   ADVISE: Vickie Nichols was educated on the multiple health risks of obesity as well as the benefit of weight loss to improve her health. She was advised of the need for long term treatment and the importance of lifestyle modifications to improve her current health and to decrease her risk of future health  problems.  AGREE: Multiple dietary modification options and treatment options were discussed and Vickie Nichols agreed to follow the recommendations documented in the above note.  ARRANGE: Vickie Nichols was educated on the importance of frequent visits to treat obesity as outlined per CMS and USPSTF guidelines and agreed to schedule her next follow up appointment today.  Attestation Statements:   Reviewed by clinician on day of visit: allergies, medications, problem list, medical history, surgical history, family history, social history, and previous encounter notes.   Vickie Nichols, am acting as transcriptionist for [employer], DO.  I have reviewed the above documentation for accuracy and completeness, and I agree with the above. Vickie Nichols, D.O.  The [place] was signed into law in 2016 which includes the topic of electronic health records.  This provides immediate access to information in MyChart.  This includes consultation notes, operative notes, office notes, lab results and pathology reports.  If you have any questions about what you read please let us know at your  next visit so we can discuss your concerns and take corrective action if need be.  We are right here with you.

## 2020-12-02 ENCOUNTER — Encounter: Payer: Self-pay | Admitting: Family Medicine

## 2020-12-02 ENCOUNTER — Other Ambulatory Visit: Payer: Self-pay

## 2020-12-02 DIAGNOSIS — F320 Major depressive disorder, single episode, mild: Secondary | ICD-10-CM

## 2020-12-02 MED ORDER — BUPROPION HCL ER (XL) 300 MG PO TB24: 300.0000 mg | ORAL_TABLET | Freq: Every day | ORAL | 0 refills | Status: DC

## 2020-12-07 ENCOUNTER — Encounter (INDEPENDENT_AMBULATORY_CARE_PROVIDER_SITE_OTHER): Payer: Self-pay | Admitting: Family Medicine

## 2020-12-07 ENCOUNTER — Other Ambulatory Visit (INDEPENDENT_AMBULATORY_CARE_PROVIDER_SITE_OTHER): Payer: Self-pay | Admitting: Family Medicine

## 2020-12-07 DIAGNOSIS — E559 Vitamin D deficiency, unspecified: Secondary | ICD-10-CM

## 2020-12-07 NOTE — Telephone Encounter (Signed)
Dr.Opalski ?

## 2020-12-07 NOTE — Telephone Encounter (Signed)
Please see message and advise.  Thank you. ° °

## 2020-12-14 ENCOUNTER — Ambulatory Visit (INDEPENDENT_AMBULATORY_CARE_PROVIDER_SITE_OTHER): Payer: Medicare Other | Admitting: Family Medicine

## 2020-12-14 ENCOUNTER — Other Ambulatory Visit: Payer: Self-pay

## 2020-12-14 ENCOUNTER — Encounter (INDEPENDENT_AMBULATORY_CARE_PROVIDER_SITE_OTHER): Payer: Self-pay | Admitting: Family Medicine

## 2020-12-14 VITALS — BP 124/78 | HR 59 | Ht 68.0 in | Wt 204.0 lb

## 2020-12-14 DIAGNOSIS — E1169 Type 2 diabetes mellitus with other specified complication: Secondary | ICD-10-CM | POA: Diagnosis not present

## 2020-12-14 DIAGNOSIS — E669 Obesity, unspecified: Secondary | ICD-10-CM

## 2020-12-14 DIAGNOSIS — Z6833 Body mass index (BMI) 33.0-33.9, adult: Secondary | ICD-10-CM

## 2020-12-14 DIAGNOSIS — F39 Unspecified mood [affective] disorder: Secondary | ICD-10-CM

## 2020-12-14 DIAGNOSIS — E559 Vitamin D deficiency, unspecified: Secondary | ICD-10-CM

## 2020-12-14 NOTE — Progress Notes (Signed)
Chief Complaint:   OBESITY Vickie Nichols is here to discuss her progress with her obesity treatment plan along with follow-up of her obesity related diagnoses. Vickie Nichols is on the Category 2 Plan with lunch options and states she is following her eating plan approximately 85% of the time. Vickie Nichols states she is walking 30 minutes 2 times per week.  Today's visit was #: 4 Starting weight: 219 lbs Starting date: 10/28/2020 Today's weight: 204 lbs Today's date: 12/14/2020 Total lbs lost to date: 15 Total lbs lost since last in-office visit: 4  Interim History: Vickie Nichols is here for a follow up office visit.  We reviewed her meal plan and questions were answered.  Patient's food recall appears to be accurate and consistent with what is on plan when she is following it.   When eating on plan, her hunger and cravings are well controlled.   Pt wasn't expecting a 4 lb weight loss, as she overate at Thanksgiving. She occasionally skips meal- 2 days in the past couple of weeks. Pt endorses she over uses her snack calories.  Subjective:   1. Type 2 diabetes mellitus with other specified complication, without long-term current use of insulin (HCC) Pt is not checking her blood sugar but has a monitor. She has no concerns. Medication: Januvia  2. Mood disorder ([place]) with emotional eating She is on Wellbutrin. Pt denies emotional eating but does eat out of habit- popcorn during a movie.  3. Vitamin D deficiency She is currently taking prescription vitamin D 50,000 IU each week. She denies nausea, vomiting or muscle weakness. No issues with compliance.  Assessment/Plan:  No orders of the defined types were placed in this encounter.   There are no discontinued medications.   No orders of the defined types were placed in this encounter.    1. Type 2 diabetes mellitus with other specified complication, without long-term current use of insulin (HCC) Good blood sugar control is important to  decrease the likelihood of diabetic complications such as nephropathy, neuropathy, limb loss, blindness, coronary artery disease, and death. Intensive lifestyle modification including diet, exercise and weight loss are the first line of treatment for diabetes.  Pt desires to get off meds all together.  2. Mood disorder ([place]) with emotional eating Behavior modification techniques were discussed today to help Vickie Nichols deal with her emotional/non-hunger eating behaviors.  Orders and follow up as documented in patient record.   3. Vitamin D deficiency Low Vitamin D level contributes to fatigue and are associated with obesity, breast, and colon cancer. She agrees to continue to take prescription Vitamin D 50,000 IU every week and will follow-up for routine testing of Vitamin D, at least 2-3 times per year to avoid over-replacement.  4. Obesity with current BMI of 31.1  Vickie Nichols is currently in the action stage of change. As such, her goal is to continue with weight loss efforts. She has agreed to the Category 2 Plan with lunch options.   Practice mindful eating. Handouts given after discussion with pt.  Exercise goals:  Pt's goal is to walk for 30 minutes 3 days a week.  Behavioral modification strategies: increasing lean protein intake, decreasing simple carbohydrates, holiday eating strategies , and avoiding temptations.  Vickie Nichols has agreed to follow-up with our clinic in 3 weeks. She was informed of the importance of frequent follow-up visits to maximize her success with intensive lifestyle modifications for her multiple health conditions.   Objective:   Blood pressure 124/78, pulse (!) 59,  height 5\' 8"  (1.727 m), weight 204 lb (92.5 kg), SpO2 100 %. Body mass index is 31.02 kg/m.  General: Cooperative, alert, well developed, in no acute distress. HEENT: Conjunctivae and lids unremarkable. Cardiovascular: Regular rhythm.  Lungs: Normal work of breathing. Neurologic: No focal deficits.    Lab Results  Component Value Date   CREATININE 0.94 10/01/2020   BUN 12 10/01/2020   NA 141 10/01/2020   K 3.8 10/01/2020   CL 104 10/01/2020   CO2 30 10/01/2020   Lab Results  Component Value Date   ALT 52 (H) 10/01/2020   AST 40 (H) 10/01/2020   ALKPHOS 68 10/01/2020   BILITOT 0.5 10/01/2020   Lab Results  Component Value Date   HGBA1C 7.1 (H) 10/01/2020   HGBA1C 6.5 06/21/2018   HGBA1C 6.0 08/24/2016   HGBA1C 7.0 (H) 11/08/2015   HGBA1C 7.2 (H) 11/18/2014   Lab Results  Component Value Date   INSULIN 19.5 10/28/2020   Lab Results  Component Value Date   TSH 1.400 10/28/2020   Lab Results  Component Value Date   CHOL 197 10/28/2020   HDL 57 10/28/2020   LDLCALC 120 (H) 10/28/2020   LDLDIRECT 169.3 06/28/2012   TRIG 112 10/28/2020   CHOLHDL 4 06/21/2018   Lab Results  Component Value Date   VD25OH 23.3 (L) 10/28/2020   Lab Results  Component Value Date   WBC 8.4 09/16/2020   HGB 13.7 09/16/2020   HCT 41.7 09/16/2020   MCV 93.7 09/16/2020   PLT 247.0 09/16/2020   No results found for: IRON, TIBC, FERRITIN  Obesity Behavioral Intervention:   Approximately 15 minutes were spent on the discussion below.  ASK: We discussed the diagnosis of obesity with Vickie Nichols today and Vickie Nichols agreed to give [place] permission to discuss obesity behavioral modification therapy today.  ASSESS: Vickie Nichols has the diagnosis of obesity and her BMI today is 31. Vickie Nichols is in the action stage of change.   ADVISE: Vickie Nichols was educated on the multiple health risks of obesity as well as the benefit of weight loss to improve her health. She was advised of the need for long term treatment and the importance of lifestyle modifications to improve her current health and to decrease her risk of future health problems.  AGREE: Multiple dietary modification options and treatment options were discussed and Vickie Nichols agreed to follow the recommendations documented in the above  note.  ARRANGE: Vickie Nichols was educated on the importance of frequent visits to treat obesity as outlined per CMS and USPSTF guidelines and agreed to schedule her next follow up appointment today.  Attestation Statements:   Reviewed by clinician on day of visit: allergies, medications, problem list, medical history, surgical history, family history, social history, and previous encounter notes.  Coral Ceo, CMA, am acting as transcriptionist for [employer], DO.  I have reviewed the above documentation for accuracy and completeness, and I agree with the above. Marjory Sneddon, D.O.  The [place] was signed into law in 2016 which includes the topic of electronic health records.  This provides immediate access to information in MyChart.  This includes consultation notes, operative notes, office notes, lab results and pathology reports.  If you have any questions about what you read please let us know at your next visit so we can discuss your concerns and take corrective action if need be.  We are right here with you.

## 2020-12-14 NOTE — Telephone Encounter (Signed)
Pt had an in office visit today, with Dr. Raliegh Scarlet today.  She has informed the patient about her vit D.

## 2021-01-04 ENCOUNTER — Ambulatory Visit (INDEPENDENT_AMBULATORY_CARE_PROVIDER_SITE_OTHER): Payer: Medicare Other | Admitting: Family Medicine

## 2021-01-04 ENCOUNTER — Encounter (INDEPENDENT_AMBULATORY_CARE_PROVIDER_SITE_OTHER): Payer: Self-pay | Admitting: Family Medicine

## 2021-01-04 ENCOUNTER — Other Ambulatory Visit: Payer: Self-pay

## 2021-01-04 ENCOUNTER — Other Ambulatory Visit (INDEPENDENT_AMBULATORY_CARE_PROVIDER_SITE_OTHER): Payer: Self-pay | Admitting: Family Medicine

## 2021-01-04 VITALS — BP 134/71 | HR 61 | Temp 98.3°F | Ht 68.0 in | Wt 205.0 lb

## 2021-01-04 DIAGNOSIS — Z6831 Body mass index (BMI) 31.0-31.9, adult: Secondary | ICD-10-CM | POA: Diagnosis not present

## 2021-01-04 DIAGNOSIS — E1169 Type 2 diabetes mellitus with other specified complication: Secondary | ICD-10-CM | POA: Diagnosis not present

## 2021-01-04 DIAGNOSIS — E559 Vitamin D deficiency, unspecified: Secondary | ICD-10-CM

## 2021-01-04 DIAGNOSIS — E669 Obesity, unspecified: Secondary | ICD-10-CM | POA: Diagnosis not present

## 2021-01-04 MED ORDER — VITAMIN D (ERGOCALCIFEROL) 1.25 MG (50000 UNIT) PO CAPS: 50000.0000 [IU] | ORAL_CAPSULE | ORAL | 0 refills | Status: DC

## 2021-01-04 NOTE — Telephone Encounter (Signed)
Dr.Opalski ?

## 2021-01-06 NOTE — Progress Notes (Signed)
Chief Complaint:   OBESITY Milda is here to discuss her progress with her obesity treatment plan along with follow-up of her obesity related diagnoses. Darriona is on the Category 2 Plan with lunch options and states she is following her eating plan approximately 65% of the time. Hollee states she was walking 2 miles for 1 day, walking for 30-60 minutes 3 times per week.  Today's visit was #: 5 Starting weight: 219 lbs Starting date: 10/28/2020 Today's weight: 205 lbs Today's date: 01/04/2021 Total lbs lost to date: 14 Total lbs lost since last in-office visit: 0  Interim History: Joshua is glad that she only gained 1 lb with the celebrations and traveling she had over the holidays. She even started back at the gym yesterday.   Subjective:   1. Vitamin D deficiency Lakechia has not had a bone density yet, and she will schedule and appointment with her primary care physician for CPE.   2. Type 2 diabetes mellitus with other specified complication, without long-term current use of insulin (HCC) Rahcel's fasting blood sugar today was 140. She has started checking them. She likes checking her blood sugars and educating herself on what these foods do to her body.  Assessment/Plan:  No orders of the defined types were placed in this encounter.   Medications Discontinued During This Encounter  Medication Reason   Vitamin D, Ergocalciferol, (DRISDOL) 1.25 MG (50000 UNIT) CAPS capsule Reorder     Meds ordered this encounter  Medications   Vitamin D, Ergocalciferol, (DRISDOL) 1.25 MG (50000 UNIT) CAPS capsule    Sig: Take 1 capsule (50,000 Units total) by mouth every 7 (seven) days.    Dispense:  4 capsule    Refill:  0    30 d supply;  ** OV for RF **   Do not send RF request     1. Vitamin D deficiency Meka will continue prescription Vitamin D 50,000 IU every week, and we will refill for 1 month. She will follow-up for routine testing of Vitamin D, at least 2-3  times per year to avoid over-replacement.  - Vitamin D, Ergocalciferol, (DRISDOL) 1.25 MG (50000 UNIT) CAPS capsule; Take 1 capsule (50,000 Units total) by mouth every 7 (seven) days.  Dispense: 4 capsule; Refill: 0  2. Type 2 diabetes mellitus with other specified complication, without long-term current use of insulin (HCC) Landrey is to check her blood sugars, get back on the plan, and increase her exercise. Good blood sugar control is important to decrease the likelihood of diabetic complications such as nephropathy, neuropathy, limb loss, blindness, coronary artery disease, and death. Intensive lifestyle modification including diet, exercise and weight loss are the first line of treatment for diabetes.   3. Obesity with current BMI of 31.3 Austin is currently in the action stage of change. As such, her goal is to continue with weight loss efforts. She has agreed to the Category 2 Plan with lunch options.   Exercise goals: As is.  Behavioral modification strategies: avoiding temptations and planning for success.  Shanzay has agreed to follow-up with our clinic in 2 to 3 weeks. She was informed of the importance of frequent follow-up visits to maximize her success with intensive lifestyle modifications for her multiple health conditions.   Objective:   Blood pressure 134/71, pulse 61, temperature 98.3 F (36.8 C), height 5\' 8"  (1.727 m), weight 205 lb (93 kg), SpO2 99 %. Body mass index is 31.17 kg/m.  General: Cooperative, alert, well developed, in  no acute distress. HEENT: Conjunctivae and lids unremarkable. Cardiovascular: Regular rhythm.  Lungs: Normal work of breathing. Neurologic: No focal deficits.   Lab Results  Component Value Date   CREATININE 0.94 10/01/2020   BUN 12 10/01/2020   NA 141 10/01/2020   K 3.8 10/01/2020   CL 104 10/01/2020   CO2 30 10/01/2020   Lab Results  Component Value Date   ALT 52 (H) 10/01/2020   AST 40 (H) 10/01/2020   ALKPHOS 68  10/01/2020   BILITOT 0.5 10/01/2020   Lab Results  Component Value Date   HGBA1C 7.1 (H) 10/01/2020   HGBA1C 6.5 06/21/2018   HGBA1C 6.0 08/24/2016   HGBA1C 7.0 (H) 11/08/2015   HGBA1C 7.2 (H) 11/18/2014   Lab Results  Component Value Date   INSULIN 19.5 10/28/2020   Lab Results  Component Value Date   TSH 1.400 10/28/2020   Lab Results  Component Value Date   CHOL 197 10/28/2020   HDL 57 10/28/2020   LDLCALC 120 (H) 10/28/2020   LDLDIRECT 169.3 06/28/2012   TRIG 112 10/28/2020   CHOLHDL 4 06/21/2018   Lab Results  Component Value Date   VD25OH 23.3 (L) 10/28/2020   Lab Results  Component Value Date   WBC 8.4 09/16/2020   HGB 13.7 09/16/2020   HCT 41.7 09/16/2020   MCV 93.7 09/16/2020   PLT 247.0 09/16/2020   No results found for: IRON, TIBC, FERRITIN  Obesity Behavioral Intervention:   Approximately 15 minutes were spent on the discussion below.  ASK: We discussed the diagnosis of obesity with [name] today and Breya agreed to give [place] permission to discuss obesity behavioral modification therapy today.  ASSESS: Yona has the diagnosis of obesity and her BMI today is 31.3. Kyliana is in the action stage of change.   ADVISE: Taysia was educated on the multiple health risks of obesity as well as the benefit of weight loss to improve her health. She was advised of the need for long term treatment and the importance of lifestyle modifications to improve her current health and to decrease her risk of future health problems.  AGREE: Multiple dietary modification options and treatment options were discussed and Phyliss agreed to follow the recommendations documented in the above note.  ARRANGE: Dilcia was educated on the importance of frequent visits to treat obesity as outlined per CMS and USPSTF guidelines and agreed to schedule her next follow up appointment today.  Attestation Statements:   Reviewed by clinician on day of visit: allergies,  medications, problem list, medical history, surgical history, family history, social history, and previous encounter notes.   Wilhemena Durie, am acting as transcriptionist for [employer], DO.  I have reviewed the above documentation for accuracy and completeness, and I agree with the above. Marjory Sneddon, D.O.  The [place] was signed into law in 2016 which includes the topic of electronic health records.  This provides immediate access to information in MyChart.  This includes consultation notes, operative notes, office notes, lab results and pathology reports.  If you have any questions about what you read please let us know at your next visit so we can discuss your concerns and take corrective action if need be.  We are right here with you.

## 2021-01-16 ENCOUNTER — Other Ambulatory Visit: Payer: Self-pay | Admitting: Family Medicine

## 2021-01-18 ENCOUNTER — Encounter (INDEPENDENT_AMBULATORY_CARE_PROVIDER_SITE_OTHER): Payer: Self-pay | Admitting: Family Medicine

## 2021-01-18 ENCOUNTER — Other Ambulatory Visit: Payer: Self-pay

## 2021-01-18 ENCOUNTER — Ambulatory Visit (INDEPENDENT_AMBULATORY_CARE_PROVIDER_SITE_OTHER): Payer: Medicare Other | Admitting: Family Medicine

## 2021-01-18 VITALS — BP 134/69 | HR 65 | Temp 98.1°F | Ht 68.0 in | Wt 202.0 lb

## 2021-01-18 DIAGNOSIS — E1169 Type 2 diabetes mellitus with other specified complication: Secondary | ICD-10-CM

## 2021-01-18 DIAGNOSIS — E669 Obesity, unspecified: Secondary | ICD-10-CM | POA: Diagnosis not present

## 2021-01-18 DIAGNOSIS — E559 Vitamin D deficiency, unspecified: Secondary | ICD-10-CM | POA: Diagnosis not present

## 2021-01-18 DIAGNOSIS — F39 Unspecified mood [affective] disorder: Secondary | ICD-10-CM | POA: Diagnosis not present

## 2021-01-18 DIAGNOSIS — Z6830 Body mass index (BMI) 30.0-30.9, adult: Secondary | ICD-10-CM | POA: Diagnosis not present

## 2021-01-18 MED ORDER — VITAMIN D (ERGOCALCIFEROL) 1.25 MG (50000 UNIT) PO CAPS: 50000.0000 [IU] | ORAL_CAPSULE | ORAL | 0 refills | Status: DC

## 2021-01-18 NOTE — Progress Notes (Signed)
Chief Complaint:   OBESITY Vickie Nichols is here to discuss her progress with her obesity treatment plan along with follow-up of her obesity related diagnoses. Brunette is on the Category 2 Plan with lunch options and states she is following her eating plan approximately 75% of the time. Wave states she is not currently exercising.  Today's visit was #: 6 Starting weight: 219 lbs Starting date: 10/28/2020 Today's weight: 202 lbs Today's date: 01/18/2021 Total lbs lost to date: 17 Total lbs lost since last in-office visit: 3  Interim History: Vickie Nichols is here for a follow up office visit. We reviewed her meal plan and questions were answered. Patient's food recall appears to be accurate and consistent with what is on plan when she is following it. When eating on plan, her hunger and cravings are well controlled. Vickie Nichols's daughter is an Therapist, sports at [facility] ED, and she has been very supportive of Sylvie.   Subjective:   1. Type 2 diabetes mellitus with other specified complication, without long-term current use of insulin (HCC) Vickie Nichols's fasting Bgs range between 90's-100's, and she is still checking them. She is on Januvia per her primary care physician. She notes that Celesta Gentile is very expensive for her now that she is on Medicare. She will not be able to afford it in the future.  2. Vitamin D deficiency Vickie Nichols is currently taking prescription vitamin D 50,000 IU each week. She denies nausea, vomiting or muscle weakness.  3. 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 She is still on Wellbutrin and she is tolerating it well. Her mood has been great and symptoms are under control with the lifestyle changes she has made while in the program.   Assessment/Plan:  No orders of the defined types were placed in this encounter.   Medications Discontinued During This Encounter  Medication Reason   Vitamin D,  Ergocalciferol, (DRISDOL) 1.25 MG (50000 UNIT) CAPS capsule Reorder     Meds ordered this encounter  Medications   Vitamin D, Ergocalciferol, (DRISDOL) 1.25 MG (50000 UNIT) CAPS capsule    Sig: Take 1 capsule (50,000 Units total) by mouth every 7 (seven) days.    Dispense:  4 capsule    Refill:  0    30 d supply;  ** OV for RF **   Do not send RF request     1. Type 2 diabetes mellitus with other specified complication, without long-term current use of insulin (HCC) Rachell will continue her current regimen for now, and we will discuss options in the future for her. Good blood sugar control is important to decrease the likelihood of diabetic complications such as nephropathy, neuropathy, limb loss, blindness, coronary artery disease, and death. Intensive lifestyle modification including diet, exercise and weight loss are the first line of treatment for diabetes.   2. Vitamin D deficiency We will refill prescription Vitamin D for 1 month. [name] will follow-up for routine testing of Vitamin D, at least 2-3 times per year to avoid over-replacement.  - Vitamin D, Ergocalciferol, (DRISDOL) 1.25 MG (50000 UNIT) CAPS capsule; Take 1 capsule (50,000 Units total) by mouth every 7 (seven) days.  Dispense: 4 capsule; Refill: 0  3. 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 She denies emotional eating as a current issue.   4. Obesity with current BMI of 30.8 Vickie Nichols is currently in the action stage of change.  As such, her goal is to continue with weight loss efforts. She has agreed to the Category 2 Plan with lunch options.   Vickie Nichols is to focus on eating all of her protein. She is to get back to moving at the gym or other places.   Exercise goals: Older adults should follow the adult guidelines. When older adults cannot meet the adult guidelines, they should be as physically active as their  abilities and conditions will allow.   Behavioral modification strategies: increasing lean protein intake and decreasing simple carbohydrates.  Vickie Nichols has agreed to follow-up with our clinic in 2 to 3 weeks. She was informed of the importance of frequent follow-up visits to maximize her success with intensive lifestyle modifications for her multiple health conditions.   Objective:   Blood pressure 134/69, pulse 65, temperature 98.1 F (36.7 C), height 5\' 8"  (1.727 m), weight 202 lb (91.6 kg), SpO2 99 %. Body mass index is 30.71 kg/m.  General: Cooperative, alert, well developed, in no acute distress. HEENT: Conjunctivae and lids unremarkable. Cardiovascular: Regular rhythm.  Lungs: Normal work of breathing. Neurologic: No focal deficits.   Lab Results  Component Value Date   CREATININE 0.94 10/01/2020   BUN 12 10/01/2020   NA 141 10/01/2020   K 3.8 10/01/2020   CL 104 10/01/2020   CO2 30 10/01/2020   Lab Results  Component Value Date   ALT 52 (H) 10/01/2020   AST 40 (H) 10/01/2020   ALKPHOS 68 10/01/2020   BILITOT 0.5 10/01/2020   Lab Results  Component Value Date   HGBA1C 7.1 (H) 10/01/2020   HGBA1C 6.5 06/21/2018   HGBA1C 6.0 08/24/2016   HGBA1C 7.0 (H) 11/08/2015   HGBA1C 7.2 (H) 11/18/2014   Lab Results  Component Value Date   INSULIN 19.5 10/28/2020   Lab Results  Component Value Date   TSH 1.400 10/28/2020   Lab Results  Component Value Date   CHOL 197 10/28/2020   HDL 57 10/28/2020   LDLCALC 120 (H) 10/28/2020   LDLDIRECT 169.3 06/28/2012   TRIG 112 10/28/2020   CHOLHDL 4 06/21/2018   Lab Results  Component Value Date   VD25OH 23.3 (L) 10/28/2020   Lab Results  Component Value Date   WBC 8.4 09/16/2020   HGB 13.7 09/16/2020   HCT 41.7 09/16/2020   MCV 93.7 09/16/2020   PLT 247.0 09/16/2020   No results found for: IRON, TIBC, FERRITIN  Obesity Behavioral Intervention:   Approximately 15 minutes were spent on the discussion  below.  ASK: We discussed the diagnosis of obesity with Vickie Nichols today and Vickie Nichols agreed to give [place] permission to discuss obesity behavioral modification therapy today.  ASSESS: Vickie Nichols has the diagnosis of obesity and her BMI today is 30.8. Chennel is in the action stage of change.   ADVISE: Dayla was educated on the multiple health risks of obesity as well as the benefit of weight loss to improve her health. She was advised of the need for long term treatment and the importance of lifestyle modifications to improve her current health and to decrease her risk of future health problems.  AGREE: Multiple dietary modification options and treatment options were discussed and Rosario agreed to follow the recommendations documented in the above note.  ARRANGE: Jolanta was educated on the importance of frequent visits to treat obesity as outlined per CMS and USPSTF guidelines and agreed to schedule her next follow up appointment today.  Attestation Statements:   Reviewed by clinician on day of  visit: allergies, medications, problem list, medical history, surgical history, family history, social history, and previous encounter notes.   Wilhemena Durie, am acting as transcriptionist for [employer], DO.  I have reviewed the above documentation for accuracy and completeness, and I agree with the above. Marjory Sneddon, D.O.  The [place] was signed into law in 2016 which includes the topic of electronic health records.  This provides immediate access to information in MyChart.  This includes consultation notes, operative notes, office notes, lab results and pathology reports.  If you have any questions about what you read please let us know at your next visit so we can discuss your concerns and take corrective action if need be.  We are right here with you.

## 2021-01-31 ENCOUNTER — Encounter (INDEPENDENT_AMBULATORY_CARE_PROVIDER_SITE_OTHER): Payer: Self-pay | Admitting: Family Medicine

## 2021-01-31 ENCOUNTER — Ambulatory Visit (INDEPENDENT_AMBULATORY_CARE_PROVIDER_SITE_OTHER): Payer: Medicare Other | Admitting: Family Medicine

## 2021-01-31 ENCOUNTER — Other Ambulatory Visit: Payer: Self-pay

## 2021-01-31 VITALS — BP 139/73 | HR 61 | Temp 97.8°F | Ht 68.0 in | Wt 203.0 lb

## 2021-01-31 DIAGNOSIS — Z6830 Body mass index (BMI) 30.0-30.9, adult: Secondary | ICD-10-CM | POA: Diagnosis not present

## 2021-01-31 DIAGNOSIS — E559 Vitamin D deficiency, unspecified: Secondary | ICD-10-CM | POA: Diagnosis not present

## 2021-01-31 DIAGNOSIS — Z6833 Body mass index (BMI) 33.0-33.9, adult: Secondary | ICD-10-CM

## 2021-01-31 DIAGNOSIS — E785 Hyperlipidemia, unspecified: Secondary | ICD-10-CM

## 2021-01-31 DIAGNOSIS — E1169 Type 2 diabetes mellitus with other specified complication: Secondary | ICD-10-CM

## 2021-01-31 DIAGNOSIS — E669 Obesity, unspecified: Secondary | ICD-10-CM

## 2021-01-31 MED ORDER — METFORMIN HCL 500 MG PO TABS: ORAL_TABLET | ORAL | 0 refills | Status: DC

## 2021-01-31 MED ORDER — VITAMIN D (ERGOCALCIFEROL) 1.25 MG (50000 UNIT) PO CAPS: 50000.0000 [IU] | ORAL_CAPSULE | ORAL | 0 refills | Status: DC

## 2021-02-01 LAB — HEMOGLOBIN A1C
Est. average glucose Bld gHb Est-mCnc: 128 mg/dL
Hgb A1c MFr Bld: 6.1 % — ABNORMAL HIGH (ref 4.8–5.6)

## 2021-02-01 NOTE — Progress Notes (Addendum)
Chief Complaint:   OBESITY Vickie Nichols is here to discuss her progress with her obesity treatment plan along with follow-up of her obesity related diagnoses. Vickie Nichols is on the Category 2 Plan with lunch options and states she is following her eating plan approximately 75% of the time. [name] states she is walking for 60 minutes 2-3 times per week.  Today's visit was #: 7 Starting weight: 219 lbs Starting date: 10/28/2020 Today's weight: 203 lbs Today's date: 01/31/2021 Total lbs lost to date: 16 Total lbs lost since last in-office visit: 0  Interim History: Vickie Nichols is eating less on the plan lately and eating more carbohydrates and less protein - resulting in 1lb wt gain.   After dinner, she is snacking more.  But on a positive note, she has been walking for 60 minutes, 3 days per week.     Subjective:   1. Type 2 diabetes mellitus with other specified complication, without long-term current use of insulin ([place]) [name] notes carbohydrate cravings such as gummies, chips, and salsa etc. She is on Januvia now. Her last A1c was 7.1 four months ago. Her fasting blood sugars range in the 100's and has been much better overall than prior.   2. Vitamin D deficiency Vickie Nichols is currently taking prescription vitamin D 50,000 IU each week. She denies nausea, vomiting or muscle weakness.  3. Hyperlipidemia associated with type 2 diabetes mellitus ([place]) Vickie Nichols is not on medications currently, and she is due to have labs rechecked soon.    Assessment/Plan:   Orders Placed This Encounter  Procedures   Hemoglobin A1c   Lipid Panel With LDL/HDL Ratio   Comprehensive metabolic panel   VITAMIN D 25 Hydroxy (Vit-D Deficiency, Fractures)   Insulin, random    Medications Discontinued During This Encounter  Medication Reason   Vitamin D, Ergocalciferol, (DRISDOL) 1.25 MG (50000 UNIT) CAPS capsule Reorder   metFORMIN (GLUCOPHAGE-XR) 750 MG 24 hr tablet Entry Error   metFORMIN  (GLUCOPHAGE) 500 MG tablet      Meds ordered this encounter  Medications   Vitamin D, Ergocalciferol, (DRISDOL) 1.25 MG (50000 UNIT) CAPS capsule    Sig: Take 1 capsule (50,000 Units total) by mouth every 7 (seven) days.    Dispense:  4 capsule    Refill:  0    30 d supply;  ** OV for RF **   Do not send RF request   DISCONTD: metFORMIN (GLUCOPHAGE) 500 MG tablet    Sig: 250mg  po qd with lunch    Dispense:  30 tablet    Refill:  0   metFORMIN (GLUCOPHAGE) 500 MG tablet    Sig: 1/2 tab QD with lunch    Dispense:  45 tablet    Refill:  0    90d ; no RF until OV      1. Type 2 diabetes mellitus with other specified complication, without long-term current use of insulin (HCC) Vickie Nichols has been struggling with her cravings and desires medications to help with those cravings.  She agreed to start metformin 250 mg with lunch daily after discussion of risks and benefits of various meds.  - We will check A1c today, and will recheck fasting bldwrk, insulin, prior to her next office visit.  - Hemoglobin A1c - metFORMIN (GLUCOPHAGE) 500 MG tablet; 250mg  po qd with lunch  Dispense: 30 tablet; Refill: 0 - Insulin, random  2. Vitamin D deficiency We will refill prescription Vitamin D for 1 month. We will recheck Vit D 2  days prior to her next office visit. Vickie Nichols will follow-up for routine testing of Vitamin D, at least 2-3 times per year to avoid over-replacement.  - Vitamin D, Ergocalciferol, (DRISDOL) 1.25 MG (50000 UNIT) CAPS capsule; Take 1 capsule (50,000 Units total) by mouth every 7 (seven) days.  Dispense: 4 capsule; Refill: 0 - VITAMIN D 25 Hydroxy (Vit-D Deficiency, Fractures)  3. Hyperlipidemia associated with type 2 diabetes mellitus ([place]) Cardiovascular risk and specific lipid/LDL goals reviewed. We will recheck Lipid panel and CMP prior to her next office visit. Vickie Nichols will continue to work on diet, exercise and weight loss efforts. Orders and follow up as documented in  patient record.   Counseling Intensive lifestyle modifications are the first line treatment for this issue. Dietary changes: Increase soluble fiber. Decrease simple carbohydrates. Exercise changes: Moderate to vigorous-intensity aerobic activity 150 minutes per week if tolerated. Lipid-lowering medications: see documented in medical record.  - Lipid Panel With LDL/HDL Ratio - Comprehensive metabolic panel  4. Obesity with current BMI of 30.9 Vickie Nichols is currently in the action stage of change. As such, her goal is to continue with weight loss efforts. She has agreed to the Category 2 Plan with lunch options.   Vickie Nichols is to come fasting for labs 2 days prior to her next office visit.  Exercise goals: As is.  Behavioral modification strategies: increasing lean protein intake, decreasing simple carbohydrates, and planning for success.  Vickie Nichols has agreed to follow-up with our clinic in 3 weeks. She was informed of the importance of frequent follow-up visits to maximize her success with intensive lifestyle modifications for her multiple health conditions.   Vickie Nichols was informed we would discuss her lab results at her next visit unless there is a critical issue that needs to be addressed sooner. Vickie Nichols agreed to keep her next visit at the agreed upon time to discuss these results.    Objective:   Blood pressure 139/73, pulse 61, temperature 97.8 F (36.6 C), height 5\' 8"  (1.727 m), weight 203 lb (92.1 kg), SpO2 99 %. Body mass index is 30.87 kg/m.  General: Cooperative, alert, well developed, in no acute distress. HEENT: Conjunctivae and lids unremarkable. Cardiovascular: Regular rhythm.  Lungs: Normal work of breathing. Neurologic: No focal deficits.   Lab Results  Component Value Date   CREATININE 0.94 10/01/2020   BUN 12 10/01/2020   NA 141 10/01/2020   K 3.8 10/01/2020   CL 104 10/01/2020   CO2 30 10/01/2020   Lab Results  Component Value Date   ALT 52 (H)  10/01/2020   AST 40 (H) 10/01/2020   ALKPHOS 68 10/01/2020   BILITOT 0.5 10/01/2020   Lab Results  Component Value Date   HGBA1C 6.1 (H) 01/31/2021   HGBA1C 7.1 (H) 10/01/2020   HGBA1C 6.5 06/21/2018   HGBA1C 6.0 08/24/2016   HGBA1C 7.0 (H) 11/08/2015   Lab Results  Component Value Date   INSULIN 19.5 10/28/2020   Lab Results  Component Value Date   TSH 1.400 10/28/2020   Lab Results  Component Value Date   CHOL 197 10/28/2020   HDL 57 10/28/2020   LDLCALC 120 (H) 10/28/2020   LDLDIRECT 169.3 06/28/2012   TRIG 112 10/28/2020   CHOLHDL 4 06/21/2018   Lab Results  Component Value Date   VD25OH 23.3 (L) 10/28/2020   Lab Results  Component Value Date   WBC 8.4 09/16/2020   HGB 13.7 09/16/2020   HCT 41.7 09/16/2020   MCV 93.7 09/16/2020   PLT  247.0 09/16/2020   No results found for: IRON, TIBC, FERRITIN  Obesity Behavioral Intervention:   Approximately 15 minutes were spent on the discussion below.  ASK: We discussed the diagnosis of obesity with [name] today and Jazsmine agreed to give [place] permission to discuss obesity behavioral modification therapy today.  ASSESS: Juli has the diagnosis of obesity and her BMI today is 30.9. Rayola is in the action stage of change.   ADVISE: Alicea was educated on the multiple health risks of obesity as well as the benefit of weight loss to improve her health. She was advised of the need for long term treatment and the importance of lifestyle modifications to improve her current health and to decrease her risk of future health problems.  AGREE: Multiple dietary modification options and treatment options were discussed and Kawana agreed to follow the recommendations documented in the above note.  ARRANGE: Jayah was educated on the importance of frequent visits to treat obesity as outlined per CMS and USPSTF guidelines and agreed to schedule her next follow up appointment today.  Attestation Statements:    Reviewed by clinician on day of visit: allergies, medications, problem list, medical history, surgical history, family history, social history, and previous encounter notes.  Wilhemena Durie, am acting as transcriptionist for [employer], DO.  I have reviewed the above documentation for accuracy and completeness, and I agree with the above. Marjory Sneddon, D.O.  The [place] was signed into law in 2016 which includes the topic of electronic health records.  This provides immediate access to information in MyChart.  This includes consultation notes, operative notes, office notes, lab results and pathology reports.  If you have any questions about what you read please let us know at your next visit so we can discuss your concerns and take corrective action if need be.  We are right here with you.

## 2021-02-01 NOTE — Telephone Encounter (Signed)
Dr.Opalski ?

## 2021-02-03 ENCOUNTER — Other Ambulatory Visit (INDEPENDENT_AMBULATORY_CARE_PROVIDER_SITE_OTHER): Payer: Self-pay | Admitting: Family Medicine

## 2021-02-03 DIAGNOSIS — E1169 Type 2 diabetes mellitus with other specified complication: Secondary | ICD-10-CM

## 2021-02-03 MED ORDER — METFORMIN HCL 500 MG PO TABS
ORAL_TABLET | ORAL | 0 refills | Status: DC
Start: 2021-02-03 — End: 2021-02-07

## 2021-02-07 MED ORDER — METFORMIN HCL 500 MG PO TABS: ORAL_TABLET | ORAL | 0 refills | Status: DC

## 2021-02-07 NOTE — Addendum Note (Signed)
Addended by: Marjory Sneddon on: 02/07/2021 03:47 PM   Modules accepted: Orders

## 2021-02-22 LAB — LIPID PANEL WITH LDL/HDL RATIO
Cholesterol, Total: 190 mg/dL (ref 100–199)
HDL: 50 mg/dL (ref >39)
LDL Chol Calc (NIH): 118 mg/dL — ABNORMAL HIGH (ref 0–99)
LDL/HDL Ratio: 2.4 ratio (ref 0.0–3.2)
Triglycerides: 122 mg/dL (ref 0–149)
VLDL Cholesterol Cal: 22 mg/dL (ref 5–40)

## 2021-02-22 LAB — COMPREHENSIVE METABOLIC PANEL
ALT: 17 IU/L (ref 0–32)
AST: 18 IU/L (ref 0–40)
Albumin/Globulin Ratio: 2.1 (ref 1.2–2.2)
Albumin: 4.7 g/dL (ref 3.8–4.8)
Alkaline Phosphatase: 89 IU/L (ref 44–121)
BUN/Creatinine Ratio: 18 (ref 12–28)
BUN: 17 mg/dL (ref 8–27)
Bilirubin Total: 0.3 mg/dL (ref 0.0–1.2)
CO2: 28 mmol/L (ref 20–29)
Calcium: 10 mg/dL (ref 8.7–10.3)
Chloride: 101 mmol/L (ref 96–106)
Creatinine, Ser: 0.93 mg/dL (ref 0.57–1.00)
Globulin, Total: 2.2 g/dL (ref 1.5–4.5)
Glucose: 112 mg/dL — ABNORMAL HIGH (ref 70–99)
Potassium: 4.6 mmol/L (ref 3.5–5.2)
Sodium: 142 mmol/L (ref 134–144)
Total Protein: 6.9 g/dL (ref 6.0–8.5)
eGFR: 68 mL/min/{1.73_m2} (ref >59)

## 2021-02-22 LAB — INSULIN, RANDOM: INSULIN: 24.5 u[IU]/mL (ref 2.6–24.9)

## 2021-02-22 LAB — VITAMIN D 25 HYDROXY (VIT D DEFICIENCY, FRACTURES): Vit D, 25-Hydroxy: 47.4 ng/mL (ref 30.0–100.0)

## 2021-02-23 ENCOUNTER — Other Ambulatory Visit (INDEPENDENT_AMBULATORY_CARE_PROVIDER_SITE_OTHER): Payer: Self-pay | Admitting: Family Medicine

## 2021-02-23 ENCOUNTER — Encounter (INDEPENDENT_AMBULATORY_CARE_PROVIDER_SITE_OTHER): Payer: Self-pay | Admitting: Family Medicine

## 2021-02-23 ENCOUNTER — Other Ambulatory Visit: Payer: Self-pay

## 2021-02-23 ENCOUNTER — Ambulatory Visit (INDEPENDENT_AMBULATORY_CARE_PROVIDER_SITE_OTHER): Payer: Medicare Other | Admitting: Family Medicine

## 2021-02-23 VITALS — BP 148/71 | HR 62 | Temp 98.2°F | Ht 68.0 in | Wt 202.0 lb

## 2021-02-23 DIAGNOSIS — E785 Hyperlipidemia, unspecified: Secondary | ICD-10-CM | POA: Diagnosis not present

## 2021-02-23 DIAGNOSIS — E559 Vitamin D deficiency, unspecified: Secondary | ICD-10-CM | POA: Diagnosis not present

## 2021-02-23 DIAGNOSIS — E1169 Type 2 diabetes mellitus with other specified complication: Secondary | ICD-10-CM

## 2021-02-23 DIAGNOSIS — K760 Fatty (change of) liver, not elsewhere classified: Secondary | ICD-10-CM | POA: Diagnosis not present

## 2021-02-23 DIAGNOSIS — Z6833 Body mass index (BMI) 33.0-33.9, adult: Secondary | ICD-10-CM

## 2021-02-23 DIAGNOSIS — Z6830 Body mass index (BMI) 30.0-30.9, adult: Secondary | ICD-10-CM

## 2021-02-23 DIAGNOSIS — E669 Obesity, unspecified: Secondary | ICD-10-CM

## 2021-02-23 DIAGNOSIS — Z7984 Long term (current) use of oral hypoglycemic drugs: Secondary | ICD-10-CM

## 2021-02-23 MED ORDER — VITAMIN D (ERGOCALCIFEROL) 1.25 MG (50000 UNIT) PO CAPS: 50000.0000 [IU] | ORAL_CAPSULE | ORAL | 0 refills | Status: DC

## 2021-02-23 MED ORDER — ROSUVASTATIN CALCIUM 5 MG PO TABS: 2.5000 mg | ORAL_TABLET | Freq: Every day | ORAL | 0 refills | Status: DC

## 2021-02-23 MED ORDER — METFORMIN HCL 500 MG PO TABS: ORAL_TABLET | ORAL | 0 refills | Status: DC

## 2021-02-24 NOTE — Progress Notes (Signed)
Chief Complaint:   OBESITY Dedee is here to discuss her progress with her obesity treatment plan along with follow-up of her obesity related diagnoses. Spring is on the Category 2 Plan with lunch options and states she is following her eating plan approximately 60% of the time. Madalena states she is not currently exercising.  Today's visit was #: 8 Starting weight: 219 lbs Starting date: 10/28/2020 Today's weight: 202 lbs Today's date: 02/23/2021 Total lbs lost to date: 17 Total lbs lost since last in-office visit: 1  Interim History: Kinze is happy with weight loss; pleasantly surprised she lost because of how she ate. A lot more snacking and goes over her snack calories by 300+ calories per day. Here to review labs.  Subjective:   1. Type 2 diabetes mellitus with other specified complication, without long-term current use of insulin ([place]) Ryah started metformin last office visit. She is taking Januvia and metformin. Tolerating it well and helping some with cravings. I discussed fasting insulin and A1c with the patient today.  2. NAFLD (nonalcoholic fatty liver disease) Dollie had elevated ALT and AST in the past with weight gain and poor diet at that time. I discussed CMP with the patient today.  3. Hyperlipidemia associated with type 2 diabetes mellitus ([place]) Lorien labs have slightly improved. Slightly worse triglycerides and HDL, and LDL was 120, now 118. I discussed FLP with the patient today.  The 10-year ASCVD risk score (Arnett DK, et al., 2019) is: 13.9%   Values used to calculate the score:     Age: 26 years     Sex: Female     Is Non-Hispanic African American: No     Diabetic: Yes     Tobacco smoker: No     Systolic Blood Pressure: 283 mmHg     Is BP treated: No     HDL Cholesterol: 50 mg/dL     Total Cholesterol: 190 mg/dL  4. Vitamin D deficiency Annastyn is currently taking prescription vitamin D 50,000 IU each week. She denies nausea,  vomiting or muscle weakness.  Assessment/Plan:  No orders of the defined types were placed in this encounter.   Medications Discontinued During This Encounter  Medication Reason   Vitamin D, Ergocalciferol, (DRISDOL) 1.25 MG (50000 UNIT) CAPS capsule Reorder   metFORMIN (GLUCOPHAGE) 500 MG tablet Reorder     Meds ordered this encounter  Medications   rosuvastatin (CRESTOR) 5 MG tablet    Sig: Take 0.5 tablets (2.5 mg total) by mouth at bedtime.    Dispense:  30 tablet    Refill:  0   Vitamin D, Ergocalciferol, (DRISDOL) 1.25 MG (50000 UNIT) CAPS capsule    Sig: Take 1 capsule (50,000 Units total) by mouth every 7 (seven) days.    Dispense:  4 capsule    Refill:  0    30 d supply;  ** OV for RF **   Do not send RF request   metFORMIN (GLUCOPHAGE) 500 MG tablet    Sig: 1 tab QD with lunch    Dispense:  90 tablet    Refill:  0    90d ; no RF until OV     1. Type 2 diabetes mellitus with other specified complication, without long-term current use of insulin ([place]) Tenya agreed to increase metformin to 1 full tablet daily. She will continue Januvia. A1c improved. She will continue weight loss via prudent nutritional plan.  - metFORMIN (GLUCOPHAGE) 500 MG tablet; 1 tab QD with  lunch  Dispense: 90 tablet; Refill: 0  2. NAFLD (nonalcoholic fatty liver disease) Completely resolved. Her labs are within normal limits today. Tricia will continue weight loss and prudent nutritional plan.  3. Hyperlipidemia associated with type 2 diabetes mellitus ([place]) LDL not at goal. [name] agreed to start Crestor today at patient's request for [place] to prescribe. Risks and benefits of the medication were discussed and extensive counseling was done. She will continue to follow her prudent nutritional plan and increase exercise. We will recheck labs in 8-12 weeks.   - rosuvastatin (CRESTOR) 5 MG tablet; Take 0.5 tablets (2.5 mg total) by mouth at bedtime.  Dispense: 30 tablet; Refill: 0  4. Vitamin  D deficiency We will refill prescription Vit D for 1 month. Vitamin D level essentially at goal of close to 50 at 47.4. we are going into Summer, no change in dose needed. Increase outdoor activities/walk.  - Vitamin D, Ergocalciferol, (DRISDOL) 1.25 MG (50000 UNIT) CAPS capsule; Take 1 capsule (50,000 Units total) by mouth every 7 (seven) days.  Dispense: 4 capsule; Refill: 0  5. Obesity with current BMI of 30.8 Nahla is currently in the action stage of change. As such, her goal is to continue with weight loss efforts. She has agreed to the Category 2 Plan with lunch options.   Exercise goals: All adults should avoid inactivity. Some physical activity is better than none, and adults who participate in any amount of physical activity gain some health benefits. (Walking)  Behavioral modification strategies: increasing lean protein intake, decreasing simple carbohydrates, increasing water intake, keeping healthy foods in the home, and better snacking choices.  Rondalyn has agreed to follow-up with our clinic in 2 to 3 weeks. She was informed of the importance of frequent follow-up visits to maximize her success with intensive lifestyle modifications for her multiple health conditions.   Objective:   Blood pressure (!) 148/71, pulse 62, temperature 98.2 F (36.8 C), height 5\' 8"  (1.727 m), weight 202 lb (91.6 kg), SpO2 98 %. Body mass index is 30.71 kg/m.  General: Cooperative, alert, well developed, in no acute distress. HEENT: Conjunctivae and lids unremarkable. Cardiovascular: Regular rhythm.  Lungs: Normal work of breathing. Neurologic: No focal deficits.   Lab Results  Component Value Date   CREATININE 0.93 02/21/2021   BUN 17 02/21/2021   NA 142 02/21/2021   K 4.6 02/21/2021   CL 101 02/21/2021   CO2 28 02/21/2021   Lab Results  Component Value Date   ALT 17 02/21/2021   AST 18 02/21/2021   ALKPHOS 89 02/21/2021   BILITOT 0.3 02/21/2021   Lab Results  Component Value  Date   HGBA1C 6.1 (H) 01/31/2021   HGBA1C 7.1 (H) 10/01/2020   HGBA1C 6.5 06/21/2018   HGBA1C 6.0 08/24/2016   HGBA1C 7.0 (H) 11/08/2015   Lab Results  Component Value Date   INSULIN 24.5 02/21/2021   INSULIN 19.5 10/28/2020   Lab Results  Component Value Date   TSH 1.400 10/28/2020   Lab Results  Component Value Date   CHOL 190 02/21/2021   HDL 50 02/21/2021   LDLCALC 118 (H) 02/21/2021   LDLDIRECT 169.3 06/28/2012   TRIG 122 02/21/2021   CHOLHDL 4 06/21/2018   Lab Results  Component Value Date   VD25OH 47.4 02/21/2021   VD25OH 23.3 (L) 10/28/2020   Lab Results  Component Value Date   WBC 8.4 09/16/2020   HGB 13.7 09/16/2020   HCT 41.7 09/16/2020   MCV 93.7 09/16/2020  PLT 247.0 09/16/2020   No results found for: IRON, TIBC, FERRITIN  Obesity Behavioral Intervention:   Approximately 15 minutes were spent on the discussion below.  ASK: We discussed the diagnosis of obesity with [name] today and Jahanna agreed to give [place] permission to discuss obesity behavioral modification therapy today.  ASSESS: Dalila has the diagnosis of obesity and her BMI today is 30.8. Catheline is in the action stage of change.   ADVISE: Bridgid was educated on the multiple health risks of obesity as well as the benefit of weight loss to improve her health. She was advised of the need for long term treatment and the importance of lifestyle modifications to improve her current health and to decrease her risk of future health problems.  AGREE: Multiple dietary modification options and treatment options were discussed and Eowyn agreed to follow the recommendations documented in the above note.  ARRANGE: Leathie was educated on the importance of frequent visits to treat obesity as outlined per CMS and USPSTF guidelines and agreed to schedule her next follow up appointment today.  Attestation Statements:   Reviewed by clinician on day of visit: allergies, medications, problem  list, medical history, surgical history, family history, social history, and previous encounter notes.   Wilhemena Durie, am acting as transcriptionist for [employer], DO.  I have reviewed the above documentation for accuracy and completeness, and I agree with the above. Marjory Sneddon, D.O.  The [place] was signed into law in 2016 which includes the topic of electronic health records.  This provides immediate access to information in MyChart.  This includes consultation notes, operative notes, office notes, lab results and pathology reports.  If you have any questions about what you read please let us know at your next visit so we can discuss your concerns and take corrective action if need be.  We are right here with you.

## 2021-02-27 ENCOUNTER — Encounter: Payer: Self-pay | Admitting: Family Medicine

## 2021-03-10 ENCOUNTER — Encounter (INDEPENDENT_AMBULATORY_CARE_PROVIDER_SITE_OTHER): Payer: Self-pay | Admitting: Family Medicine

## 2021-03-10 ENCOUNTER — Ambulatory Visit (INDEPENDENT_AMBULATORY_CARE_PROVIDER_SITE_OTHER): Payer: Medicare Other | Admitting: Family Medicine

## 2021-03-10 ENCOUNTER — Other Ambulatory Visit: Payer: Self-pay | Admitting: Family Medicine

## 2021-03-10 ENCOUNTER — Other Ambulatory Visit: Payer: Self-pay

## 2021-03-10 VITALS — BP 117/67 | HR 66 | Temp 97.6°F | Ht 68.0 in | Wt 200.0 lb

## 2021-03-10 DIAGNOSIS — Z7984 Long term (current) use of oral hypoglycemic drugs: Secondary | ICD-10-CM | POA: Diagnosis not present

## 2021-03-10 DIAGNOSIS — E785 Hyperlipidemia, unspecified: Secondary | ICD-10-CM | POA: Diagnosis not present

## 2021-03-10 DIAGNOSIS — E1169 Type 2 diabetes mellitus with other specified complication: Secondary | ICD-10-CM

## 2021-03-10 DIAGNOSIS — Z6830 Body mass index (BMI) 30.0-30.9, adult: Secondary | ICD-10-CM

## 2021-03-10 DIAGNOSIS — E559 Vitamin D deficiency, unspecified: Secondary | ICD-10-CM | POA: Diagnosis not present

## 2021-03-10 DIAGNOSIS — Z6833 Body mass index (BMI) 33.0-33.9, adult: Secondary | ICD-10-CM

## 2021-03-10 DIAGNOSIS — E669 Obesity, unspecified: Secondary | ICD-10-CM | POA: Diagnosis not present

## 2021-03-10 DIAGNOSIS — F320 Major depressive disorder, single episode, mild: Secondary | ICD-10-CM

## 2021-03-10 MED ORDER — VITAMIN D (ERGOCALCIFEROL) 1.25 MG (50000 UNIT) PO CAPS: 50000.0000 [IU] | ORAL_CAPSULE | ORAL | 0 refills | Status: DC

## 2021-03-14 ENCOUNTER — Other Ambulatory Visit (INDEPENDENT_AMBULATORY_CARE_PROVIDER_SITE_OTHER): Payer: Self-pay | Admitting: Family Medicine

## 2021-03-14 DIAGNOSIS — E1169 Type 2 diabetes mellitus with other specified complication: Secondary | ICD-10-CM

## 2021-03-14 NOTE — Progress Notes (Signed)
? ? ? ?Chief Complaint:  ? ?OBESITY ?Vickie Nichols is here to discuss her progress with her obesity treatment plan along with follow-up of her obesity related diagnoses. Vickie Nichols is on the Category 2 Plan with lunch options and states she is following her eating plan approximately 60% of the time. Vickie Nichols states she is not exercising regularly. ? ?Today's visit was #: 9 ?Starting weight: 219 lbs ?Starting date: 10/28/2020 ?Today's weight: 200 lbs ?Today's date: 03/10/2021 ?Total lbs lost to date: 19 lbs ?Total lbs lost since last in-office visit: 2 lbs ? ?Interim History: Vickie Nichols says she has been craving sugar (gummies) lately.  She denies issues with the meal plan.  She says it has been difficult to motivate lately. She endorses she does better when she is going to the gym regularly ? ?Her goal :  is to get back into the gym 3 days/week for 1 hour.  She will do strength training for 20 minutes or so as a goal in the future! ? ? ?Subjective:  ? ?1. Type 2 diabetes mellitus with other specified complication, without long-term current use of insulin ([place]) ?We increased Vickie Nichols's metformin to 1 full 500 mg tablet at last office visit.  She is tolerating it well.  She says it is helping some with cravings.  She is taking Januvia 100 mg daily as well. ? ?2. Hyperlipidemia associated with type 2 diabetes mellitus ([place]) ?Vickie Nichols was started on Crestor 2.5 mg at last office visit.  Vickie Nichols is tolerating medication(s) well without side effects.  Medication compliance is good and patient appears to be taking it as prescribed.  Denies additional concerns regarding this condition.  ? ?3. Vitamin D deficiency ?She is currently taking prescription vitamin D 50,000 IU each week. She denies nausea, vomiting or muscle weakness. ? ? ?Assessment/Plan:  ? ? ?Medications Discontinued During This Encounter  ?Medication Reason  ? Vitamin D, Ergocalciferol, (DRISDOL) 1.25 MG (50000 UNIT) CAPS capsule Reorder  ?  ? ?Meds ordered  this encounter  ?Medications  ? Vitamin D, Ergocalciferol, (DRISDOL) 1.25 MG (50000 UNIT) CAPS capsule  ?  Sig: Take 1 capsule (50,000 Units total) by mouth every 7 (seven) days.  ?  Dispense:  4 capsule  ?  Refill:  0  ?  30 d supply;  ** OV for RF **   Do not send RF request  ?  ? ?1. Type 2 diabetes mellitus with other specified complication, without long-term current use of insulin ([place]) ?Vickie Nichols declines medication adjustment today.  She will increase protein intake and make smarter snack choices. ? ?2. Hyperlipidemia associated with type 2 diabetes mellitus ([place]) ?She will increase water intake and movement in the near future.  Continue Crestor 1/2 tablet QHS.  Decrease saturated/trans fats and decrease fatty carbs. ? ?3. Vitamin D deficiency ?Low Vitamin D level contributes to fatigue and are associated with obesity, breast, and colon cancer. She agrees to continue to take prescription Vitamin D '@50'$ ,000 IU every week and will follow-up for routine testing of Vitamin D, at least 2-3 times per year to avoid over-replacement. ? ?- Refill Vitamin D, Ergocalciferol, (DRISDOL) 1.25 MG (50000 UNIT) CAPS capsule; Take 1 capsule (50,000 Units total) by mouth every 7 (seven) days.  Dispense: 4 capsule; Refill: 0 ? ?4. Obesity with current BMI of 30.4 ? ?Vickie Nichols is currently in the action stage of change. As such, her goal is to continue with weight loss efforts. She has agreed to the Category 2 Plan with lunch options  and protein equivalents.  ? ?Exercise goals: She desires to get back into the gym 3/ wk for 1 hour. She will add strength training for 20 minutes or so as a goal in the future. ? ?Behavioral modification strategies: decreasing simple carbohydrates and planning for success. ? ?Vickie Nichols has agreed to follow-up with our clinic in 2 weeks with Vickie Pacific, FNP then back with me.  She was informed of the importance of frequent follow-up visits to maximize her success with intensive lifestyle  modifications for her multiple health conditions.  ? ?Objective:  ? ?Blood pressure 117/67, pulse 66, temperature 97.6 ?F (36.4 ?C), temperature source Oral, height '5\' 8"'$  (1.727 m), weight 200 lb (90.7 kg), SpO2 96 %. ?Body mass index is 30.41 kg/m?. ? ?General: Cooperative, alert, well developed, in no acute distress. ?HEENT: Conjunctivae and lids unremarkable. ?Cardiovascular: Regular rhythm.  ?Lungs: Normal work of breathing. ?Neurologic: No focal deficits.  ? ?Lab Results  ?Component Value Date  ? CREATININE 0.93 02/21/2021  ? BUN 17 02/21/2021  ? NA 142 02/21/2021  ? K 4.6 02/21/2021  ? CL 101 02/21/2021  ? CO2 28 02/21/2021  ? ?Lab Results  ?Component Value Date  ? ALT 17 02/21/2021  ? AST 18 02/21/2021  ? ALKPHOS 89 02/21/2021  ? BILITOT 0.3 02/21/2021  ? ?Lab Results  ?Component Value Date  ? HGBA1C 6.1 (H) 01/31/2021  ? HGBA1C 7.1 (H) 10/01/2020  ? HGBA1C 6.5 06/21/2018  ? HGBA1C 6.0 08/24/2016  ? HGBA1C 7.0 (H) 11/08/2015  ? ?Lab Results  ?Component Value Date  ? INSULIN 24.5 02/21/2021  ? INSULIN 19.5 10/28/2020  ? ?Lab Results  ?Component Value Date  ? TSH 1.400 10/28/2020  ? ?Lab Results  ?Component Value Date  ? CHOL 190 02/21/2021  ? HDL 50 02/21/2021  ? LDLCALC 118 (H) 02/21/2021  ? LDLDIRECT 169.3 06/28/2012  ? TRIG 122 02/21/2021  ? CHOLHDL 4 06/21/2018  ? ?Lab Results  ?Component Value Date  ? VD25OH 47.4 02/21/2021  ? VD25OH 23.3 (L) 10/28/2020  ? ?Lab Results  ?Component Value Date  ? WBC 8.4 09/16/2020  ? HGB 13.7 09/16/2020  ? HCT 41.7 09/16/2020  ? MCV 93.7 09/16/2020  ? PLT 247.0 09/16/2020  ? ?Obesity Behavioral Intervention:  ? ?Approximately 15 minutes were spent on the discussion below. ? ?ASK: ?We discussed the diagnosis of obesity with Vickie Nichols today and Vickie Nichols agreed to give [place] permission to discuss obesity behavioral modification therapy today. ? ?ASSESS: ?Vickie Nichols has the diagnosis of obesity and her BMI today is 30.4. Vickie Nichols is in the action stage of change.   ? ?ADVISE: ?Vickie Nichols was educated on the multiple health risks of obesity as well as the benefit of weight loss to improve her health. She was advised of the need for long term treatment and the importance of lifestyle modifications to improve her current health and to decrease her risk of future health problems. ? ?AGREE: ?Multiple dietary modification options and treatment options were discussed and Vickie Nichols agreed to follow the recommendations documented in the above note. ? ?ARRANGE: ?Vickie Nichols was educated on the importance of frequent visits to treat obesity as outlined per CMS and USPSTF guidelines and agreed to schedule her next follow up appointment today. ? ?Attestation Statements:  ? ?Reviewed by clinician on day of visit: allergies, medications, problem list, medical history, surgical history, family history, social history, and previous encounter notes. ? ?I, Water quality scientist, CMA, am acting as transcriptionist for [employer], DO. ? ?I have reviewed the  above documentation for accuracy and completeness, and I agree with the above. Marjory Sneddon, D.O. ? ?The [place] was signed into law in 2016 which includes the topic of electronic health records.  This provides immediate access to information in MyChart.  This includes consultation notes, operative notes, office notes, lab results and pathology reports.  If you have any questions about what you read please let us know at your next visit so we can discuss your concerns and take corrective action if need be.  We are right here with you. ? ?

## 2021-03-15 NOTE — Telephone Encounter (Signed)
Dr.Opalski ?

## 2021-03-22 ENCOUNTER — Encounter (INDEPENDENT_AMBULATORY_CARE_PROVIDER_SITE_OTHER): Payer: Self-pay | Admitting: Nurse Practitioner

## 2021-03-22 ENCOUNTER — Ambulatory Visit (INDEPENDENT_AMBULATORY_CARE_PROVIDER_SITE_OTHER): Payer: Medicare Other | Admitting: Nurse Practitioner

## 2021-03-22 ENCOUNTER — Other Ambulatory Visit: Payer: Self-pay

## 2021-03-22 VITALS — BP 124/72 | HR 63 | Temp 98.1°F | Ht 68.0 in | Wt 203.0 lb

## 2021-03-22 DIAGNOSIS — Z6830 Body mass index (BMI) 30.0-30.9, adult: Secondary | ICD-10-CM

## 2021-03-22 DIAGNOSIS — Z7985 Long-term (current) use of injectable non-insulin antidiabetic drugs: Secondary | ICD-10-CM

## 2021-03-22 DIAGNOSIS — E669 Obesity, unspecified: Secondary | ICD-10-CM

## 2021-03-22 DIAGNOSIS — E559 Vitamin D deficiency, unspecified: Secondary | ICD-10-CM | POA: Diagnosis not present

## 2021-03-22 DIAGNOSIS — K760 Fatty (change of) liver, not elsewhere classified: Secondary | ICD-10-CM

## 2021-03-22 DIAGNOSIS — E1169 Type 2 diabetes mellitus with other specified complication: Secondary | ICD-10-CM | POA: Diagnosis not present

## 2021-03-22 DIAGNOSIS — E785 Hyperlipidemia, unspecified: Secondary | ICD-10-CM

## 2021-03-22 MED ORDER — ROSUVASTATIN CALCIUM 5 MG PO TABS: 2.5000 mg | ORAL_TABLET | Freq: Every day | ORAL | 0 refills | Status: DC

## 2021-03-22 MED ORDER — VITAMIN D (ERGOCALCIFEROL) 1.25 MG (50000 UNIT) PO CAPS
50000.0000 [IU] | ORAL_CAPSULE | ORAL | 0 refills | Status: DC
Start: 2021-03-22 — End: 2021-04-07

## 2021-03-22 NOTE — Progress Notes (Signed)
? ? ? ?Chief Complaint:  ? ?OBESITY ?Vickie Nichols is here to discuss her progress with her obesity treatment plan along with follow-up of her obesity related diagnoses. Vickie Nichols is on the Category 2 Plan with lunch options and protein equivalents and states she is following her eating plan approximately 60% of the time. Vickie Nichols states she is not exercising regularly. ? ?Today's visit was #: 10 ?Starting weight: 219 lbs ?Starting date: 10/28/2020 ?Today's weight: 203 lbs ?Today's date: 03/22/2021 ?Total lbs lost to date: 16 lbs ?Total lbs lost since last in-office visit: 0 ? ?Interim History: Vickie Nichols feels she has gotten off track and has not been exercising as she has in the past.  She says it is hard to get back on a routine since Covid.  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  Her daughter is a Therapist, sports and works 12 hour shifts.  She struggles with getting in her protein.  Breakfast - yogurt and apple.  Lunch - yogurt and/or sandwich (loves gummy bears).  Dinner - protein and vegetables.  She says she is eating more of what she is not supposed to eat.  Eats out of boredom. ? ?Subjective:  ? ?1. Type 2 diabetes mellitus with other specified complication, without long-term current use of insulin ([place]) ?She is taking metformin 500 mg and Januvia 100 mg.  Denies side effects.  She is on a statin. ? ?2. Hyperlipidemia associated with type 2 diabetes mellitus ([place]) ?Taking Crestor 2.5 mg.  Denies side effects.  Last LDL looked better, but not at goal. ? ?3. NAFLD (nonalcoholic fatty liver disease) ?Last CMP - AST and ALT - WNL.  Last US abdomen reviewed from 10/16/2020. ? ?4. Vitamin D deficiency ?Last vitamin D looked better.  Increased from 23.3 to 47.4.  She is currently taking prescription vitamin D 50,000 IU each week. She denies nausea, vomiting or muscle weakness. ? ?Assessment/Plan:  ? ?1. Type 2 diabetes mellitus with other specified complication, without long-term current use of insulin  ([place]) ?Good blood sugar control is important to decrease the likelihood of diabetic complications such as nephropathy, neuropathy, limb loss, blindness, coronary artery disease, and death. Intensive lifestyle modification including diet, exercise and weight loss are the first line of treatment for diabetes. Continue medications as directed. ? ?2. Hyperlipidemia associated with type 2 diabetes mellitus ([place]) ?Cardiovascular risk and specific lipid/LDL goals reviewed.  We discussed several lifestyle modifications today and Vickie Nichols will continue to work on diet, exercise and weight loss efforts. Continue Crestor as directed.  Will continue to monitor lipids and liver.  Refill Crestor 5 mg 1/2 tablet daily, as per below.  ? ?Counseling ?Intensive lifestyle modifications are the first line treatment for this issue. ?Dietary changes: Increase soluble fiber. Decrease simple carbohydrates. ?Exercise changes: Moderate to vigorous-intensity aerobic activity 150 minutes per week if tolerated. ?Lipid-lowering medications: see documented in medical record. ? ?- Refill rosuvastatin (CRESTOR) 5 MG tablet; Take 0.5 tablets (2.5 mg total) by mouth at bedtime.  Dispense: 30 tablet; Refill: 0 ? ?3. NAFLD (nonalcoholic fatty liver disease) ?Continue working on dietary changes, exercise, and weight loss.  Will continue to monitor liver enzymes. ? ?4. Vitamin D deficiency ?Low Vitamin D level contributes to fatigue and are associated with obesity, breast, and colon cancer. She agrees to continue to take prescription Vitamin D '@50'$ ,000 IU every week and will follow-up for routine testing of Vitamin D, at least 2-3 times per year to avoid over-replacement.  Will refill vitamin D 50,000  IU once weekly, as per below. ? ?- Refill Vitamin D, Ergocalciferol, (DRISDOL) 1.25 MG (50000 UNIT) CAPS capsule; Take 1 capsule (50,000 Units total) by mouth every 7 (seven) days.  Dispense: 4 capsule; Refill: 0 ? ?5. Obesity with current BMI of  30.4 ? ?Vickie Nichols is currently in the action stage of change. As such, her goal is to continue with weight loss efforts. She has agreed to the Category 2 Plan.  ? ?Exercise goals:  Start walking. ? ?Behavioral modification strategies: increasing lean protein intake, increasing water intake, no skipping meals, and meal planning and cooking strategies. ? ?Vickie Nichols has agreed to follow-up with our clinic in 2 weeks. She was informed of the importance of frequent follow-up visits to maximize her success with intensive lifestyle modifications for her multiple health conditions.  ? ?Objective:  ? ?Blood pressure 124/72, pulse 63, temperature 98.1 ?F (36.7 ?C), height '5\' 8"'$  (1.727 m), weight 203 lb (92.1 kg), SpO2 98 %. ?Body mass index is 30.87 kg/m?. ? ?General: Cooperative, alert, well developed, in no acute distress. ?HEENT: Conjunctivae and lids unremarkable. ?Cardiovascular: Regular rhythm.  ?Lungs: Normal work of breathing. ?Neurologic: No focal deficits.  ? ?Lab Results  ?Component Value Date  ? CREATININE 0.93 02/21/2021  ? BUN 17 02/21/2021  ? NA 142 02/21/2021  ? K 4.6 02/21/2021  ? CL 101 02/21/2021  ? CO2 28 02/21/2021  ? ?Lab Results  ?Component Value Date  ? ALT 17 02/21/2021  ? AST 18 02/21/2021  ? ALKPHOS 89 02/21/2021  ? BILITOT 0.3 02/21/2021  ? ?Lab Results  ?Component Value Date  ? HGBA1C 6.1 (H) 01/31/2021  ? HGBA1C 7.1 (H) 10/01/2020  ? HGBA1C 6.5 06/21/2018  ? HGBA1C 6.0 08/24/2016  ? HGBA1C 7.0 (H) 11/08/2015  ? ?Lab Results  ?Component Value Date  ? INSULIN 24.5 02/21/2021  ? INSULIN 19.5 10/28/2020  ? ?Lab Results  ?Component Value Date  ? TSH 1.400 10/28/2020  ? ?Lab Results  ?Component Value Date  ? CHOL 190 02/21/2021  ? HDL 50 02/21/2021  ? LDLCALC 118 (H) 02/21/2021  ? LDLDIRECT 169.3 06/28/2012  ? TRIG 122 02/21/2021  ? CHOLHDL 4 06/21/2018  ? ?Lab Results  ?Component Value Date  ? VD25OH 47.4 02/21/2021  ? VD25OH 23.3 (L) 10/28/2020  ? ?Lab Results  ?Component Value Date  ? WBC 8.4  09/16/2020  ? HGB 13.7 09/16/2020  ? HCT 41.7 09/16/2020  ? MCV 93.7 09/16/2020  ? PLT 247.0 09/16/2020  ? ?Obesity Behavioral Intervention:  ? ?Approximately 15 minutes were spent on the discussion below. ? ?ASK: ?We discussed the diagnosis of obesity with Vickie Nichols today and Vickie Nichols agreed to give [place] permission to discuss obesity behavioral modification therapy today. ? ?ASSESS: ?Vickie Nichols has the diagnosis of obesity and her BMI today is 30.9. Vickie Nichols is in the action stage of change.  ? ?ADVISE: ?Vickie Nichols was educated on the multiple health risks of obesity as well as the benefit of weight loss to improve her health. She was advised of the need for long term treatment and the importance of lifestyle modifications to improve her current health and to decrease her risk of future health problems. ? ?AGREE: ?Multiple dietary modification options and treatment options were discussed and Vickie Nichols agreed to follow the recommendations documented in the above note. ? ?ARRANGE: ?Vickie Nichols was educated on the importance of frequent visits to treat obesity as outlined per CMS and USPSTF guidelines and agreed to schedule her next follow up appointment today. ? ?Attestation Statements:  ? ?  Reviewed by clinician on day of visit: allergies, medications, problem list, medical history, surgical history, family history, social history, and previous encounter notes. ? ?I, Water quality scientist, CMA, am acting as transcriptionist for Everardo Pacific, [place]. ? ?I have reviewed the above documentation for accuracy and completeness, and I agree with the above. Everardo Pacific, FNP  ?

## 2021-04-07 ENCOUNTER — Other Ambulatory Visit (INDEPENDENT_AMBULATORY_CARE_PROVIDER_SITE_OTHER): Payer: Self-pay | Admitting: Family Medicine

## 2021-04-07 ENCOUNTER — Ambulatory Visit (INDEPENDENT_AMBULATORY_CARE_PROVIDER_SITE_OTHER): Payer: Medicare Other | Admitting: Family Medicine

## 2021-04-07 ENCOUNTER — Encounter (INDEPENDENT_AMBULATORY_CARE_PROVIDER_SITE_OTHER): Payer: Self-pay | Admitting: Family Medicine

## 2021-04-07 VITALS — BP 123/64 | HR 65 | Temp 98.2°F | Ht 68.0 in | Wt 196.0 lb

## 2021-04-07 DIAGNOSIS — R638 Other symptoms and signs concerning food and fluid intake: Secondary | ICD-10-CM | POA: Diagnosis not present

## 2021-04-07 DIAGNOSIS — Z7984 Long term (current) use of oral hypoglycemic drugs: Secondary | ICD-10-CM

## 2021-04-07 DIAGNOSIS — E559 Vitamin D deficiency, unspecified: Secondary | ICD-10-CM | POA: Diagnosis not present

## 2021-04-07 DIAGNOSIS — E1165 Type 2 diabetes mellitus with hyperglycemia: Secondary | ICD-10-CM

## 2021-04-07 DIAGNOSIS — Z6829 Body mass index (BMI) 29.0-29.9, adult: Secondary | ICD-10-CM

## 2021-04-07 DIAGNOSIS — E669 Obesity, unspecified: Secondary | ICD-10-CM

## 2021-04-07 MED ORDER — VITAMIN D (ERGOCALCIFEROL) 1.25 MG (50000 UNIT) PO CAPS: 50000.0000 [IU] | ORAL_CAPSULE | ORAL | 0 refills | Status: DC

## 2021-04-07 MED ORDER — METFORMIN HCL 500 MG PO TABS: ORAL_TABLET | ORAL | 0 refills | Status: DC

## 2021-04-09 ENCOUNTER — Other Ambulatory Visit (INDEPENDENT_AMBULATORY_CARE_PROVIDER_SITE_OTHER): Payer: Self-pay | Admitting: Family Medicine

## 2021-04-09 DIAGNOSIS — E559 Vitamin D deficiency, unspecified: Secondary | ICD-10-CM

## 2021-04-11 ENCOUNTER — Other Ambulatory Visit (INDEPENDENT_AMBULATORY_CARE_PROVIDER_SITE_OTHER): Payer: Self-pay | Admitting: Family Medicine

## 2021-04-11 DIAGNOSIS — E1165 Type 2 diabetes mellitus with hyperglycemia: Secondary | ICD-10-CM

## 2021-04-11 NOTE — Progress Notes (Signed)
? ? ? ?Chief Complaint:  ? ?OBESITY ?Vickie Nichols is here to discuss her progress with her obesity treatment plan along with follow-up of her obesity related diagnoses. Vickie Nichols is on the Category 2 Plan and states she is following her eating plan approximately 75-80% of the time. Vickie Nichols states she is walking 2 miles and walking on equipment for 20 minutes 3 times per week. ? ?Today's visit was #: 32 ?Starting weight: 219 lbs ?Starting date: 10/28/2020 ?Today's weight: 196 lbs ?Today's date: 04/07/2021 ?Total lbs lost to date: 32 ?Total lbs lost since last in-office visit: 7 ? ?Interim History: Vickie Nichols increased her activity, and she is walking more and started some resistance training. She states she increased her dinner protein as well. ? ?Subjective:  ? ?1. Type 2 diabetes mellitus with hyperglycemia, without long-term current use of insulin ([place]) ?Vickie Nichols is still with increased cravings after dinner. She is not checking her blood sugars at all. She notes she feels great! ? ?2. Vitamin D deficiency ?She is currently taking prescription vitamin D 50,000 IU each week. She denies nausea, vomiting or muscle weakness. ? ?3. Abnormal craving with emotional eating ?Vickie Nichols is still craving gummies after dinner. She notes it is not time hungry but its just a habit and like an obsession.  ? ?Assessment/Plan:  ?No orders of the defined types were placed in this encounter. ? ? ?Medications Discontinued During This Encounter  ?Medication Reason  ? metFORMIN (GLUCOPHAGE) 500 MG tablet Reorder  ? Vitamin D, Ergocalciferol, (DRISDOL) 1.25 MG (50000 UNIT) CAPS capsule Reorder  ?  ? ?Meds ordered this encounter  ?Medications  ? metFORMIN (GLUCOPHAGE) 500 MG tablet  ?  Sig: 1 tab with lunch and 1 with dinner  ?  Dispense:  60 tablet  ?  Refill:  0  ?  no RF until OV  ? Vitamin D, Ergocalciferol, (DRISDOL) 1.25 MG (50000 UNIT) CAPS capsule  ?  Sig: Take 1 capsule (50,000 Units total) by mouth every 7 (seven) days.  ?  Dispense:  4  capsule  ?  Refill:  0  ?  30 d supply;  ** OV for RF **   Do not send RF request  ?  ? ?1. Type 2 diabetes mellitus with hyperglycemia, without long-term current use of insulin ([place]) ?Vickie Nichols agreed to increase metformin to 500 mg BID (up from once daily), with lunch and with dinner. Counseled patient on pathophysiology of disease and discussed how good blood sugar control is important to decrease the risk of diabetic complications such as nephropathy, neuropathy, limb loss, blindness, coronary artery disease, etc.   ?Intensive lifestyle modification including diet, exercise and weight loss are the first line of treatment for diabetes. We extensively discussed the importance of decreasing simple carbs and how certain foods they eat will affect their blood sugars ?Reminded Vickie Nichols if she feels poorly- check Blood Sugar and Blood Pressure at that time.  Bring in BS and BP logs each OV.   Eat on a regular basis- no skipping or going long periods without eating.  We discussed hypoglycemia prevention.   Any concerns about medicines should be directed at the prescribing provider ?- Recheck labs in 3 months if not done at Endo provider / PCP.  ?- Importance of f/up with PCP and all other specialists, as scheduled, was stressed to the patient today  ? ?- metFORMIN (GLUCOPHAGE) 500 MG tablet; 1 tab with lunch and 1 with dinner  Dispense: 60 tablet; Refill: 0 ? ?2. Vitamin  D deficiency ?- I again reiterated the importance of vitamin D (as well as calcium) to their health and wellbeing.  ?- I reviewed possible symptoms of low Vitamin D:  low energy, depressed mood, muscle aches, joint aches, osteoporosis etc. ?- low Vitamin D levels may be linked to an increased risk of cardiovascular events and even increased risk of cancers- such as colon and breast.  ?- ideal vitamin D levels reviewed with patient  ?- I recommend pt take a  weekly prescription vit D - see script below   ?- Informed patient this may be a lifelong  thing, and she was encouraged to continue to take the medicine until told otherwise.    ?- weight loss will likely improve availability of vitamin D, thus encouraged Vickie Nichols to continue with meal plan and their weight loss efforts to further improve this condition.  Thus, we will need to monitor levels regularly (every 3-4 mo on average) to keep levels within normal limits and prevent over supplementation. ?- pt's questions and concerns regarding this condition addressed. ? ?- Vitamin D, Ergocalciferol, (DRISDOL) 1.25 MG (50000 UNIT) CAPS capsule; Take 1 capsule (50,000 Units total) by mouth every 7 (seven) days.  Dispense: 4 capsule; Refill: 0 ? ?3. Abnormal craving with emotional eating ?Vickie Nichols will increase her protein at dinner. Healthier snack options were discussed with the patient with more protein and fiber. We will increase metformin dose. Referral to Dr. Mallie Mussel was declined at this time.  ? ?4. Obesity with current BMI of 29.8 ?Vickie Nichols is currently in the action stage of change. As such, her goal is to continue with weight loss efforts. She has agreed to the Category 2 Plan with 8/10 oz of protein at dinner.  ? ?Exercise goals: For substantial health benefits, adults should do at least 150 minutes (2 hours and 30 minutes) a week of moderate-intensity, or 75 minutes (1 hour and 15 minutes) a week of vigorous-intensity aerobic physical activity, or an equivalent combination of moderate- and vigorous-intensity aerobic activity. Aerobic activity should be performed in episodes of at least 10 minutes, and preferably, it should be spread throughout the week. ? ?Behavioral modification strategies: better snacking choices and emotional eating strategies. ? ?Deem has agreed to follow-up with our clinic in 3 to 4 weeks. She was informed of the importance of frequent follow-up visits to maximize her success with intensive lifestyle modifications for her multiple health conditions.  ? ?Objective:  ? ?Blood  pressure 123/64, pulse 65, temperature 98.2 ?F (36.8 ?C), height '5\' 8"'$  (1.727 m), weight 196 lb (88.9 kg), SpO2 99 %. ?Body mass index is 29.8 kg/m?. ? ?General: Cooperative, alert, well developed, in no acute distress. ?HEENT: Conjunctivae and lids unremarkable. ?Cardiovascular: Regular rhythm.  ?Lungs: Normal work of breathing. ?Neurologic: No focal deficits.  ? ?Lab Results  ?Component Value Date  ? CREATININE 0.93 02/21/2021  ? BUN 17 02/21/2021  ? NA 142 02/21/2021  ? K 4.6 02/21/2021  ? CL 101 02/21/2021  ? CO2 28 02/21/2021  ? ?Lab Results  ?Component Value Date  ? ALT 17 02/21/2021  ? AST 18 02/21/2021  ? ALKPHOS 89 02/21/2021  ? BILITOT 0.3 02/21/2021  ? ?Lab Results  ?Component Value Date  ? HGBA1C 6.1 (H) 01/31/2021  ? HGBA1C 7.1 (H) 10/01/2020  ? HGBA1C 6.5 06/21/2018  ? HGBA1C 6.0 08/24/2016  ? HGBA1C 7.0 (H) 11/08/2015  ? ?Lab Results  ?Component Value Date  ? INSULIN 24.5 02/21/2021  ? INSULIN 19.5 10/28/2020  ? ?Lab  Results  ?Component Value Date  ? TSH 1.400 10/28/2020  ? ?Lab Results  ?Component Value Date  ? CHOL 190 02/21/2021  ? HDL 50 02/21/2021  ? LDLCALC 118 (H) 02/21/2021  ? LDLDIRECT 169.3 06/28/2012  ? TRIG 122 02/21/2021  ? CHOLHDL 4 06/21/2018  ? ?Lab Results  ?Component Value Date  ? VD25OH 47.4 02/21/2021  ? VD25OH 23.3 (L) 10/28/2020  ? ?Lab Results  ?Component Value Date  ? WBC 8.4 09/16/2020  ? HGB 13.7 09/16/2020  ? HCT 41.7 09/16/2020  ? MCV 93.7 09/16/2020  ? PLT 247.0 09/16/2020  ? ?No results found for: IRON, TIBC, FERRITIN ? ?Obesity Behavioral Intervention:  ? ?Approximately 15 minutes were spent on the discussion below. ? ?ASK: ?We discussed the diagnosis of obesity with Vickie Nichols today and Vickie Nichols agreed to give [place] permission to discuss obesity behavioral modification therapy today. ? ?ASSESS: ?Vickie Nichols has the diagnosis of obesity and her BMI today is 29.8. Vickie Nichols is in the action stage of change.  ? ?ADVISE: ?Jailyn was educated on the multiple health risks of obesity  as well as the benefit of weight loss to improve her health. She was advised of the need for long term treatment and the importance of lifestyle modifications to improve her current health and to decr

## 2021-04-15 ENCOUNTER — Encounter: Payer: Self-pay | Admitting: Family Medicine

## 2021-04-15 DIAGNOSIS — E1165 Type 2 diabetes mellitus with hyperglycemia: Secondary | ICD-10-CM

## 2021-04-18 MED ORDER — METFORMIN HCL 1000 MG PO TABS: 1000.0000 mg | ORAL_TABLET | Freq: Two times a day (BID) | ORAL | 1 refills | Status: DC

## 2021-04-19 ENCOUNTER — Ambulatory Visit (INDEPENDENT_AMBULATORY_CARE_PROVIDER_SITE_OTHER): Payer: Medicare Other | Admitting: Family Medicine

## 2021-04-19 ENCOUNTER — Encounter (INDEPENDENT_AMBULATORY_CARE_PROVIDER_SITE_OTHER): Payer: Self-pay | Admitting: Family Medicine

## 2021-04-19 VITALS — BP 118/67 | HR 62 | Temp 98.3°F | Ht 68.0 in | Wt 196.0 lb

## 2021-04-19 DIAGNOSIS — E669 Obesity, unspecified: Secondary | ICD-10-CM | POA: Diagnosis not present

## 2021-04-19 DIAGNOSIS — E1169 Type 2 diabetes mellitus with other specified complication: Secondary | ICD-10-CM | POA: Diagnosis not present

## 2021-04-19 DIAGNOSIS — E559 Vitamin D deficiency, unspecified: Secondary | ICD-10-CM

## 2021-04-19 DIAGNOSIS — Z6829 Body mass index (BMI) 29.0-29.9, adult: Secondary | ICD-10-CM | POA: Diagnosis not present

## 2021-04-19 DIAGNOSIS — Z7984 Long term (current) use of oral hypoglycemic drugs: Secondary | ICD-10-CM

## 2021-04-19 MED ORDER — VITAMIN D (ERGOCALCIFEROL) 1.25 MG (50000 UNIT) PO CAPS: 50000.0000 [IU] | ORAL_CAPSULE | ORAL | 0 refills | Status: DC

## 2021-04-21 ENCOUNTER — Other Ambulatory Visit (INDEPENDENT_AMBULATORY_CARE_PROVIDER_SITE_OTHER): Payer: Self-pay | Admitting: Family Medicine

## 2021-04-21 DIAGNOSIS — E559 Vitamin D deficiency, unspecified: Secondary | ICD-10-CM

## 2021-04-25 ENCOUNTER — Encounter: Payer: Self-pay | Admitting: Family Medicine

## 2021-04-25 ENCOUNTER — Other Ambulatory Visit (INDEPENDENT_AMBULATORY_CARE_PROVIDER_SITE_OTHER): Payer: Self-pay | Admitting: Nurse Practitioner

## 2021-04-25 ENCOUNTER — Encounter (INDEPENDENT_AMBULATORY_CARE_PROVIDER_SITE_OTHER): Payer: Self-pay | Admitting: Family Medicine

## 2021-04-25 DIAGNOSIS — E1169 Type 2 diabetes mellitus with other specified complication: Secondary | ICD-10-CM

## 2021-04-25 NOTE — Telephone Encounter (Signed)
Should contact PCP for RF ?

## 2021-04-25 NOTE — Telephone Encounter (Signed)
LAST APPOINTMENT DATE: 04/19/21 ?NEXT APPOINTMENT DATE: 05/09/21 ? ? ?[id number] [address], [place] AT [address] ?[address] ?[place] [address] ?Phone: [phone] Fax: [phone] ? ?Patient is requesting a refill of the following medications: ?Pending Prescriptions:                       Disp   Refills ?  rosuvastatin (CRESTOR) 5 MG tablet         30 tab*0       ?Sig: Take 0.5 tablets (2.5 mg total) by mouth at bedtime. ? ? ?Date last filled: 03/22/21 ?Previously prescribed by Stephganie ? ?Lab Results ?     Component                Value               Date                 ?     HGBA1C                   6.1 (H)             01/31/2021           ?     HGBA1C                   7.1 (H)             10/01/2020           ?     HGBA1C                   6.5                 06/21/2018           ?Lab Results ?     Component                Value               Date                 ?     MICROALBUR               2.1 (H)             10/01/2020           ?     LDLCALC                  118 (H)             02/21/2021           ?     CREATININE               0.93                02/21/2021           ?Lab Results ?     Component                Value               Date                 ?     [place]  47.4                02/21/2021           ?     VD25OH                   23.3 (L)            10/28/2020           ? ?BP Readings from Last 3 Encounters: ?04/19/21 : 118/67 ?04/07/21 : 123/64 ?03/22/21 : 124/72 ?

## 2021-04-26 ENCOUNTER — Encounter: Payer: Self-pay | Admitting: Family Medicine

## 2021-04-26 DIAGNOSIS — E1169 Type 2 diabetes mellitus with other specified complication: Secondary | ICD-10-CM

## 2021-04-26 MED ORDER — ROSUVASTATIN CALCIUM 5 MG PO TABS: 2.5000 mg | ORAL_TABLET | Freq: Every day | ORAL | 2 refills | Status: DC

## 2021-05-03 NOTE — Progress Notes (Signed)
? ? ? ?Chief Complaint:  ? ?OBESITY ?Vickie Nichols is here to discuss her progress with her obesity treatment plan along with follow-up of her obesity related diagnoses. Vickie Nichols is on the Category 2 Plan with 8-10 oz of protein at dinner and states she is following her eating plan approximately 75% of the time. Vickie Nichols states she is not currently exercising. ? ?Today's visit was #: 12 ?Starting weight: 219 lbs ?Starting date: 10/28/2020 ?Today's weight: 196 lbs ?Today's date: 04/19/2021 ?Total lbs lost to date: 33 ?Total lbs lost since last in-office visit: 0 ? ?Interim History: Vickie Nichols didn't gain weight over Easter and she is happy with that. She does feel bad about not having exercised the past few weeks though.  ? ?Subjective:  ? ?1. Type 2 diabetes mellitus with other specified complication, without long-term current use of insulin ([place]) ?Vickie Nichols's PCP increased metformin to 1,000 mg BID. She is off Januvia because she cant afford it. Her fasting blood sugars are stable between 90-120. ? ?2. Vitamin D deficiency ?Vickie Nichols is currently taking prescription vitamin D 50,000 IU each week. She denies nausea, vomiting or muscle weakness. ? ?Assessment/Plan:  ?No orders of the defined types were placed in this encounter. ? ? ?Medications Discontinued During This Encounter  ?Medication Reason  ? Vitamin D, Ergocalciferol, (DRISDOL) 1.25 MG (50000 UNIT) CAPS capsule Reorder  ?  ? ?Meds ordered this encounter  ?Medications  ? DISCONTD: Vitamin D, Ergocalciferol, (DRISDOL) 1.25 MG (50000 UNIT) CAPS capsule  ?  Sig: Take 1 capsule (50,000 Units total) by mouth every 7 (seven) days.  ?  Dispense:  4 capsule  ?  Refill:  0  ?  30 d supply;  ** OV for RF **   Do not send RF request  ?  ? ?1. Type 2 diabetes mellitus with other specified complication, without long-term current use of insulin ([place]) ?Levina desires less medications if possible. She will continue her prudent nutritional plan, increase exercise, and weight  loss. ? ?2. Vitamin D deficiency ?We will refill prescription Vitamin D for 1 month. [name] was reminded on the importance of bone density exams every 2 years, and weight bearing exercise. ? ?- Vitamin D, Ergocalciferol, (DRISDOL) 1.25 MG (50000 UNIT) CAPS capsule; Take 1 capsule (50,000 Units total) by mouth every 7 (seven) days.  Dispense: 4 capsule; Refill: 0 ? ?3. Obesity with current BMI of 29.8 ?Vickie Nichols is currently in the action stage of change. As such, her goal is to continue with weight loss efforts. She has agreed to the Category 2 Plan 8-10 oz at dinner.  ? ?Vickie Nichols plans to get back into the gym and will "read" her favorite audio books while working out. Rec: Atomic Habits. We discussed creating healthy habits to get her back on track.  ? ?Exercise goals: For substantial health benefits, adults should do at least 150 minutes (2 hours and 30 minutes) a week of moderate-intensity, or 75 minutes (1 hour and 15 minutes) a week of vigorous-intensity aerobic physical activity, or an equivalent combination of moderate- and vigorous-intensity aerobic activity. Aerobic activity should be performed in episodes of at least 10 minutes, and preferably, it should be spread throughout the week. ? ?Behavioral modification strategies: meal planning and cooking strategies, avoiding temptations, and planning for success. ? ?Vickie Nichols has agreed to follow-up with our clinic in 3 weeks. She was informed of the importance of frequent follow-up visits to maximize her success with intensive lifestyle modifications for her multiple health conditions.  ? ?Objective:  ? ?  Blood pressure 118/67, pulse 62, temperature 98.3 ?F (36.8 ?C), height '5\' 8"'$  (1.727 m), weight 196 lb (88.9 kg), SpO2 99 %. ?Body mass index is 29.8 kg/m?. ? ?General: Cooperative, alert, well developed, in no acute distress. ?HEENT: Conjunctivae and lids unremarkable. ?Cardiovascular: Regular rhythm.  ?Lungs: Normal work of breathing. ?Neurologic: No focal  deficits.  ? ?Lab Results  ?Component Value Date  ? CREATININE 0.93 02/21/2021  ? BUN 17 02/21/2021  ? NA 142 02/21/2021  ? K 4.6 02/21/2021  ? CL 101 02/21/2021  ? CO2 28 02/21/2021  ? ?Lab Results  ?Component Value Date  ? ALT 17 02/21/2021  ? AST 18 02/21/2021  ? ALKPHOS 89 02/21/2021  ? BILITOT 0.3 02/21/2021  ? ?Lab Results  ?Component Value Date  ? HGBA1C 6.1 (H) 01/31/2021  ? HGBA1C 7.1 (H) 10/01/2020  ? HGBA1C 6.5 06/21/2018  ? HGBA1C 6.0 08/24/2016  ? HGBA1C 7.0 (H) 11/08/2015  ? ?Lab Results  ?Component Value Date  ? INSULIN 24.5 02/21/2021  ? INSULIN 19.5 10/28/2020  ? ?Lab Results  ?Component Value Date  ? TSH 1.400 10/28/2020  ? ?Lab Results  ?Component Value Date  ? CHOL 190 02/21/2021  ? HDL 50 02/21/2021  ? LDLCALC 118 (H) 02/21/2021  ? LDLDIRECT 169.3 06/28/2012  ? TRIG 122 02/21/2021  ? CHOLHDL 4 06/21/2018  ? ?Lab Results  ?Component Value Date  ? VD25OH 47.4 02/21/2021  ? VD25OH 23.3 (L) 10/28/2020  ? ?Lab Results  ?Component Value Date  ? WBC 8.4 09/16/2020  ? HGB 13.7 09/16/2020  ? HCT 41.7 09/16/2020  ? MCV 93.7 09/16/2020  ? PLT 247.0 09/16/2020  ? ?No results found for: IRON, TIBC, FERRITIN ? ?Obesity Behavioral Intervention:  ? ?Approximately 15 minutes were spent on the discussion below. ? ?ASK: ?We discussed the diagnosis of obesity with Vickie Nichols today and Vickie Nichols agreed to give [place] permission to discuss obesity behavioral modification therapy today. ? ?ASSESS: ?[name] has the diagnosis of obesity and her BMI today is 29.8. Vickie Nichols is in the action stage of change.  ? ?ADVISE: ?Vickie Nichols was educated on the multiple health risks of obesity as well as the benefit of weight loss to improve her health. She was advised of the need for long term treatment and the importance of lifestyle modifications to improve her current health and to decrease her risk of future health problems. ? ?AGREE: ?Multiple dietary modification options and treatment options were discussed and Vickie Nichols agreed to  follow the recommendations documented in the above note. ? ?ARRANGE: ?[name] was educated on the importance of frequent visits to treat obesity as outlined per CMS and USPSTF guidelines and agreed to schedule her next follow up appointment today. ? ?Attestation Statements:  ? ?Reviewed by clinician on day of visit: allergies, medications, problem list, medical history, surgical history, family history, social history, and previous encounter notes. ? ? ?I, Trixie Dredge, am acting as transcriptionist for [employer], DO. ? ?I have reviewed the above documentation for accuracy and completeness, and I agree with the above. Marjory Sneddon, D.O. ? ?The [place] was signed into law in 2016 which includes the topic of electronic health records.  This provides immediate access to information in MyChart.  This includes consultation notes, operative notes, office notes, lab results and pathology reports.  If you have any questions about what you read please let us know at your next visit so we can discuss your concerns and take corrective action if need  be.  We are right here with you. ? ? ?

## 2021-05-09 ENCOUNTER — Encounter (INDEPENDENT_AMBULATORY_CARE_PROVIDER_SITE_OTHER): Payer: Self-pay | Admitting: Nurse Practitioner

## 2021-05-09 ENCOUNTER — Ambulatory Visit (INDEPENDENT_AMBULATORY_CARE_PROVIDER_SITE_OTHER): Payer: Medicare Other | Admitting: Nurse Practitioner

## 2021-05-09 VITALS — BP 108/65 | HR 70 | Temp 98.1°F | Ht 68.0 in | Wt 192.0 lb

## 2021-05-09 DIAGNOSIS — Z6829 Body mass index (BMI) 29.0-29.9, adult: Secondary | ICD-10-CM | POA: Diagnosis not present

## 2021-05-09 DIAGNOSIS — E559 Vitamin D deficiency, unspecified: Secondary | ICD-10-CM

## 2021-05-09 DIAGNOSIS — Z7984 Long term (current) use of oral hypoglycemic drugs: Secondary | ICD-10-CM

## 2021-05-09 DIAGNOSIS — E669 Obesity, unspecified: Secondary | ICD-10-CM | POA: Diagnosis not present

## 2021-05-09 DIAGNOSIS — E1169 Type 2 diabetes mellitus with other specified complication: Secondary | ICD-10-CM

## 2021-05-09 MED ORDER — VITAMIN D (ERGOCALCIFEROL) 1.25 MG (50000 UNIT) PO CAPS: 50000.0000 [IU] | ORAL_CAPSULE | ORAL | 0 refills | Status: DC

## 2021-05-09 NOTE — Progress Notes (Signed)
? ? ? ?Chief Complaint:  ? ?OBESITY ?Vickie Nichols is here to discuss her progress with her obesity treatment plan along with follow-up of her obesity related diagnoses. Vickie Nichols is on the Category 2 Plan with 8-10 ounce of protein at dinner and states she is following her eating plan approximately 65% of the time. Vickie Nichols states she is doing yardwork for 90 minutes 2 times per week. ? ?Today's visit was #: 13 ?Starting weight: 219 lbs ?Starting date: 10/28/2020 ?Today's weight: 192 lbs ?Today's date: 05/09/2021 ?Total lbs lost to date: 27 lbs ?Total lbs lost since last in-office visit: 4 lbs ? ?Interim History: Vickie Nichols has done well with weight loss since her last visit. Since her last visit she went to a wedding. Her highest weight was 230 lbs. She denies hunger and cravings. She is going to the beach in 3 weeks and going to [place] for Mother's Day weekend.  ? ?Subjective:  ? ?1. Type 2 diabetes mellitus with other specified complication, without long-term current use of insulin (Vickie Nichols) ?Vickie Nichols's last A1C was 6.1. She is taking Metformin 1000 mg twice daily. She denies side effects. She is on a statin. She is not checking fasting blood sugar at home. She is off Januvia due to cost.  ? ?2. Vitamin D deficiency ?Vickie Nichols's Vitamin D looked better at last check. She is taking Vitamin D 50,000 IU weekly. She denies side effects. She denies nausea, vomiting, and muscle weakness.  ? ?Assessment/Plan:  ? ?1. Type 2 diabetes mellitus with other specified complication, without long-term current use of insulin (Vickie Nichols) ?Good blood sugar control is important to decrease the likelihood of diabetic complications such as nephropathy, neuropathy, limb loss, blindness, coronary artery disease, and death. Intensive lifestyle modification including diet, exercise and weight loss are the first line of treatment for diabetes.  ? ?2. Vitamin D deficiency ?Low Vitamin D level contributes to fatigue and are associated with obesity, breast,  and colon cancer. We will refill prescription Vitamin D 50,000 IU every week for 1 month with no refills and Vickie Nichols will follow-up for routine testing of Vitamin D, at least 2-3 times per year to avoid over-replacement. ? ?- Vitamin D, Ergocalciferol, (DRISDOL) 1.25 MG (50000 UNIT) CAPS capsule; Take 1 capsule (50,000 Units total) by mouth every 7 (seven) days.  Dispense: 4 capsule; Refill: 0 ? ?3. Obesity with current BMI of 29.3 ?Vickie Nichols is currently in the action stage of change. As such, her goal is to continue with weight loss efforts. She has agreed to the Category 2 Plan.  ? ?Exercise goals:  As is.  ? ?Behavioral modification strategies: increasing lean protein intake, increasing water intake, no skipping meals, and meal planning and cooking strategies. ? ?Vickie Nichols has agreed to follow-up with our clinic in 3 weeks. She was informed of the importance of frequent follow-up visits to maximize her success with intensive lifestyle modifications for her multiple health conditions.  ? ?Objective:  ? ?Blood pressure 108/65, pulse 70, temperature 98.1 ?F (36.7 ?C), height '5\' 8"'$  (1.727 m), weight 192 lb (87.1 kg), SpO2 97 %. ?Body mass index is 29.19 kg/m?. ? ?General: Cooperative, alert, well developed, in no acute distress. ?HEENT: Conjunctivae and lids unremarkable. ?Cardiovascular: Regular rhythm.  ?Lungs: Normal work of breathing. ?Neurologic: No focal deficits.  ? ?Lab Results  ?Component Value Date  ? CREATININE 0.93 02/21/2021  ? BUN 17 02/21/2021  ? NA 142 02/21/2021  ? K 4.6 02/21/2021  ? CL 101 02/21/2021  ? CO2 28 02/21/2021  ? ?Lab  Results  ?Component Value Date  ? ALT 17 02/21/2021  ? AST 18 02/21/2021  ? ALKPHOS 89 02/21/2021  ? BILITOT 0.3 02/21/2021  ? ?Lab Results  ?Component Value Date  ? HGBA1C 6.1 (H) 01/31/2021  ? HGBA1C 7.1 (H) 10/01/2020  ? HGBA1C 6.5 06/21/2018  ? HGBA1C 6.0 08/24/2016  ? HGBA1C 7.0 (H) 11/08/2015  ? ?Lab Results  ?Component Value Date  ? INSULIN 24.5 02/21/2021  ? INSULIN  19.5 10/28/2020  ? ?Lab Results  ?Component Value Date  ? TSH 1.400 10/28/2020  ? ?Lab Results  ?Component Value Date  ? CHOL 190 02/21/2021  ? HDL 50 02/21/2021  ? LDLCALC 118 (H) 02/21/2021  ? LDLDIRECT 169.3 06/28/2012  ? TRIG 122 02/21/2021  ? CHOLHDL 4 06/21/2018  ? ?Lab Results  ?Component Value Date  ? VD25OH 47.4 02/21/2021  ? VD25OH 23.3 (L) 10/28/2020  ? ?Lab Results  ?Component Value Date  ? WBC 8.4 09/16/2020  ? HGB 13.7 09/16/2020  ? HCT 41.7 09/16/2020  ? MCV 93.7 09/16/2020  ? PLT 247.0 09/16/2020  ? ?No results found for: IRON, TIBC, FERRITIN ? ?Attestation Statements:  ? ?Reviewed by clinician on day of visit: allergies, medications, problem list, medical history, surgical history, family history, social history, and previous encounter notes. ? ?I, Lizbeth Bark, RMA, am acting as Location manager for Everardo Pacific, FNP. ? ?I have reviewed the above documentation for accuracy and completeness, and I agree with the above. Everardo Pacific, FNP ? ?

## 2021-05-18 ENCOUNTER — Other Ambulatory Visit (INDEPENDENT_AMBULATORY_CARE_PROVIDER_SITE_OTHER): Payer: Self-pay | Admitting: Family Medicine

## 2021-05-18 DIAGNOSIS — E1169 Type 2 diabetes mellitus with other specified complication: Secondary | ICD-10-CM

## 2021-05-31 ENCOUNTER — Other Ambulatory Visit: Payer: Self-pay | Admitting: Family Medicine

## 2021-05-31 ENCOUNTER — Encounter (INDEPENDENT_AMBULATORY_CARE_PROVIDER_SITE_OTHER): Payer: Self-pay | Admitting: Family Medicine

## 2021-05-31 ENCOUNTER — Ambulatory Visit (INDEPENDENT_AMBULATORY_CARE_PROVIDER_SITE_OTHER): Payer: Medicare Other | Admitting: Family Medicine

## 2021-05-31 VITALS — BP 137/70 | HR 60 | Temp 98.1°F | Ht 68.0 in | Wt 197.0 lb

## 2021-05-31 DIAGNOSIS — Z6830 Body mass index (BMI) 30.0-30.9, adult: Secondary | ICD-10-CM | POA: Diagnosis not present

## 2021-05-31 DIAGNOSIS — E559 Vitamin D deficiency, unspecified: Secondary | ICD-10-CM | POA: Diagnosis not present

## 2021-05-31 DIAGNOSIS — Z7984 Long term (current) use of oral hypoglycemic drugs: Secondary | ICD-10-CM

## 2021-05-31 DIAGNOSIS — F320 Major depressive disorder, single episode, mild: Secondary | ICD-10-CM

## 2021-05-31 DIAGNOSIS — E669 Obesity, unspecified: Secondary | ICD-10-CM

## 2021-05-31 DIAGNOSIS — Z79899 Other long term (current) drug therapy: Secondary | ICD-10-CM | POA: Insufficient documentation

## 2021-05-31 MED ORDER — VITAMIN D (ERGOCALCIFEROL) 1.25 MG (50000 UNIT) PO CAPS: 50000.0000 [IU] | ORAL_CAPSULE | ORAL | 0 refills | Status: DC

## 2021-06-04 NOTE — Progress Notes (Signed)
Chief Complaint:   OBESITY Vickie Nichols is here to discuss her progress with her obesity treatment plan along with follow-up of her obesity related diagnoses. Vickie Nichols is on the Category 2 Plan and states she is following her eating plan approximately 30% of the time. Vickie Nichols states she is not exercising.  Today's visit was #: 14 Starting weight: 219 lbs Starting date: 192 lbs Today's weight: 197 lbs Today's date: 05/31/2021 Total lbs lost to date: 22 lbs Total lbs lost since last in-office visit: 0  Interim History: Vickie Nichols voices she fell off the wagon with Mother's Day.  She indulged in things she had not had in a long time.  She has an upcoming beach trip in one week.  She has been cooking more and will exercise more.    Subjective:   1. Vitamin D deficiency Vickie Nichols is tolerating medication(s) well without side effects.  Medication compliance is good as patient endorses taking it as prescribed.  The patient denies additional concerns regarding this condition.       2. High risk medication use- metformin Vickie Nichols is on Metformin for 3-5 months now  Has been tolerating it well.  She has good control of hunger and cravings.  Vickie Nichols declines change in medications.    Assessment/Plan:  No orders of the defined types were placed in this encounter.   Medications Discontinued During This Encounter  Medication Reason   Vitamin D, Ergocalciferol, (DRISDOL) 1.25 MG (50000 UNIT) CAPS capsule Reorder     Meds ordered this encounter  Medications   Vitamin D, Ergocalciferol, (DRISDOL) 1.25 MG (50000 UNIT) CAPS capsule    Sig: Take 1 capsule (50,000 Units total) by mouth every 7 (seven) days.    Dispense:  4 capsule    Refill:  0    30 d supply;  ** OV for RF **   Do not send RF request     1. Vitamin D deficiency Refill Ergocalciferol, see below.  [place] was reminded of 2 year bone density exam starting at age 50.   - Vitamin D, Ergocalciferol, (DRISDOL) 1.25 MG  (50000 UNIT) CAPS capsule; Take 1 capsule (50,000 Units total) by mouth every 7 (seven) days.  Dispense: 4 capsule; Refill: 0  2. High risk medication use- metformin Check Vitamin B12 and Magnesium at next office visit with labs.   3. Obesity with current BMI of 30 Vickie Nichols's goal is to go to the gym 4 days a week, for at least one hour, as patient realizes this really helps her with weight loss.   Vickie Nichols is currently in the action stage of change. As such, her goal is to continue with weight loss efforts. She has agreed to the Category 2 Plan.   Exercise goals: For substantial health benefits, adults should do at least 150 minutes (2 hours and 30 minutes) a week of moderate-intensity, or 75 minutes (1 hour and 15 minutes) a week of vigorous-intensity aerobic physical activity, or an equivalent combination of moderate- and vigorous-intensity aerobic activity. Aerobic activity should be performed in episodes of at least 10 minutes, and preferably, it should be spread throughout the week.  Behavioral modification strategies: travel eating strategies.  Vickie Nichols has agreed to follow-up with our clinic in 2-3 weeks for fasting blood work.  She was informed of the importance of frequent follow-up visits to maximize her success with intensive lifestyle modifications for her multiple health conditions.   Objective:   Blood pressure 137/70, pulse 60, temperature 98.1 F (36.7  C), height '5\' 8"'$  (1.727 m), weight 197 lb (89.4 kg), SpO2 97 %. Body mass index is 29.95 kg/m.  General: Cooperative, alert, well developed, in no acute distress. HEENT: Conjunctivae and lids unremarkable. Cardiovascular: Regular rhythm.  Lungs: Normal work of breathing. Neurologic: No focal deficits.   Lab Results  Component Value Date   CREATININE 0.93 02/21/2021   BUN 17 02/21/2021   NA 142 02/21/2021   K 4.6 02/21/2021   CL 101 02/21/2021   CO2 28 02/21/2021   Lab Results  Component Value Date   ALT 17  02/21/2021   AST 18 02/21/2021   ALKPHOS 89 02/21/2021   BILITOT 0.3 02/21/2021   Lab Results  Component Value Date   HGBA1C 6.1 (H) 01/31/2021   HGBA1C 7.1 (H) 10/01/2020   HGBA1C 6.5 06/21/2018   HGBA1C 6.0 08/24/2016   HGBA1C 7.0 (H) 11/08/2015   Lab Results  Component Value Date   INSULIN 24.5 02/21/2021   INSULIN 19.5 10/28/2020   Lab Results  Component Value Date   TSH 1.400 10/28/2020   Lab Results  Component Value Date   CHOL 190 02/21/2021   HDL 50 02/21/2021   LDLCALC 118 (H) 02/21/2021   LDLDIRECT 169.3 06/28/2012   TRIG 122 02/21/2021   CHOLHDL 4 06/21/2018   Lab Results  Component Value Date   VD25OH 47.4 02/21/2021   VD25OH 23.3 (L) 10/28/2020   Lab Results  Component Value Date   WBC 8.4 09/16/2020   HGB 13.7 09/16/2020   HCT 41.7 09/16/2020   MCV 93.7 09/16/2020   PLT 247.0 09/16/2020   No results found for: IRON, TIBC, FERRITIN  Obesity Behavioral Intervention:   Approximately 15 minutes were spent on the discussion below.  ASK: We discussed the diagnosis of obesity with Vickie Nichols today and Vickie Nichols agreed to give [place] permission to discuss obesity behavioral modification therapy today.  ASSESS: Analys has the diagnosis of obesity and her BMI today is 30.0. Joelly is in the action stage of change.   ADVISE: [place] was educated on the multiple health risks of obesity as well as the benefit of weight loss to improve her health. She was advised of the need for long term treatment and the importance of lifestyle modifications to improve her current health and to decrease her risk of future health problems.  AGREE: Multiple dietary modification options and treatment options were discussed and Vickie Nichols agreed to follow the recommendations documented in the above note.  ARRANGE: Vickie Nichols was educated on the importance of frequent visits to treat obesity as outlined per CMS and USPSTF guidelines and agreed to schedule her next follow up  appointment today.  Attestation Statements:   Reviewed by clinician on day of visit: allergies, medications, problem list, medical history, surgical history, family history, social history, and previous encounter notes.  I, Davy Pique, RMA, am acting as Location manager for [employer], DO.  I have reviewed the above documentation for accuracy and completeness, and I agree with the above. Marjory Sneddon, D.O.  [employer] [place] was signed into law in 2016 which includes the topic of electronic health records.  This provides immediate access to information in MyChart.  This includes consultation notes, operative notes, office notes, lab results and pathology reports.  If you have any questions about what you read please let us know at your next visit so we can discuss your concerns and take corrective action if need be.  We are right here with you.

## 2021-06-13 ENCOUNTER — Ambulatory Visit (INDEPENDENT_AMBULATORY_CARE_PROVIDER_SITE_OTHER): Payer: Medicare Other | Admitting: Nurse Practitioner

## 2021-06-27 ENCOUNTER — Ambulatory Visit (INDEPENDENT_AMBULATORY_CARE_PROVIDER_SITE_OTHER): Payer: Medicare Other | Admitting: Family Medicine

## 2021-06-27 ENCOUNTER — Encounter (INDEPENDENT_AMBULATORY_CARE_PROVIDER_SITE_OTHER): Payer: Self-pay | Admitting: Family Medicine

## 2021-06-27 VITALS — BP 149/77 | HR 75 | Temp 98.5°F | Ht 68.0 in | Wt 196.0 lb

## 2021-06-27 DIAGNOSIS — E559 Vitamin D deficiency, unspecified: Secondary | ICD-10-CM | POA: Diagnosis not present

## 2021-06-27 DIAGNOSIS — E669 Obesity, unspecified: Secondary | ICD-10-CM | POA: Diagnosis not present

## 2021-06-27 DIAGNOSIS — Z6829 Body mass index (BMI) 29.0-29.9, adult: Secondary | ICD-10-CM

## 2021-06-27 MED ORDER — VITAMIN D (ERGOCALCIFEROL) 1.25 MG (50000 UNIT) PO CAPS: 50000.0000 [IU] | ORAL_CAPSULE | ORAL | 0 refills | Status: DC

## 2021-06-29 NOTE — Progress Notes (Signed)
Chief Complaint:   OBESITY Vickie Nichols is here to discuss her progress with her obesity treatment plan along with follow-up of her obesity related diagnoses. Vickie Nichols is on the Category 2 Plan and states she is following her eating plan approximately 35% of the time. Vickie Nichols states she is not currently exercising.  Today's visit was #: 15 Starting weight: 219 lbs Starting date: 10/28/2020 Today's weight: 196 lbs Today's date: 06/27/2021 Total lbs lost to date: 23 Total lbs lost since last in-office visit: 1  Interim History: Pt's last OV was approximately a month ago. She did not come in fasting, as she was supposed to  prior. Pt needs fasting labs.  Subjective:   1. Vitamin D deficiency She is currently taking prescription vitamin D 50,000 IU each week. She denies nausea, vomiting or muscle weakness.  Assessment/Plan:  No orders of the defined types were placed in this encounter.   Medications Discontinued During This Encounter  Medication Reason   Vitamin D, Ergocalciferol, (DRISDOL) 1.25 MG (50000 UNIT) CAPS capsule Reorder     Meds ordered this encounter  Medications   Vitamin D, Ergocalciferol, (DRISDOL) 1.25 MG (50000 UNIT) CAPS capsule    Sig: Take 1 capsule (50,000 Units total) by mouth every 7 (seven) days.    Dispense:  4 capsule    Refill:  0    30 d supply;  ** OV for RF **   Do not send RF request     1. Vitamin D deficiency Low Vitamin D level contributes to fatigue and are associated with obesity, breast, and colon cancer. She agrees to continue to take prescription Vitamin D '@50'$ ,000 IU every week and will follow-up for routine testing of Vitamin D, at least 2-3 times per year to avoid over-replacement.  Refill- Vitamin D, Ergocalciferol, (DRISDOL) 1.25 MG (50000 UNIT) CAPS capsule; Take 1 capsule (50,000 Units total) by mouth every 7 (seven) days.  Dispense: 4 capsule; Refill: 0  2. Obesity with current BMI of 29.9 Vickie Nichols is currently in the action  stage of change. As such, her goal is to continue with weight loss efforts. She has agreed to the Category 2 Plan with breakfast and lunch options.   Buy all foods and have them in the home each week. Meal prep and plan. Recipe Packet I and II given to pt.  Exercise goals: All adults should avoid inactivity. Some physical activity is better than none, and adults who participate in any amount of physical activity gain some health benefits.  Behavioral modification strategies: increasing lean protein intake, meal planning and cooking strategies, and avoiding temptations.  Vickie Nichols has agreed to follow-up with our clinic in 2-3 weeks with fasting blood work. She was informed of the importance of frequent follow-up visits to maximize her success with intensive lifestyle modifications for her multiple health conditions.   Objective:   Blood pressure (!) 149/77, pulse 75, temperature 98.5 F (36.9 C), height '5\' 8"'$  (1.727 m), weight 196 lb (88.9 kg), SpO2 97 %. Body mass index is 29.8 kg/m.  General: Cooperative, alert, well developed, in no acute distress. HEENT: Conjunctivae and lids unremarkable. Cardiovascular: Regular rhythm.  Lungs: Normal work of breathing. Neurologic: No focal deficits.   Lab Results  Component Value Date   CREATININE 0.93 02/21/2021   BUN 17 02/21/2021   NA 142 02/21/2021   K 4.6 02/21/2021   CL 101 02/21/2021   CO2 28 02/21/2021   Lab Results  Component Value Date   ALT 17 02/21/2021  AST 18 02/21/2021   ALKPHOS 89 02/21/2021   BILITOT 0.3 02/21/2021   Lab Results  Component Value Date   HGBA1C 6.1 (H) 01/31/2021   HGBA1C 7.1 (H) 10/01/2020   HGBA1C 6.5 06/21/2018   HGBA1C 6.0 08/24/2016   HGBA1C 7.0 (H) 11/08/2015   Lab Results  Component Value Date   INSULIN 24.5 02/21/2021   INSULIN 19.5 10/28/2020   Lab Results  Component Value Date   TSH 1.400 10/28/2020   Lab Results  Component Value Date   CHOL 190 02/21/2021   HDL 50 02/21/2021    LDLCALC 118 (H) 02/21/2021   LDLDIRECT 169.3 06/28/2012   TRIG 122 02/21/2021   CHOLHDL 4 06/21/2018   Lab Results  Component Value Date   VD25OH 47.4 02/21/2021   VD25OH 23.3 (L) 10/28/2020   Lab Results  Component Value Date   WBC 8.4 09/16/2020   HGB 13.7 09/16/2020   HCT 41.7 09/16/2020   MCV 93.7 09/16/2020   PLT 247.0 09/16/2020   No results found for: "IRON", "TIBC", "FERRITIN"  Obesity Behavioral Intervention:   Approximately 15 minutes were spent on the discussion below.  ASK: We discussed the diagnosis of obesity with [name] today and Garnett agreed to give [place] permission to discuss obesity behavioral modification therapy today.  ASSESS: Asuzena has the diagnosis of obesity and her BMI today is 29.9. Lee-Ann is in the action stage of change.   ADVISE: Domitila was educated on the multiple health risks of obesity as well as the benefit of weight loss to improve her health. She was advised of the need for long term treatment and the importance of lifestyle modifications to improve her current health and to decrease her risk of future health problems.  AGREE: Multiple dietary modification options and treatment options were discussed and Rmoni agreed to follow the recommendations documented in the above note.  ARRANGE: Marilene was educated on the importance of frequent visits to treat obesity as outlined per CMS and USPSTF guidelines and agreed to schedule her next follow up appointment today.  Attestation Statements:   Reviewed by clinician on day of visit: allergies, medications, problem list, medical history, surgical history, family history, social history, and previous encounter notes.  I, Kathlene November, BS, CMA, am acting as transcriptionist for [employer], DO.  I have reviewed the above documentation for accuracy and completeness, and I agree with the above. Marjory Sneddon, D.O.  The [place] was signed into law in 2016  which includes the topic of electronic health records.  This provides immediate access to information in MyChart.  This includes consultation notes, operative notes, office notes, lab results and pathology reports.  If you have any questions about what you read please let us know at your next visit so we can discuss your concerns and take corrective action if need be.  We are right here with you.

## 2021-07-18 ENCOUNTER — Encounter (INDEPENDENT_AMBULATORY_CARE_PROVIDER_SITE_OTHER): Payer: Self-pay | Admitting: Nurse Practitioner

## 2021-07-18 ENCOUNTER — Ambulatory Visit (INDEPENDENT_AMBULATORY_CARE_PROVIDER_SITE_OTHER): Payer: Medicare Other | Admitting: Nurse Practitioner

## 2021-07-18 VITALS — BP 131/81 | HR 67 | Temp 98.3°F | Ht 68.0 in | Wt 199.0 lb

## 2021-07-18 DIAGNOSIS — Z79899 Other long term (current) drug therapy: Secondary | ICD-10-CM

## 2021-07-18 DIAGNOSIS — E785 Hyperlipidemia, unspecified: Secondary | ICD-10-CM

## 2021-07-18 DIAGNOSIS — E669 Obesity, unspecified: Secondary | ICD-10-CM

## 2021-07-18 DIAGNOSIS — K760 Fatty (change of) liver, not elsewhere classified: Secondary | ICD-10-CM

## 2021-07-18 DIAGNOSIS — E559 Vitamin D deficiency, unspecified: Secondary | ICD-10-CM | POA: Diagnosis not present

## 2021-07-18 DIAGNOSIS — E1169 Type 2 diabetes mellitus with other specified complication: Secondary | ICD-10-CM

## 2021-07-18 DIAGNOSIS — Z6830 Body mass index (BMI) 30.0-30.9, adult: Secondary | ICD-10-CM

## 2021-07-18 DIAGNOSIS — E66811 Obesity, class 1: Secondary | ICD-10-CM

## 2021-07-18 DIAGNOSIS — Z7984 Long term (current) use of oral hypoglycemic drugs: Secondary | ICD-10-CM

## 2021-07-19 NOTE — Progress Notes (Unsigned)
Chief Complaint:   OBESITY [name] is here to discuss her progress with her obesity treatment plan along with follow-up of her obesity related diagnoses. Vickie Nichols is on the Category 2 Plan and states she is following her eating plan approximately 30% of the time. Vickie Nichols states she is walking 30 minutes 3 times per week.  Today's visit was #: 34 Starting weight: 219 lbs Starting date: 10/28/2020 Today's weight: 199 lbs Today's date: 07/18/2021 Total lbs lost to date: 20 lbs Total lbs lost since last in-office visit: 0  Interim History: Vickie Nichols stats not being diligent in following meal plan. She's not motivated. She felt better when she ate frozen meals for lunch. She is caring for her grandchild and feels that it throws things off. Her daughter works 3rd shift in the ER, 3-4 days per week. She's eating more junk. She is not fasting today. If following plan as " I should have I would have reached my goal".    Subjective:   1. Type 2 diabetes mellitus with other specified complication, without long-term current use of insulin (HCC) Vickie Nichols is currently taking Metformin 1000 mg twice a day. Denies any side effects. She is on statin but not on ACE or ARB. Hert last eye exam : 7/23.  2. Hyperlipidemia associated with type 2 diabetes mellitus ([place]) Vickie Nichols is currently taking Crestor 5 mg. Denies any side effects.  3. Vitamin D deficiency Vickie Nichols is currently taking prescription Vit D 50,000 IU once a week. Denies any nausea, vomiting or muscle weakness.  4. NAFLD (nonalcoholic fatty liver disease) Vickie Nichols's last [place] 10/14/20. Denies abdominal pain.  5. High risk medication use-Metformin [name] is on Metformin, Crestor, Vit D and Wellbutrin.  Assessment/Plan:   1. Type 2 diabetes mellitus with other specified complication, without long-term current use of insulin (HCC) We will obtain labs today. Good blood sugar control is important to decrease the likelihood of diabetic  complications such as nephropathy, neuropathy, limb loss, blindness, coronary artery disease, and death. Intensive lifestyle modification including diet, exercise and weight loss are the first line of treatment for diabetes.   - Comprehensive metabolic panel - Insulin, random - Hemoglobin A1c - CBC with Differential/Platelet  2. Hyperlipidemia associated with type 2 diabetes mellitus ([place]) We will obtain labs today. Cardiovascular risk and specific lipid/LDL goals reviewed.  We discussed several lifestyle modifications today and [name] will continue to work on diet, exercise and weight loss efforts. Orders and follow up as documented in patient record.   Counseling Intensive lifestyle modifications are the first line treatment for this issue. Dietary changes: Increase soluble fiber. Decrease simple carbohydrates. Exercise changes: Moderate to vigorous-intensity aerobic activity 150 minutes per week if tolerated. Lipid-lowering medications: see documented in medical record.  - Comprehensive metabolic panel - Lipid Panel With LDL/HDL Ratio - CBC with Differential/Platelet  3. Vitamin D deficiency We will obtain labs today. Low Vitamin D level contributes to fatigue and are associated with obesity, breast, and colon cancer. She agrees to continue to take prescription Vitamin D '@50'$ ,000 IU every week and will follow-up for routine testing of Vitamin D, at least 2-3 times per year to avoid over-replacement.  - Comprehensive metabolic panel - VITAMIN D 25 Hydroxy (Vit-D Deficiency, Fractures)  4. NAFLD (nonalcoholic fatty liver disease) We will obtain labs today. We discussed the likely diagnosis of non-alcoholic fatty liver disease today and how this condition is obesity related. Vickie Nichols was educated the importance of weight loss. Vickie Nichols agreed to continue with her weight  loss efforts with healthier diet and exercise as an essential part of her treatment plan.  - Comprehensive metabolic  panel - CBC with Differential/Platelet  5. High risk medication use-Metformin We will obtain labs today.  - Comprehensive metabolic panel - Vitamin U88 - CBC with Differential/Platelet  6. Obesity with current BMI of 30.3 [name] is currently in the action stage of change. As such, her goal is to continue with weight loss efforts. She has agreed to the Category 2 Plan.   [name] will come in next week for fasting labs.   Exercise goals: As is.  Behavioral modification strategies: increasing lean protein intake, increasing water intake, and planning for success.  Vickie Nichols has agreed to follow-up with our clinic in 3 weeks. She was informed of the importance of frequent follow-up visits to maximize her success with intensive lifestyle modifications for her multiple health conditions.   Vickie Nichols was informed we would discuss her lab results at her next visit unless there is a critical issue that needs to be addressed sooner. Vickie Nichols agreed to keep her next visit at the agreed upon time to discuss these results.  Objective:   Blood pressure 131/81, pulse 67, temperature 98.3 F (36.8 C), height '5\' 8"'$  (1.727 m), weight 199 lb (90.3 kg), SpO2 96 %. Body mass index is 30.26 kg/m.  General: Cooperative, alert, well developed, in no acute distress. HEENT: Conjunctivae and lids unremarkable. Cardiovascular: Regular rhythm.  Lungs: Normal work of breathing. Neurologic: No focal deficits.   Lab Results  Component Value Date   CREATININE 0.93 02/21/2021   BUN 17 02/21/2021   NA 142 02/21/2021   K 4.6 02/21/2021   CL 101 02/21/2021   CO2 28 02/21/2021   Lab Results  Component Value Date   ALT 17 02/21/2021   AST 18 02/21/2021   ALKPHOS 89 02/21/2021   BILITOT 0.3 02/21/2021   Lab Results  Component Value Date   HGBA1C 6.1 (H) 01/31/2021   HGBA1C 7.1 (H) 10/01/2020   HGBA1C 6.5 06/21/2018   HGBA1C 6.0 08/24/2016   HGBA1C 7.0 (H) 11/08/2015   Lab Results  Component  Value Date   INSULIN 24.5 02/21/2021   INSULIN 19.5 10/28/2020   Lab Results  Component Value Date   TSH 1.400 10/28/2020   Lab Results  Component Value Date   CHOL 190 02/21/2021   HDL 50 02/21/2021   LDLCALC 118 (H) 02/21/2021   LDLDIRECT 169.3 06/28/2012   TRIG 122 02/21/2021   CHOLHDL 4 06/21/2018   Lab Results  Component Value Date   VD25OH 47.4 02/21/2021   VD25OH 23.3 (L) 10/28/2020   Lab Results  Component Value Date   WBC 8.4 09/16/2020   HGB 13.7 09/16/2020   HCT 41.7 09/16/2020   MCV 93.7 09/16/2020   PLT 247.0 09/16/2020   No results found for: "IRON", "TIBC", "FERRITIN"  Attestation Statements:   Reviewed by clinician on day of visit: allergies, medications, problem list, medical history, surgical history, family history, social history, and previous encounter notes.  I, Brendell Tyus, RMA, am acting as transcriptionist for Everardo Pacific, FNP.  I have reviewed the above documentation for accuracy and completeness, and I agree with the above. -  ***

## 2021-07-28 LAB — CBC WITH DIFFERENTIAL/PLATELET
Basophils Absolute: 0 10*3/uL (ref 0.0–0.2)
Basos: 1 %
EOS (ABSOLUTE): 0.2 10*3/uL (ref 0.0–0.4)
Eos: 2 %
Hematocrit: 40.9 % (ref 34.0–46.6)
Hemoglobin: 13.7 g/dL (ref 11.1–15.9)
Immature Grans (Abs): 0 10*3/uL (ref 0.0–0.1)
Immature Granulocytes: 0 %
Lymphocytes Absolute: 4.4 10*3/uL — ABNORMAL HIGH (ref 0.7–3.1)
Lymphs: 51 %
MCH: 31.3 pg (ref 26.6–33.0)
MCHC: 33.5 g/dL (ref 31.5–35.7)
MCV: 93 fL (ref 79–97)
Monocytes Absolute: 0.5 10*3/uL (ref 0.1–0.9)
Monocytes: 6 %
Neutrophils Absolute: 3.3 10*3/uL (ref 1.4–7.0)
Neutrophils: 40 %
Platelets: 261 10*3/uL (ref 150–450)
RBC: 4.38 x10E6/uL (ref 3.77–5.28)
RDW: 12.1 % (ref 11.7–15.4)
WBC: 8.4 10*3/uL (ref 3.4–10.8)

## 2021-07-28 LAB — COMPREHENSIVE METABOLIC PANEL
ALT: 15 IU/L (ref 0–32)
AST: 21 IU/L (ref 0–40)
Albumin/Globulin Ratio: 1.8 (ref 1.2–2.2)
Albumin: 4.4 g/dL (ref 3.9–4.9)
Alkaline Phosphatase: 73 IU/L (ref 44–121)
BUN/Creatinine Ratio: 13 (ref 12–28)
BUN: 11 mg/dL (ref 8–27)
Bilirubin Total: 0.3 mg/dL (ref 0.0–1.2)
CO2: 26 mmol/L (ref 20–29)
Calcium: 9.5 mg/dL (ref 8.7–10.3)
Chloride: 105 mmol/L (ref 96–106)
Creatinine, Ser: 0.85 mg/dL (ref 0.57–1.00)
Globulin, Total: 2.5 g/dL (ref 1.5–4.5)
Glucose: 99 mg/dL (ref 70–99)
Potassium: 4.7 mmol/L (ref 3.5–5.2)
Sodium: 145 mmol/L — ABNORMAL HIGH (ref 134–144)
Total Protein: 6.9 g/dL (ref 6.0–8.5)
eGFR: 76 mL/min/{1.73_m2} (ref >59)

## 2021-07-28 LAB — LIPID PANEL WITH LDL/HDL RATIO
Cholesterol, Total: 127 mg/dL (ref 100–199)
HDL: 51 mg/dL (ref >39)
LDL Chol Calc (NIH): 52 mg/dL (ref 0–99)
LDL/HDL Ratio: 1 ratio (ref 0.0–3.2)
Triglycerides: 142 mg/dL (ref 0–149)
VLDL Cholesterol Cal: 24 mg/dL (ref 5–40)

## 2021-07-28 LAB — INSULIN, RANDOM: INSULIN: 17.1 u[IU]/mL (ref 2.6–24.9)

## 2021-07-28 LAB — VITAMIN D 25 HYDROXY (VIT D DEFICIENCY, FRACTURES): Vit D, 25-Hydroxy: 47.5 ng/mL (ref 30.0–100.0)

## 2021-07-28 LAB — VITAMIN B12: Vitamin B-12: 332 pg/mL (ref 232–1245)

## 2021-07-28 LAB — HEMOGLOBIN A1C
Est. average glucose Bld gHb Est-mCnc: 131 mg/dL
Hgb A1c MFr Bld: 6.2 % — ABNORMAL HIGH (ref 4.8–5.6)

## 2021-08-03 ENCOUNTER — Encounter (INDEPENDENT_AMBULATORY_CARE_PROVIDER_SITE_OTHER): Payer: Self-pay | Admitting: Family Medicine

## 2021-08-03 ENCOUNTER — Ambulatory Visit (INDEPENDENT_AMBULATORY_CARE_PROVIDER_SITE_OTHER): Payer: Medicare Other | Admitting: Family Medicine

## 2021-08-03 VITALS — BP 122/76 | HR 70 | Temp 98.2°F | Ht 68.0 in | Wt 199.0 lb

## 2021-08-03 DIAGNOSIS — E559 Vitamin D deficiency, unspecified: Secondary | ICD-10-CM

## 2021-08-03 DIAGNOSIS — K760 Fatty (change of) liver, not elsewhere classified: Secondary | ICD-10-CM

## 2021-08-03 DIAGNOSIS — E669 Obesity, unspecified: Secondary | ICD-10-CM

## 2021-08-03 DIAGNOSIS — E785 Hyperlipidemia, unspecified: Secondary | ICD-10-CM

## 2021-08-03 DIAGNOSIS — E538 Deficiency of other specified B group vitamins: Secondary | ICD-10-CM | POA: Diagnosis not present

## 2021-08-03 DIAGNOSIS — E1169 Type 2 diabetes mellitus with other specified complication: Secondary | ICD-10-CM | POA: Diagnosis not present

## 2021-08-03 DIAGNOSIS — Z6830 Body mass index (BMI) 30.0-30.9, adult: Secondary | ICD-10-CM

## 2021-08-03 MED ORDER — VITAMIN D (ERGOCALCIFEROL) 1.25 MG (50000 UNIT) PO CAPS: 50000.0000 [IU] | ORAL_CAPSULE | ORAL | 0 refills | Status: DC

## 2021-08-03 MED ORDER — CYANOCOBALAMIN 500 MCG PO TABS: ORAL_TABLET | ORAL | Status: DC

## 2021-08-08 NOTE — Progress Notes (Unsigned)
Chief Complaint:   OBESITY Vickie Nichols is here to discuss her progress with her obesity treatment plan along with follow-up of her obesity related diagnoses. Vickie Nichols is on the Category 2 Plan and states she is following her eating plan approximately 36% of the time. Vickie Nichols states she is not currently exercising.  Today's visit was #: 72 Starting weight: 219 lbs Starting date: 10/28/2020 Today's weight: 199 lbs Today's date: 08/03/2021 Total lbs lost to date: 20 Total lbs lost since last in-office visit: 0  Interim History: Vickie Nichols saw [place] for her last OV, and had labs done then. She joined [employer] recently and knows this will help motivate her to stay on plan more.  Subjective:   1. Type 2 diabetes mellitus with other specified complication, without long-term current use of insulin (HCC) Worsening. Discussed labs with patient today. Pt's A1c went from 6.1 to 6.2, which is still at goal but worse. She has been eating more carbs the past several months.  2. Hyperlipidemia associated with type 2 diabetes mellitus ([place]) Discussed labs with patient today. Pt has been on Crestor 0.5 mg tabs at bedtime.   3. NAFLD (nonalcoholic fatty liver disease) Discussed labs with patient today. Vickie Nichols has been eating better and losing weight.  4. Vitamin D deficiency Discussed labs with patient today. Vickie Nichols is tolerating medication(s) well without side effects.  Medication compliance is good as patient endorses taking it as prescribed.  The patient denies additional concerns regarding this condition. Medication: Ergocalciferol  5. B12 deficiency New. Discussed labs with patient today. Vickie Nichols's B12 is only at 332 and not at goal of 500 or greater.  Assessment/Plan:  No orders of the defined types were placed in this encounter.   Medications Discontinued During This Encounter  Medication Reason   Vitamin D, Ergocalciferol, (DRISDOL) 1.25 MG (50000 UNIT) CAPS  capsule Reorder     Meds ordered this encounter  Medications   Vitamin D, Ergocalciferol, (DRISDOL) 1.25 MG (50000 UNIT) CAPS capsule    Sig: Take 1 capsule (50,000 Units total) by mouth every 7 (seven) days.    Dispense:  4 capsule    Refill:  0    30 d supply;  ** OV for RF **   Do not send RF request   cyanocobalamin (VITAMIN B12) 500 MCG tablet    Sig: 300 mcg- 500 mcg qd     1. Type 2 diabetes mellitus with other specified complication, without long-term current use of insulin (HCC) Good blood sugar control is important to decrease the likelihood of diabetic complications such as nephropathy, neuropathy, limb loss, blindness, coronary artery disease, and death. Intensive lifestyle modification including diet, exercise and weight loss are the first line of treatment for diabetes. Continue Metformin. Pt declines other meds and desires to get off all meds.  2. Hyperlipidemia associated with type 2 diabetes mellitus ([place]) LDL is finally at goal at 52 (down from 120). Cardiovascular risk and specific lipid/LDL goals reviewed.  We discussed several lifestyle modifications today and Vickie Nichols will continue to work on diet, exercise and weight loss efforts. Orders and follow up as documented in patient record. Continue Crestor at current dose.  Counseling Intensive lifestyle modifications are the first line treatment for this issue. Dietary changes: Increase soluble fiber. Decrease simple carbohydrates. Exercise changes: Moderate to vigorous-intensity aerobic activity 150 minutes per week if tolerated. Lipid-lowering medications: see documented in medical record.  3. NAFLD (nonalcoholic fatty liver disease) ALT and AST within normal limits. We  discussed the diagnosis of non-alcoholic fatty liver disease today and how this condition is obesity related. Vickie Nichols was educated the importance of weight loss. Vickie Nichols agreed to continue with her weight loss efforts with healthier diet and exercise  as an essential part of her treatment plan.  4. Vitamin D deficiency Vit D at goal. Low Vitamin D level contributes to fatigue and are associated with obesity, breast, and colon cancer. She agrees to continue to take prescription Vitamin D '@50'$ ,000 IU every week and will follow-up for routine testing of Vitamin D, at least 2-3 times per year to avoid over-replacement. Pt has scheduled her first bone density scan.  Refill- Vitamin D, Ergocalciferol, (DRISDOL) 1.25 MG (50000 UNIT) CAPS capsule; Take 1 capsule (50,000 Units total) by mouth every 7 (seven) days.  Dispense: 4 capsule; Refill: 0  5. B12 deficiency The diagnosis was reviewed with the patient. Counseling provided today, see below. We will continue to monitor. Orders and follow up as documented in patient record. Start OTC B12 300-500 mcg daily.  Counseling The body needs vitamin B12: to make red blood cells; to make DNA; and to help the nerves work properly so they can carry messages from the brain to the body.  The main causes of vitamin B12 deficiency include dietary deficiency, digestive diseases, pernicious anemia, and having a surgery in which part of the stomach or small intestine is removed.  Certain medicines can make it harder for the body to absorb vitamin B12. These medicines include: heartburn medications; some antibiotics; some medications used to treat diabetes, gout, and high cholesterol.  In some cases, there are no symptoms of this condition. If the condition leads to anemia or nerve damage, various symptoms can occur, such as weakness or fatigue, shortness of breath, and numbness or tingling in your hands and feet.   Treatment:  May include taking vitamin B12 supplements.  Avoid alcohol.  Eat lots of healthy foods that contain vitamin B12: Beef, pork, chicken, [place], and organ meats, such as liver.  Seafood: This includes clams, rainbow trout, salmon, tuna, and haddock. Eggs.  Cereal and dairy products that are  fortified: This means that vitamin B12 has been added to the food.   Start- cyanocobalamin (VITAMIN B12) 500 MCG tablet; 300 mcg- 500 mcg qd  6. Obesity with current BMI of 30.4 Vickie Nichols is currently in the action stage of change. As such, her goal is to continue with weight loss efforts. She has agreed to the Category 2 Plan.   Exercise goals:  Go to the gym 3 days a week.  Behavioral modification strategies: increasing lean protein intake, decreasing simple carbohydrates, and planning for success.  Vickie Nichols has agreed to follow-up with our clinic in 3 weeks with FNP Vickie Nichols. She was informed of the importance of frequent follow-up visits to maximize her success with intensive lifestyle modifications for her multiple health conditions.   Objective:   Blood pressure 122/76, pulse 70, temperature 98.2 F (36.8 C), height '5\' 8"'$  (1.727 m), weight 199 lb (90.3 kg), SpO2 96 %. Body mass index is 30.26 kg/m.  General: Cooperative, alert, well developed, in no acute distress. HEENT: Conjunctivae and lids unremarkable. Cardiovascular: Regular rhythm.  Lungs: Normal work of breathing. Neurologic: No focal deficits.   Lab Results  Component Value Date   CREATININE 0.85 07/27/2021   BUN 11 07/27/2021   NA 145 (H) 07/27/2021   K 4.7 07/27/2021   CL 105 07/27/2021   CO2 26 07/27/2021   Lab Results  Component  Value Date   ALT 15 07/27/2021   AST 21 07/27/2021   ALKPHOS 73 07/27/2021   BILITOT 0.3 07/27/2021   Lab Results  Component Value Date   HGBA1C 6.2 (H) 07/27/2021   HGBA1C 6.1 (H) 01/31/2021   HGBA1C 7.1 (H) 10/01/2020   HGBA1C 6.5 06/21/2018   HGBA1C 6.0 08/24/2016   Lab Results  Component Value Date   INSULIN 17.1 07/27/2021   INSULIN 24.5 02/21/2021   INSULIN 19.5 10/28/2020   Lab Results  Component Value Date   TSH 1.400 10/28/2020   Lab Results  Component Value Date   CHOL 127 07/27/2021   HDL 51 07/27/2021   LDLCALC 52 07/27/2021   LDLDIRECT 169.3  06/28/2012   TRIG 142 07/27/2021   CHOLHDL 4 06/21/2018   Lab Results  Component Value Date   VD25OH 47.5 07/27/2021   VD25OH 47.4 02/21/2021   VD25OH 23.3 (L) 10/28/2020   Lab Results  Component Value Date   WBC 8.4 07/27/2021   HGB 13.7 07/27/2021   HCT 40.9 07/27/2021   MCV 93 07/27/2021   PLT 261 07/27/2021   No results found for: "IRON", "TIBC", "FERRITIN"  Obesity Behavioral Intervention:   Approximately 15 minutes were spent on the discussion below.  ASK: We discussed the diagnosis of obesity with Vickie Nichols today and Ova agreed to give [place] permission to discuss obesity behavioral modification therapy today.  ASSESS: Therese has the diagnosis of obesity and her BMI today is 30.4. Shahla is in the action stage of change.   ADVISE: Vickie Nichols was educated on the multiple health risks of obesity as well as the benefit of weight loss to improve her health. She was advised of the need for long term treatment and the importance of lifestyle modifications to improve her current health and to decrease her risk of future health problems.  AGREE: Multiple dietary modification options and treatment options were discussed and Kaiana agreed to follow the recommendations documented in the above note.  ARRANGE: Chynna was educated on the importance of frequent visits to treat obesity as outlined per CMS and USPSTF guidelines and agreed to schedule her next follow up appointment today.  Attestation Statements:   Reviewed by clinician on day of visit: allergies, medications, problem list, medical history, surgical history, family history, social history, and previous encounter notes.  I, Kathlene November, BS, CMA, am acting as transcriptionist for [employer], DO.  I have reviewed the above documentation for accuracy and completeness, and I agree with the above. Marjory Sneddon, D.O.  The [place] was signed into law in 2016 which includes the topic of  electronic health records.  This provides immediate access to information in MyChart.  This includes consultation notes, operative notes, office notes, lab results and pathology reports.  If you have any questions about what you read please let us know at your next visit so we can discuss your concerns and take corrective action if need be.  We are right here with you.

## 2021-08-10 ENCOUNTER — Encounter (INDEPENDENT_AMBULATORY_CARE_PROVIDER_SITE_OTHER): Payer: Self-pay

## 2021-08-16 ENCOUNTER — Other Ambulatory Visit (INDEPENDENT_AMBULATORY_CARE_PROVIDER_SITE_OTHER): Payer: Self-pay | Admitting: Family Medicine

## 2021-08-29 ENCOUNTER — Encounter (INDEPENDENT_AMBULATORY_CARE_PROVIDER_SITE_OTHER): Payer: Self-pay | Admitting: Nurse Practitioner

## 2021-08-29 ENCOUNTER — Ambulatory Visit (INDEPENDENT_AMBULATORY_CARE_PROVIDER_SITE_OTHER): Payer: Medicare Other | Admitting: Nurse Practitioner

## 2021-08-29 VITALS — BP 100/65 | HR 78 | Temp 98.1°F | Ht 68.0 in | Wt 201.0 lb

## 2021-08-29 DIAGNOSIS — E538 Deficiency of other specified B group vitamins: Secondary | ICD-10-CM | POA: Diagnosis not present

## 2021-08-29 DIAGNOSIS — E669 Obesity, unspecified: Secondary | ICD-10-CM | POA: Diagnosis not present

## 2021-08-29 DIAGNOSIS — E559 Vitamin D deficiency, unspecified: Secondary | ICD-10-CM | POA: Diagnosis not present

## 2021-08-29 DIAGNOSIS — Z6830 Body mass index (BMI) 30.0-30.9, adult: Secondary | ICD-10-CM

## 2021-08-29 MED ORDER — VITAMIN D (ERGOCALCIFEROL) 1.25 MG (50000 UNIT) PO CAPS: 50000.0000 [IU] | ORAL_CAPSULE | ORAL | 0 refills | Status: DC

## 2021-09-05 DIAGNOSIS — E538 Deficiency of other specified B group vitamins: Secondary | ICD-10-CM | POA: Insufficient documentation

## 2021-09-05 NOTE — Progress Notes (Signed)
Chief Complaint:   OBESITY Vickie Nichols is here to discuss her progress with her obesity treatment plan along with follow-up of her obesity related diagnoses. Vickie Nichols is on the Category 2 Plan and states she is following her eating plan approximately 33% of the time. Vickie Nichols states she is walking and the gym 90 minutes 5 times per week.  Today's visit was #: 16 Starting weight: 219 lbs Starting date: 10/28/2020 Today's weight: 201 lbs Today's date: 08/29/2021 Total lbs lost to date: 18 lbs Total lbs lost since last in-office visit: +2 lbs  Interim History: was sick last week and was not able to work out.  Prior to getting sick she was going to the gym on a regular basis.  Knows she needs to meal prep.  When she works out she makes better food choices.  Her highest weight was 230 lbs.  States "I need to buy the food and be more consistent".  Only follows the plan for breakfast and is skipping meals.    Subjective:   1. Vitamin D deficiency She is currently taking prescription vitamin D 50,000 IU each week. She denies nausea, vomiting or muscle weakness.  2. Low serum vitamin B12 Taking B-12 500 mcg OTC.  Denies side effects  Assessment/Plan:   1. Vitamin D deficiency Refill- Vitamin D, Ergocalciferol, (DRISDOL) 1.25 MG (50000 UNIT) CAPS capsule; Take 1 capsule (50,000 Units total) by mouth every 7 (seven) days.  Dispense: 4 capsule; Refill: 0  side effects discussed.   Low Vitamin D level contributes to fatigue and are associated with obesity, breast, and colon cancer. She agrees to continue to take prescription Vitamin D '@50'$ ,000 IU every week and will follow-up for routine testing of Vitamin D, at least 2-3 times per year to avoid over-replacement.   2. Low serum vitamin B12 Continue OTC B-12.  3. Obesity with current BMI of 30.6 Vickie Nichols is currently in the action stage of change. As such, her goal is to continue with weight loss efforts. She has agreed to keeping a food  journal and adhering to recommended goals of 1200-1300 calories and 80+ protein.   Exercise goals:  to restart going back to the gym.   Behavioral modification strategies: increasing lean protein intake, increasing vegetables, increasing water intake, no skipping meals, and meal planning and cooking strategies.  Vickie Nichols has agreed to follow-up with our clinic in 2 weeks. She was informed of the importance of frequent follow-up visits to maximize her success with intensive lifestyle modifications for her multiple health conditions.   Objective:   Blood pressure 100/65, pulse 78, temperature 98.1 F (36.7 C), height '5\' 8"'$  (1.727 m), weight 201 lb (91.2 kg), SpO2 97 %. Body mass index is 30.56 kg/m.  General: Cooperative, alert, well developed, in no acute distress. HEENT: Conjunctivae and lids unremarkable. Cardiovascular: Regular rhythm.  Lungs: Normal work of breathing. Neurologic: No focal deficits.   Lab Results  Component Value Date   CREATININE 0.85 07/27/2021   BUN 11 07/27/2021   NA 145 (H) 07/27/2021   K 4.7 07/27/2021   CL 105 07/27/2021   CO2 26 07/27/2021   Lab Results  Component Value Date   ALT 15 07/27/2021   AST 21 07/27/2021   ALKPHOS 73 07/27/2021   BILITOT 0.3 07/27/2021   Lab Results  Component Value Date   HGBA1C 6.2 (H) 07/27/2021   HGBA1C 6.1 (H) 01/31/2021   HGBA1C 7.1 (H) 10/01/2020   HGBA1C 6.5 06/21/2018   HGBA1C 6.0 08/24/2016  Lab Results  Component Value Date   INSULIN 17.1 07/27/2021   INSULIN 24.5 02/21/2021   INSULIN 19.5 10/28/2020   Lab Results  Component Value Date   TSH 1.400 10/28/2020   Lab Results  Component Value Date   CHOL 127 07/27/2021   HDL 51 07/27/2021   LDLCALC 52 07/27/2021   LDLDIRECT 169.3 06/28/2012   TRIG 142 07/27/2021   CHOLHDL 4 06/21/2018   Lab Results  Component Value Date   VD25OH 47.5 07/27/2021   VD25OH 47.4 02/21/2021   VD25OH 23.3 (L) 10/28/2020   Lab Results  Component Value Date    WBC 8.4 07/27/2021   HGB 13.7 07/27/2021   HCT 40.9 07/27/2021   MCV 93 07/27/2021   PLT 261 07/27/2021   No results found for: "IRON", "TIBC", "FERRITIN"  Obesity Behavioral Intervention:   Approximately 15 minutes were spent on the discussion below.  ASK: We discussed the diagnosis of obesity with Vickie Nichols today and Vickie Nichols agreed to give [place] permission to discuss obesity behavioral modification therapy today.  ASSESS: Vickie Nichols has the diagnosis of obesity and her BMI today is 30.6. Vickie Nichols is in the action stage of change.   ADVISE: Vickie Nichols was educated on the multiple health risks of obesity as well as the benefit of weight loss to improve her health. She was advised of the need for long term treatment and the importance of lifestyle modifications to improve her current health and to decrease her risk of future health problems.  AGREE: Multiple dietary modification options and treatment options were discussed and Vickie Nichols agreed to follow the recommendations documented in the above note.  ARRANGE: Vickie Nichols was educated on the importance of frequent visits to treat obesity as outlined per CMS and USPSTF guidelines and agreed to schedule her next follow up appointment today.  Attestation Statements:   Reviewed by clinician on day of visit: allergies, medications, problem list, medical history, surgical history, family history, social history, and previous encounter notes.  I, Davy Pique, RMA, am acting as transcriptionist for Everardo Pacific, FNP  I have reviewed the above documentation for accuracy and completeness, and I agree with the above. Everardo Pacific, FNP

## 2021-09-12 ENCOUNTER — Ambulatory Visit (INDEPENDENT_AMBULATORY_CARE_PROVIDER_SITE_OTHER): Payer: Medicare Other | Admitting: Family Medicine

## 2021-09-12 ENCOUNTER — Encounter (INDEPENDENT_AMBULATORY_CARE_PROVIDER_SITE_OTHER): Payer: Self-pay | Admitting: Family Medicine

## 2021-09-12 VITALS — BP 127/73 | HR 70 | Temp 98.5°F | Ht 68.0 in | Wt 204.0 lb

## 2021-09-12 DIAGNOSIS — E559 Vitamin D deficiency, unspecified: Secondary | ICD-10-CM

## 2021-09-12 DIAGNOSIS — E669 Obesity, unspecified: Secondary | ICD-10-CM

## 2021-09-12 DIAGNOSIS — E1169 Type 2 diabetes mellitus with other specified complication: Secondary | ICD-10-CM

## 2021-09-12 DIAGNOSIS — Z7984 Long term (current) use of oral hypoglycemic drugs: Secondary | ICD-10-CM

## 2021-09-12 DIAGNOSIS — Z6831 Body mass index (BMI) 31.0-31.9, adult: Secondary | ICD-10-CM | POA: Diagnosis not present

## 2021-09-12 MED ORDER — VITAMIN D (ERGOCALCIFEROL) 1.25 MG (50000 UNIT) PO CAPS: 50000.0000 [IU] | ORAL_CAPSULE | ORAL | 0 refills | Status: DC

## 2021-09-17 NOTE — Progress Notes (Signed)
Chief Complaint:   OBESITY Vickie Nichols is here to discuss her progress with her obesity treatment plan along with follow-up of her obesity related diagnoses. Vickie Nichols is on keeping a food journal and adhering to recommended goals of 1200-1300 calories and 80+ grams protein and states she is following her eating plan approximately 30% of the time. Vickie Nichols states she is walking and weight training 90 minutes 1 times per week.  Today's visit was #: 37 Starting weight: 219 lbs Starting date: 10/28/2020 Today's weight: 204 lbs Today's date: 09/12/2021 Total lbs lost to date: 15 Total lbs lost since last in-office visit: +3  Interim History: Vickie Nichols joined [employer] for $55 a month but hasn't gone much. She reports she has had a lot of stress lately and is caring for others and not herself. Pt states she is too overwhelmed with journaling and prefers to get back on category 2. She will laminate it and cross off as she eats foods.   Subjective:   1. Type 2 diabetes mellitus with other specified complication, without long-term current use of insulin (HCC) [name] is not checking her blood sugars at home. However, her A1c is well controlled and less than 6.5. Medication: Metformin  2. Vitamin D deficiency She is currently taking prescription vitamin D 50,000 IU each week. She denies nausea, vomiting or muscle weakness. [name] is tolerating medication(s) well without side effects.  Medication compliance is good as patient endorses taking it as prescribed.  The patient denies additional concerns regarding this condition.       Assessment/Plan:   Medications Discontinued During This Encounter  Medication Reason   Vitamin D, Ergocalciferol, (DRISDOL) 1.25 MG (50000 UNIT) CAPS capsule Reorder     Meds ordered this encounter  Medications   Vitamin D, Ergocalciferol, (DRISDOL) 1.25 MG (50000 UNIT) CAPS capsule    Sig: Take 1 capsule (50,000 Units total) by mouth every 7 (seven)  days.    Dispense:  4 capsule    Refill:  0    30 d supply;  ** OV for RF **   Do not send RF request     1. Type 2 diabetes mellitus with other specified complication, without long-term current use of insulin (HCC) Good blood sugar control is important to decrease the likelihood of diabetic complications such as nephropathy, neuropathy, limb loss, blindness, coronary artery disease, and death. Intensive lifestyle modification including diet, exercise and weight loss are the first line of treatment for diabetes.  Discussed with pt that we can entertain GLP-1 meds, but pt declines and wants to take less meds, not more.  2. Vitamin D deficiency Low Vitamin D level contributes to fatigue and are associated with obesity, breast, and colon cancer. She agrees to continue to take prescription Vitamin D '@50'$ ,000 IU every week and will follow-up for routine testing of Vitamin D, at least 2-3 times per year to avoid over-replacement. Pt will contact PCP regarding bone density scan.  Refill- Vitamin D, Ergocalciferol, (DRISDOL) 1.25 MG (50000 UNIT) CAPS capsule; Take 1 capsule (50,000 Units total) by mouth every 7 (seven) days.  Dispense: 4 capsule; Refill: 0  3. Obesity with current BMI of 31 Vickie Nichols is currently in the action stage of change. As such, her goal is to continue with weight loss efforts. She has prefers to get back on the Category 2 Plan with breakfast and lunch options.   Laminate category 2 meal plan and cross foods as eaten. Continue to increase exercise at [facility] as  tolerated.  Exercise goals:  Increase as tolerated on a more regular basis.  Behavioral modification strategies: increasing lean protein intake, decreasing simple carbohydrates, increasing water intake, and no skipping meals.  Vickie Nichols has agreed to follow-up with our clinic in 3 weeks with Vickie Nichols, Vickie Nichols. She was informed of the importance of frequent follow-up visits to maximize her success with intensive lifestyle  modifications for her multiple health conditions.    Objective:   Blood pressure 127/73, pulse 70, temperature 98.5 F (36.9 C), height '5\' 8"'$  (1.727 m), weight 204 lb (92.5 kg), SpO2 98 %. Body mass index is 31.02 kg/m.  General: Cooperative, alert, well developed, in no acute distress. HEENT: Conjunctivae and lids unremarkable. Cardiovascular: Regular rhythm.  Lungs: Normal work of breathing. Neurologic: No focal deficits.   Lab Results  Component Value Date   CREATININE 0.85 07/27/2021   BUN 11 07/27/2021   NA 145 (H) 07/27/2021   K 4.7 07/27/2021   CL 105 07/27/2021   CO2 26 07/27/2021   Lab Results  Component Value Date   ALT 15 07/27/2021   AST 21 07/27/2021   ALKPHOS 73 07/27/2021   BILITOT 0.3 07/27/2021   Lab Results  Component Value Date   HGBA1C 6.2 (H) 07/27/2021   HGBA1C 6.1 (H) 01/31/2021   HGBA1C 7.1 (H) 10/01/2020   HGBA1C 6.5 06/21/2018   HGBA1C 6.0 08/24/2016   Lab Results  Component Value Date   INSULIN 17.1 07/27/2021   INSULIN 24.5 02/21/2021   INSULIN 19.5 10/28/2020   Lab Results  Component Value Date   TSH 1.400 10/28/2020   Lab Results  Component Value Date   CHOL 127 07/27/2021   HDL 51 07/27/2021   LDLCALC 52 07/27/2021   LDLDIRECT 169.3 06/28/2012   TRIG 142 07/27/2021   CHOLHDL 4 06/21/2018   Lab Results  Component Value Date   VD25OH 47.5 07/27/2021   VD25OH 47.4 02/21/2021   VD25OH 23.3 (L) 10/28/2020   Lab Results  Component Value Date   WBC 8.4 07/27/2021   HGB 13.7 07/27/2021   HCT 40.9 07/27/2021   MCV 93 07/27/2021   PLT 261 07/27/2021   No results found for: "IRON", "TIBC", "FERRITIN"  Obesity Behavioral Intervention:   Approximately 15 minutes were spent on the discussion below.  ASK: We discussed the diagnosis of obesity with Vickie Nichols today and Payton agreed to give [place] permission to discuss obesity behavioral modification therapy today.  ASSESS: Sokhna has the diagnosis of obesity and her BMI  today is 31.0. Vickie is in the action stage of change.   ADVISE: Nathan was educated on the multiple health risks of obesity as well as the benefit of weight loss to improve her health. She was advised of the need for long term treatment and the importance of lifestyle modifications to improve her current health and to decrease her risk of future health problems.  AGREE: Multiple dietary modification options and treatment options were discussed and Jasminemarie agreed to follow the recommendations documented in the above note.  ARRANGE: Lian was educated on the importance of frequent visits to treat obesity as outlined per CMS and USPSTF guidelines and agreed to schedule her next follow up appointment today.  Attestation Statements:   Reviewed by clinician on day of visit: allergies, medications, problem list, medical history, surgical history, family history, social history, and previous encounter notes.  I, Kathlene November, BS, CMA, am acting as transcriptionist for [employer], DO.  I have reviewed the above documentation for accuracy and  completeness, and I agree with the above. Marjory Sneddon, D.O.  The [place] was signed into law in 2016 which includes the topic of electronic health records.  This provides immediate access to information in MyChart.  This includes consultation notes, operative notes, office notes, lab results and pathology reports.  If you have any questions about what you read please let us know at your next visit so we can discuss your concerns and take corrective action if need be.  We are right here with you.

## 2021-09-19 ENCOUNTER — Ambulatory Visit (INDEPENDENT_AMBULATORY_CARE_PROVIDER_SITE_OTHER): Payer: Medicare Other

## 2021-09-19 ENCOUNTER — Telehealth: Payer: Self-pay

## 2021-09-19 VITALS — Ht 69.0 in | Wt 204.0 lb

## 2021-09-19 DIAGNOSIS — Z780 Asymptomatic menopausal state: Secondary | ICD-10-CM

## 2021-09-19 DIAGNOSIS — Z1231 Encounter for screening mammogram for malignant neoplasm of breast: Secondary | ICD-10-CM | POA: Diagnosis not present

## 2021-09-19 DIAGNOSIS — Z122 Encounter for screening for malignant neoplasm of respiratory organs: Secondary | ICD-10-CM | POA: Diagnosis not present

## 2021-09-19 DIAGNOSIS — Z0000 Encounter for general adult medical examination without abnormal findings: Secondary | ICD-10-CM

## 2021-09-19 NOTE — Progress Notes (Signed)
Subjective:   Vickie Nichols is a 66 y.o. female who presents for Medicare Annual (Subsequent) preventive examination.  I connected with Genessa today by telephone and verified that I am speaking with the correct person using two identifiers. Location patient: home Location provider: work Persons participating in the virtual visit: patient, Marine scientist.    I discussed the limitations, risks, security and privacy concerns of performing an evaluation and management service by telephone and the availability of in person appointments. I also discussed with the patient that there may be a patient responsible charge related to this service. The patient expressed understanding and verbally consented to this telephonic visit.    Interactive audio and video telecommunications were attempted between this provider and patient, however failed, due to patient having technical difficulties OR patient did not have access to video capability.  We continued and completed visit with audio only.  Some vital signs may be absent or patient reported.   Time Spent with patient on telephone encounter: 30 minutes   Review of Systems     Cardiac Risk Factors include: advanced age (>85mn, >>93women);diabetes mellitus;dyslipidemia;obesity (BMI >30kg/m2)     Objective:    Today's Vitals   09/19/21 1238  Weight: 204 lb (92.5 kg)  Height: '5\' 9"'$  (1.753 m)   Body mass index is 30.13 kg/m.     09/19/2021   12:40 PM 12/17/2017    8:38 PM  Advanced Directives  Does Patient Have a Medical Advance Directive? No No  Would patient like information on creating a medical advance directive?  No - Patient declined    Current Medications (verified) Outpatient Encounter Medications as of 09/19/2021  Medication Sig   buPROPion (WELLBUTRIN XL) 300 MG 24 hr tablet TAKE 1 TABLET BY MOUTH EVERY DAY   cyanocobalamin (VITAMIN B12) 500 MCG tablet 300 mcg- 500 mcg qd   metFORMIN (GLUCOPHAGE) 1000 MG tablet Take 1 tablet  (1,000 mg total) by mouth 2 (two) times daily with a meal.   rosuvastatin (CRESTOR) 5 MG tablet Take 0.5 tablets (2.5 mg total) by mouth at bedtime.   Vitamin D, Ergocalciferol, (DRISDOL) 1.25 MG (50000 UNIT) CAPS capsule Take 1 capsule (50,000 Units total) by mouth every 7 (seven) days.   No facility-administered encounter medications on file as of 09/19/2021.    Allergies (verified) Patient has no known allergies.   History: Past Medical History:  Diagnosis Date   Anxiety    Depression    Diabetes mellitus without complication ([place]    Fatty liver    Joint pain    Leg pain    when walking   Other fatigue    Postmenopausal    Shortness of breath on exertion    Type 2 diabetes mellitus (HCC)    Past Surgical History:  Procedure Laterality Date   CESAREAN SECTION  1993   COLONOSCOPY  10/22/2019   2015   TONSILLECTOMY  1962   TUBAL LIGATION  1993   Family History  Problem Relation Age of Onset   Obesity Mother    Sleep apnea Mother    Thyroid disease Mother    Kidney disease Mother    Heart disease Father    Lung disease Father        fungus-Aspergillosis   Obesity Brother    Cancer Maternal Grandmother    Cancer Maternal Grandfather    Hypertension Paternal Grandfather    Diabetes Paternal Grandfather    Stroke Paternal Grandfather    Cancer Maternal Aunt  colon   Colon cancer Maternal Aunt    Esophageal cancer Maternal Aunt    Coronary artery disease Other 46       female   Colon cancer Other    Heart disease Other    Kidney disease Other    Sleep apnea Other    Obesity Other    Rectal cancer Neg Hx    Stomach cancer Neg Hx    Social History   Socioeconomic History   Marital status: Widowed    Spouse name: Not on file   Number of children: 1   Years of education: Not on file   Highest education level: Not on file  Occupational History   Occupation: Retired  Tobacco Use   Smoking status: Former    Packs/day: 1.00    Years: 30.00    Total  pack years: 30.00    Types: Cigarettes    Start date: 01/02/1978    Quit date: 01/03/2008    Years since quitting: 13.7   Smokeless tobacco: Never   Tobacco comments:    uses e cigarettes  Vaping Use   Vaping Use: Every day   Start date: 01/02/2014   Substances: Nicotine   Devices: Vape  Substance and Sexual Activity   Alcohol use: Never    Comment: rare   Drug use: No   Sexual activity: Not Currently  Other Topics Concern   Not on file  Social History Narrative   Not on file   Social Determinants of Health   Financial Resource Strain: Low Risk  (09/19/2021)   Overall Financial Resource Strain (CARDIA)    Difficulty of Paying Living Expenses: Not hard at all  Food Insecurity: No [place] (09/19/2021)   Hunger Vital Sign    Worried About Running Out of Food in the Last Year: Never true    [place] in the Last Year: Never true  Transportation Needs: No Transportation Needs (09/19/2021)   PRAPARE - Hydrologist (Medical): No    Lack of Transportation (Non-Medical): No  Physical Activity: Insufficiently Active (09/19/2021)   Exercise Vital Sign    Days of Exercise per Week: 3 days    Minutes of Exercise per Session: 30 min  Stress: No Stress Concern Present (09/19/2021)   [place]    Feeling of Stress : Not at all  Social Connections: Socially Isolated (09/19/2021)   Social Connection and Isolation Panel [NHANES]    Frequency of Communication with Friends and Family: More than three times a week    Frequency of Social Gatherings with Friends and Family: More than three times a week    Attends Religious Services: Never    Marine scientist or Organizations: No    Attends Archivist Meetings: Never    Marital Status: Widowed    Tobacco Counseling Counseling given: Not Answered Tobacco comments: uses e cigarettes   Clinical Intake:  Pre-visit  preparation completed: Yes  Pain : No/denies pain     BMI - recorded: 30.13 Nutritional Status: BMI > 30  Obese Nutritional Risks: None Diabetes: Yes CBG done?: No Did pt. bring in CBG monitor from home?: No (phone visit)  How often do you need to have someone help you when you read instructions, pamphlets, or other written materials from your doctor or pharmacy?: 1 - Never  Diabetes:  Is the patient diabetic?  Yes  If diabetic, was a CBG obtained  today?  No  Did the patient bring in their glucometer from home?  No phone visit How often do you monitor your CBG's? occasionally.   Financial Strains and Diabetes Management:  Are you having any financial strains with the device, your supplies or your medication? No .  Does the patient want to be seen by Chronic Care Management for management of their diabetes?  No  Would the patient like to be referred to a Nutritionist or for Diabetic Management?  No   Diabetic Exams:  Diabetic Eye Exam: Completed 3 months ago-per patient. Awaiting notes Diabetic Foot Exam: Pt has been advised about the importance in completing this exam. To be completed by PCP.   Interpreter Needed?: No  Information entered by :: Caroleen Hamman LPN   Activities of Daily Living    09/19/2021   12:45 PM 09/18/2021    2:20 PM  In your present state of health, do you have any difficulty performing the following activities:  Hearing? 0 0  Vision? 0 0  Difficulty concentrating or making decisions? 0 0  Walking or climbing stairs? 0 0  Dressing or bathing? 0 0  Doing errands, shopping? 0 0  Preparing Food and eating ? N N  Using the Toilet? N N  In the past six months, have you accidently leaked urine? N N  Do you have problems with loss of bowel control? N N  Managing your Medications? N N  Managing your Finances? N N  Housekeeping or managing your Housekeeping? N N    Patient Care Team: Carollee Herter, Alferd Apa, DO as PCP - General  Indicate any  recent Medical Services you may have received from other than [facility] providers in the past year (date may be approximate).     Assessment:   This is a routine wellness examination for [facility].  Hearing/Vision screen Hearing Screening - Comments:: Mild hearing loss in left ear Vision Screening - Comments:: Last eye exam-3 months ago-[facility]  Dietary issues and exercise activities discussed: Current Exercise Habits: Home exercise routine, Time (Minutes): 30, Frequency (Times/Week): 3, Weekly Exercise (Minutes/Week): 90, Intensity: Mild   Goals Addressed             This Visit's Progress    Patient Stated       Increase activity & lose some weight       Depression Screen    09/19/2021   12:45 PM 10/28/2020    8:20 AM 03/01/2020    1:07 PM 08/24/2016   10:24 AM 12/11/2014   11:19 AM 06/28/2012    8:36 AM  PHQ 2/9 Scores  PHQ - 2 Score 0 3 0 0 0 0  PHQ- 9 Score  5 0 0      Fall Risk    09/19/2021   12:44 PM 09/18/2021    2:20 PM  [place] in the past year? 0 0  Number falls in past yr: 0 0  Injury with Fall? 0 0  Follow up Falls prevention discussed     [place]:  Any stairs in or around the home? No  Home free of loose throw rugs in walkways, pet beds, electrical cords, etc? Yes  Adequate lighting in your home to reduce risk of falls? Yes   ASSISTIVE DEVICES UTILIZED TO PREVENT FALLS:  Life alert? No  Use of a cane, walker or w/c? No  Grab bars in the bathroom? Yes  Shower chair or  bench in shower? No  Elevated toilet seat or a handicapped toilet? No   TIMED UP AND GO:  Was the test performed? No . Phone visit   Cognitive Function:Normal cognitive status assessed by this Nurse Health Advisor. No abnormalities found.         09/19/2021   12:53 PM  6CIT Screen  What Year? 0 points  What month? 0 points  What time? 0 points  Count back from 20 0 points  Months in reverse 0 points  Repeat phrase 2 points   Total Score 2 points    Immunizations Immunization History  Administered Date(s) Administered   Influenza Split 01/11/2011   Influenza Whole 10/03/2007   Influenza,inj,Quad PF,6+ Mos 12/11/2014, 11/04/2015, 10/29/2018, 12/18/2019   Influenza-Unspecified 10/02/2013   PFIZER(Purple Top)SARS-COV-2 Vaccination 03/17/2019, 04/17/2019, 12/18/2019   Pneumococcal Polysaccharide-23 12/02/2013, 03/01/2020   Td 09/06/2000   Tdap 06/15/2011   Zoster Recombinat (Shingrix) 09/14/2019, 03/01/2020    TDAP status: Due, Education has been provided regarding the importance of this vaccine. Advised may receive this vaccine at local pharmacy or Health Dept. Aware to provide a copy of the vaccination record if obtained from local pharmacy or Health Dept. Verbalized acceptance and understanding.  Flu Vaccine status: Due, Education has been provided regarding the importance of this vaccine. Advised may receive this vaccine at local pharmacy or Health Dept. Aware to provide a copy of the vaccination record if obtained from local pharmacy or Health Dept. Verbalized acceptance and understanding.  Pneumococcal vaccine status: Due, Education has been provided regarding the importance of this vaccine. Advised may receive this vaccine at local pharmacy or Health Dept. Aware to provide a copy of the vaccination record if obtained from local pharmacy or Health Dept. Verbalized acceptance and understanding.  Covid-19 vaccine status: Information provided on how to obtain vaccines.   Qualifies for Shingles Vaccine? No   Zostavax completed No   Shingrix Completed?: Yes  Screening Tests Health Maintenance  Topic Date Due   Hepatitis C Screening  Never done   OPHTHALMOLOGY EXAM  09/03/2013   COVID-19 Vaccine (4 - Pfizer risk series) 02/12/2020   Pneumonia Vaccine 21+ Years old (2 - PCV) 03/01/2021   FOOT EXAM  03/01/2021   TETANUS/TDAP  06/14/2021   INFLUENZA VACCINE  08/02/2021   Diabetic kidney evaluation -  Urine ACR  10/01/2021   HEMOGLOBIN A1C  01/27/2022   Diabetic kidney evaluation - GFR measurement  07/28/2022   MAMMOGRAM  10/19/2022   COLONOSCOPY (Pts 45-74yr Insurance coverage will need to be confirmed)  10/22/2022   DEXA SCAN  Completed   Zoster Vaccines- Shingrix  Completed   HPV VACCINES  Aged Out    Health Maintenance  Health Maintenance Due  Topic Date Due   Hepatitis C Screening  Never done   OPHTHALMOLOGY EXAM  09/03/2013   COVID-19 Vaccine (4 - Pfizer risk series) 02/12/2020   Pneumonia Vaccine 66 Years old (2 - PCV) 03/01/2021   FOOT EXAM  03/01/2021   TETANUS/TDAP  06/14/2021   INFLUENZA VACCINE  08/02/2021   Diabetic kidney evaluation - Urine ACR  10/01/2021    Colorectal cancer screening: Type of screening: Colonoscopy. Completed 10/22/2019. Repeat every 3 years  Mammogram status: Completed bilateral 10/18/2020. Repeat every year  Bone Density status: Ordered today. Pt provided with contact info and advised to call to schedule appt.  Lung Cancer Screening: (Low Dose CT Chest recommended if Age 66-80years, 30 pack-year currently smoking OR have quit w/in 15years.) does qualify.  Lung Cancer Screening Referral: ordered today  Additional Screening:  Hepatitis C Screening: does qualify; Discuss with PCP  Vision Screening: Recommended annual ophthalmology exams for early detection of glaucoma and other disorders of the eye. Is the patient up to date with their annual eye exam?  Yes  Who is the provider or what is the name of the office in which the patient attends annual eye exams? [facility]   Dental Screening: Recommended annual dental exams for proper oral hygiene  Community Resource Referral / Chronic Care Management: CRR required this visit?  No   CCM required this visit?  No      Plan:     I have personally reviewed and noted the following in the patient's chart:   Medical and social history Use of alcohol, tobacco or illicit drugs   Current medications and supplements including opioid prescriptions. Patient is not currently taking opioid prescriptions. Functional ability and status Nutritional status Physical activity Advanced directives List of other physicians Hospitalizations, surgeries, and ER visits in previous 12 months Vitals Screenings to include cognitive, depression, and falls Referrals and appointments  In addition, I have reviewed and discussed with patient certain preventive protocols, quality metrics, and best practice recommendations. A written personalized care plan for preventive services as well as general preventive health recommendations were provided to patient.   Due to this being a telephonic visit, the after visit summary with patients personalized plan was offered to patient via mail or my-chart.  Patient would like to access on my-chart.   Marta Antu, LPN   [id number]  Nurse Health Advisor  Nurse Notes: None

## 2021-09-19 NOTE — Patient Instructions (Signed)
Ms. Vickie Nichols , Thank you for taking time to complete your Medicare Wellness Visit. I appreciate your ongoing commitment to your health goals. Please review the following plan we discussed and let me know if I can assist you in the future.   Screening recommendations/referrals: Colonoscopy: Completed 10/22/2019-Due 10/22/2022 Mammogram: Ordered today. Someone will call you to schedule.  Bone Density: Ordered today. Someone will call you to schedule.  Lung cancer screening: Ordered today. Someone will call you to schedule.  Recommended yearly ophthalmology/optometry visit for glaucoma screening and checkup Recommended yearly dental visit for hygiene and checkup  Vaccinations: Influenza vaccine: Due-May obtain vaccine at our office or your local pharmacy. Pneumococcal vaccine: Due-May obtain vaccine at our office or your local pharmacy. Tdap vaccine: Due-May obtain vaccine at your local pharmacy. Shingles vaccine: Completed vaccines   Covid-19:Booster available at your local pharmacy  Advanced directives: May pick up information at your next visit  Conditions/risks identified: See problem list  Next appointment: Follow up in one year for your annual wellness visit    Preventive Care 65 Years and Older, Female Preventive care refers to lifestyle choices and visits with your health care provider that can promote health and wellness. What does preventive care include? A yearly physical exam. This is also called an annual well check. Dental exams once or twice a year. Routine eye exams. Ask your health care provider how often you should have your eyes checked. Personal lifestyle choices, including: Daily care of your teeth and gums. Regular physical activity. Eating a healthy diet. Avoiding tobacco and drug use. Limiting alcohol use. Practicing safe sex. Taking low-dose aspirin every day. Taking vitamin and mineral supplements as recommended by your health care provider. What  happens during an annual well check? The services and screenings done by your health care provider during your annual well check will depend on your age, overall health, lifestyle risk factors, and family history of disease. Counseling  Your health care provider may ask you questions about your: Alcohol use. Tobacco use. Drug use. Emotional well-being. Home and relationship well-being. Sexual activity. Eating habits. History of falls. Memory and ability to understand (cognition). Work and work Statistician. Reproductive health. Screening  You may have the following tests or measurements: Height, weight, and BMI. Blood pressure. Lipid and cholesterol levels. These may be checked every 5 years, or more frequently if you are over 72 years old. Skin check. Lung cancer screening. You may have this screening every year starting at age 61 if you have a 30-pack-year history of smoking and currently smoke or have quit within the past 15 years. Fecal occult blood test (FOBT) of the stool. You may have this test every year starting at age 52. Flexible sigmoidoscopy or colonoscopy. You may have a sigmoidoscopy every 5 years or a colonoscopy every 10 years starting at age 70. Hepatitis C blood test. Hepatitis B blood test. Sexually transmitted disease (STD) testing. Diabetes screening. This is done by checking your blood sugar (glucose) after you have not eaten for a while (fasting). You may have this done every 1-3 years. Bone density scan. This is done to screen for osteoporosis. You may have this done starting at age 26. Mammogram. This may be done every 1-2 years. Talk to your health care provider about how often you should have regular mammograms. Talk with your health care provider about your test results, treatment options, and if necessary, the need for more tests. Vaccines  Your health care provider may recommend certain vaccines, such as:  Influenza vaccine. This is recommended every  year. Tetanus, diphtheria, and acellular pertussis (Tdap, Td) vaccine. You may need a Td booster every 10 years. Zoster vaccine. You may need this after age 22. Pneumococcal 13-valent conjugate (PCV13) vaccine. One dose is recommended after age 65. Pneumococcal polysaccharide (PPSV23) vaccine. One dose is recommended after age 84. Talk to your health care provider about which screenings and vaccines you need and how often you need them. This information is not intended to replace advice given to you by your health care provider. Make sure you discuss any questions you have with your health care provider. Document Released: 01/15/2015 Document Revised: 09/08/2015 Document Reviewed: 10/20/2014 Elsevier Interactive Patient Education  2017 [place] Prevention in the Home Falls can cause injuries. They can happen to people of all ages. There are many things you can do to make your home safe and to help prevent falls. What can I do on the outside of my home? Regularly fix the edges of walkways and driveways and fix any cracks. Remove anything that might make you trip as you walk through a door, such as a raised step or threshold. Trim any bushes or trees on the path to your home. Use bright outdoor lighting. Clear any walking paths of anything that might make someone trip, such as rocks or tools. Regularly check to see if handrails are loose or broken. Make sure that both sides of any steps have handrails. Any raised decks and porches should have guardrails on the edges. Have any leaves, snow, or ice cleared regularly. Use sand or salt on walking paths during winter. Clean up any spills in your garage right away. This includes oil or grease spills. What can I do in the bathroom? Use night lights. Install grab bars by the toilet and in the tub and shower. Do not use towel bars as grab bars. Use non-skid mats or decals in the tub or shower. If you need to sit down in the shower, use a  plastic, non-slip stool. Keep the floor dry. Clean up any water that spills on the floor as soon as it happens. Remove soap buildup in the tub or shower regularly. Attach bath mats securely with double-sided non-slip rug tape. Do not have throw rugs and other things on the floor that can make you trip. What can I do in the bedroom? Use night lights. Make sure that you have a light by your bed that is easy to reach. Do not use any sheets or blankets that are too big for your bed. They should not hang down onto the floor. Have a firm chair that has side arms. You can use this for support while you get dressed. Do not have throw rugs and other things on the floor that can make you trip. What can I do in the kitchen? Clean up any spills right away. Avoid walking on wet floors. Keep items that you use a lot in easy-to-reach places. If you need to reach something above you, use a strong step stool that has a grab bar. Keep electrical cords out of the way. Do not use floor polish or wax that makes floors slippery. If you must use wax, use non-skid floor wax. Do not have throw rugs and other things on the floor that can make you trip. What can I do with my stairs? Do not leave any items on the stairs. Make sure that there are handrails on both sides of the stairs and use them.  Fix handrails that are broken or loose. Make sure that handrails are as long as the stairways. Check any carpeting to make sure that it is firmly attached to the stairs. Fix any carpet that is loose or worn. Avoid having throw rugs at the top or bottom of the stairs. If you do have throw rugs, attach them to the floor with carpet tape. Make sure that you have a light switch at the top of the stairs and the bottom of the stairs. If you do not have them, ask someone to add them for you. What else can I do to help prevent falls? Wear shoes that: Do not have high heels. Have rubber bottoms. Are comfortable and fit you  well. Are closed at the toe. Do not wear sandals. If you use a stepladder: Make sure that it is fully opened. Do not climb a closed stepladder. Make sure that both sides of the stepladder are locked into place. Ask someone to hold it for you, if possible. Clearly mark and make sure that you can see: Any grab bars or handrails. First and last steps. Where the edge of each step is. Use tools that help you move around (mobility aids) if they are needed. These include: Canes. Walkers. Scooters. Crutches. Turn on the lights when you go into a dark area. Replace any light bulbs as soon as they burn out. Set up your furniture so you have a clear path. Avoid moving your furniture around. If any of your floors are uneven, fix them. If there are any pets around you, be aware of where they are. Review your medicines with your doctor. Some medicines can make you feel dizzy. This can increase your chance of falling. Ask your doctor what other things that you can do to help prevent falls. This information is not intended to replace advice given to you by your health care provider. Make sure you discuss any questions you have with your health care provider. Document Released: 10/15/2008 Document Revised: 05/27/2015 Document Reviewed: 01/23/2014 Elsevier Interactive Patient Education  2017 [employer].

## 2021-09-19 NOTE — Telephone Encounter (Signed)
Patient is requesting a dermatology referral for a skin cancer screening.

## 2021-09-20 ENCOUNTER — Other Ambulatory Visit: Payer: Self-pay | Admitting: Family

## 2021-09-20 DIAGNOSIS — Z1283 Encounter for screening for malignant neoplasm of skin: Secondary | ICD-10-CM

## 2021-09-30 ENCOUNTER — Encounter: Payer: Self-pay | Admitting: Nurse Practitioner

## 2021-10-03 NOTE — Telephone Encounter (Signed)
Appt has been cancelled.  

## 2021-10-04 ENCOUNTER — Ambulatory Visit: Payer: Self-pay | Admitting: Nurse Practitioner

## 2021-10-15 ENCOUNTER — Other Ambulatory Visit: Payer: Self-pay | Admitting: Family Medicine

## 2021-10-20 ENCOUNTER — Other Ambulatory Visit: Payer: Self-pay | Admitting: Family Medicine

## 2021-10-20 DIAGNOSIS — E1169 Type 2 diabetes mellitus with other specified complication: Secondary | ICD-10-CM

## 2021-10-25 ENCOUNTER — Ambulatory Visit
Admission: RE | Admit: 2021-10-25 | Discharge: 2021-10-25 | Disposition: A | Discharge status: Home / Self Care | Payer: Medicare Other | Source: Ambulatory Visit | Attending: Family Medicine | Admitting: Family Medicine

## 2021-10-25 ENCOUNTER — Other Ambulatory Visit: Payer: Medicare Other

## 2021-10-25 DIAGNOSIS — Z1231 Encounter for screening mammogram for malignant neoplasm of breast: Secondary | ICD-10-CM

## 2021-10-25 DIAGNOSIS — Z780 Asymptomatic menopausal state: Secondary | ICD-10-CM

## 2021-10-26 ENCOUNTER — Encounter (INDEPENDENT_AMBULATORY_CARE_PROVIDER_SITE_OTHER): Payer: Self-pay | Admitting: Family Medicine

## 2021-10-26 ENCOUNTER — Ambulatory Visit (INDEPENDENT_AMBULATORY_CARE_PROVIDER_SITE_OTHER): Payer: Medicare Other | Admitting: Family Medicine

## 2021-10-26 VITALS — BP 142/74 | HR 77 | Temp 98.9°F | Ht 69.0 in | Wt 204.0 lb

## 2021-10-26 DIAGNOSIS — E559 Vitamin D deficiency, unspecified: Secondary | ICD-10-CM

## 2021-10-26 DIAGNOSIS — Z6833 Body mass index (BMI) 33.0-33.9, adult: Secondary | ICD-10-CM

## 2021-10-26 DIAGNOSIS — Z7984 Long term (current) use of oral hypoglycemic drugs: Secondary | ICD-10-CM

## 2021-10-26 DIAGNOSIS — E669 Obesity, unspecified: Secondary | ICD-10-CM | POA: Diagnosis not present

## 2021-10-26 DIAGNOSIS — Z636 Dependent relative needing care at home: Secondary | ICD-10-CM | POA: Diagnosis not present

## 2021-10-26 DIAGNOSIS — E1169 Type 2 diabetes mellitus with other specified complication: Secondary | ICD-10-CM

## 2021-10-26 MED ORDER — VITAMIN D (ERGOCALCIFEROL) 1.25 MG (50000 UNIT) PO CAPS: 50000.0000 [IU] | ORAL_CAPSULE | ORAL | 0 refills | Status: DC

## 2021-11-08 NOTE — Progress Notes (Signed)
Chief Complaint:   OBESITY Vickie Nichols is here to discuss her progress with her obesity treatment plan along with follow-up of her obesity related diagnoses. Vickie Nichols is on the Category 2 Plan with breakfast and lunch options and states she is following her eating plan approximately 30% of the time. Vickie Nichols states she is doing 0 minutes 0 times per week.  Today's visit was #: 20 Starting weight: 219 lbs Starting date: 10/28/2020 Today's weight: 204 lbs Today's date: 10/26/2021 Total lbs lost to date: 15 Total lbs lost since last in-office visit: 0  Interim History: Vickie Nichols states she has been so stressed.  Her elderly mom (46 years old) and elderly aunt lives alone together in [place], and she is under a lot of caregiver stress.  Subjective:   1. Caregiver stress Patient's PCP has her on Wellbutrin XL 300 mg.  2. Vitamin D deficiency She is currently taking prescription vitamin D 50,000 IU each week. She denies nausea, vomiting or muscle weakness.  3. Type 2 diabetes mellitus with other specified complication, without long-term current use of insulin (HCC) Patient's A1c is well controlled on metformin.  Her last A1c was 6.23 months ago.  Assessment/Plan:  No orders of the defined types were placed in this encounter.   Medications Discontinued During This Encounter  Medication Reason   Vitamin D, Ergocalciferol, (DRISDOL) 1.25 MG (50000 UNIT) CAPS capsule Reorder     Meds ordered this encounter  Medications   Vitamin D, Ergocalciferol, (DRISDOL) 1.25 MG (50000 UNIT) CAPS capsule    Sig: Take 1 capsule (50,000 Units total) by mouth every 7 (seven) days.    Dispense:  4 capsule    Refill:  0    30 d supply;  ** OV for RF **   Do not send RF request     1. Caregiver stress I recommended the patient to follow-up with her PCP in the near future to discuss possible change in her medications if needed.  She will continue her medications per her PCP.  She is to add exercise  once daily.  Boundary setting and self-care were discussed with the patient, and she needs to put herself first.  2. Vitamin D deficiency Low Vitamin D level contributes to fatigue and are associated with obesity, breast, and colon cancer. She will continue prescription Vitamin D 50,000 IU every week and we will refill for 1 month. She will follow-up for routine testing of Vitamin D, at least 2-3 times per year to avoid over-replacement.  - Vitamin D, Ergocalciferol, (DRISDOL) 1.25 MG (50000 UNIT) CAPS capsule; Take 1 capsule (50,000 Units total) by mouth every 7 (seven) days.  Dispense: 4 capsule; Refill: 0  3. Type 2 diabetes mellitus with other specified complication, without long-term current use of insulin (Vickie Nichols) Patient will continue metformin per her PCP.  She will continue her prudent nutritional plan, weight loss, and increase her exercise.  4. Obesity with current BMI of 31.1 Vickie Nichols is currently in the action stage of change. As such, her goal is to continue with weight loss efforts. She has agreed to the Category 2 Plan with breakfast and lunch options.   Patient's goal is to walk 30 minutes every day to help with stress.  Exercise goals: All adults should avoid inactivity. Some physical activity is better than none, and adults who participate in any amount of physical activity gain some health benefits.  Behavioral modification strategies: increasing lean protein intake and increasing water intake.  Vickie Nichols has agreed to  follow-up with our clinic in 3 weeks. She was informed of the importance of frequent follow-up visits to maximize her success with intensive lifestyle modifications for her multiple health conditions.   Objective:   Blood pressure (!) 142/74, pulse 77, temperature 98.9 F (37.2 C), height '5\' 9"'$  (1.753 m), weight 204 lb (92.5 kg), SpO2 97 %. Body mass index is 30.13 kg/m.  General: Cooperative, alert, well developed, in no acute distress. HEENT: Conjunctivae  and lids unremarkable. Cardiovascular: Regular rhythm.  Lungs: Normal work of breathing. Neurologic: No focal deficits.   Lab Results  Component Value Date   CREATININE 0.85 07/27/2021   BUN 11 07/27/2021   NA 145 (H) 07/27/2021   K 4.7 07/27/2021   CL 105 07/27/2021   CO2 26 07/27/2021   Lab Results  Component Value Date   ALT 15 07/27/2021   AST 21 07/27/2021   ALKPHOS 73 07/27/2021   BILITOT 0.3 07/27/2021   Lab Results  Component Value Date   HGBA1C 6.2 (H) 07/27/2021   HGBA1C 6.1 (H) 01/31/2021   HGBA1C 7.1 (H) 10/01/2020   HGBA1C 6.5 06/21/2018   HGBA1C 6.0 08/24/2016   Lab Results  Component Value Date   INSULIN 17.1 07/27/2021   INSULIN 24.5 02/21/2021   INSULIN 19.5 10/28/2020   Lab Results  Component Value Date   TSH 1.400 10/28/2020   Lab Results  Component Value Date   CHOL 127 07/27/2021   HDL 51 07/27/2021   LDLCALC 52 07/27/2021   LDLDIRECT 169.3 06/28/2012   TRIG 142 07/27/2021   CHOLHDL 4 06/21/2018   Lab Results  Component Value Date   VD25OH 47.5 07/27/2021   VD25OH 47.4 02/21/2021   VD25OH 23.3 (L) 10/28/2020   Lab Results  Component Value Date   WBC 8.4 07/27/2021   HGB 13.7 07/27/2021   HCT 40.9 07/27/2021   MCV 93 07/27/2021   PLT 261 07/27/2021   No results found for: "IRON", "TIBC", "FERRITIN"  Obesity Behavioral Intervention:   Approximately 15 minutes were spent on the discussion below.  ASK: We discussed the diagnosis of obesity with Vickie Nichols today and Vickie Nichols agreed to give [place] permission to discuss obesity behavioral modification therapy today.  ASSESS: Vickie Nichols has the diagnosis of obesity and her BMI today is 31.1. Vickie Nichols is in the action stage of change.   ADVISE: Vickie Nichols was educated on the multiple health risks of obesity as well as the benefit of weight loss to improve her health. She was advised of the need for long term treatment and the importance of lifestyle modifications to improve her current  health and to decrease her risk of future health problems.  AGREE: Multiple dietary modification options and treatment options were discussed and Vickie Nichols agreed to follow the recommendations documented in the above note.  ARRANGE: Vickie Nichols was educated on the importance of frequent visits to treat obesity as outlined per CMS and USPSTF guidelines and agreed to schedule her next follow up appointment today.  Attestation Statements:   Reviewed by clinician on day of visit: allergies, medications, problem list, medical history, surgical history, family history, social history, and previous encounter notes.   Wilhemena Durie, am acting as transcriptionist for [employer], DO.  I have reviewed the above documentation for accuracy and completeness, and I agree with the above. Marjory Sneddon, D.O.  The [place] was signed into law in 2016 which includes the topic of electronic health records.  This provides immediate access to information in MyChart.  This includes consultation notes, operative notes, office notes, lab results and pathology reports.  If you have any questions about what you read please let us know at your next visit so we can discuss your concerns and take corrective action if need be.  We are right here with you.

## 2021-11-16 ENCOUNTER — Ambulatory Visit (INDEPENDENT_AMBULATORY_CARE_PROVIDER_SITE_OTHER): Payer: Medicare Other | Admitting: Internal Medicine

## 2021-12-07 ENCOUNTER — Ambulatory Visit (INDEPENDENT_AMBULATORY_CARE_PROVIDER_SITE_OTHER): Payer: Medicare Other | Admitting: Family Medicine

## 2021-12-20 ENCOUNTER — Ambulatory Visit (INDEPENDENT_AMBULATORY_CARE_PROVIDER_SITE_OTHER): Payer: Medicare Other | Admitting: Family Medicine

## 2021-12-20 ENCOUNTER — Encounter (INDEPENDENT_AMBULATORY_CARE_PROVIDER_SITE_OTHER): Payer: Self-pay | Admitting: Family Medicine

## 2021-12-20 VITALS — BP 146/68 | HR 61 | Temp 98.3°F | Ht 69.0 in | Wt 208.6 lb

## 2021-12-20 DIAGNOSIS — E119 Type 2 diabetes mellitus without complications: Secondary | ICD-10-CM

## 2021-12-20 DIAGNOSIS — E538 Deficiency of other specified B group vitamins: Secondary | ICD-10-CM

## 2021-12-20 DIAGNOSIS — Z6830 Body mass index (BMI) 30.0-30.9, adult: Secondary | ICD-10-CM

## 2021-12-20 DIAGNOSIS — E669 Obesity, unspecified: Secondary | ICD-10-CM

## 2021-12-20 DIAGNOSIS — E559 Vitamin D deficiency, unspecified: Secondary | ICD-10-CM

## 2021-12-20 DIAGNOSIS — Z6833 Body mass index (BMI) 33.0-33.9, adult: Secondary | ICD-10-CM

## 2021-12-20 DIAGNOSIS — E1169 Type 2 diabetes mellitus with other specified complication: Secondary | ICD-10-CM | POA: Diagnosis not present

## 2021-12-20 DIAGNOSIS — Z7984 Long term (current) use of oral hypoglycemic drugs: Secondary | ICD-10-CM

## 2021-12-20 MED ORDER — VITAMIN D (ERGOCALCIFEROL) 1.25 MG (50000 UNIT) PO CAPS: 50000.0000 [IU] | ORAL_CAPSULE | ORAL | 0 refills | Status: DC

## 2022-01-16 NOTE — Progress Notes (Signed)
Chief Complaint:   OBESITY Vickie Nichols is here to discuss her progress with her obesity treatment plan along with follow-up of her obesity related diagnoses. Vickie Nichols is on the Category 2 Plan with breakfast and lunch options and states she is following her eating plan approximately 0% of the time. Vickie Nichols states she is yard work 3 to 5 hours 2 times per week.  Today's visit was #: 21 Starting weight: 42 LBS Starting date: 10/28/2020 Today's weight: 208 LBS Today's date: 12/20/2021 Total lbs lost to date: 11 LBS Total lbs lost since last in-office visit: +4 LBS  Interim History: " I cannot seem to get back on track. I am disappointed in myself."  Subjective:   1. Type 2 diabetes mellitus with obesity (HCC) Last A1c 6.24 months ago.  Patient not checking blood sugars at home.  She is taking 1000 mg metformin daily per PCP.  2. Vitamin B12 deficiency Patient ran out of OTC B12 about 3 weeks ago.  She will obtain more.  Patient is taking vitamin B12 300 to 500 mcg daily.  3. Vitamin D deficiency Last vitamin D level 47.5 on 10/25/2021.  Bone density done and results reviewed with patient today.  T-score of 0.9, osteopenic.  Assessment/Plan:  No orders of the defined types were placed in this encounter.   Medications Discontinued During This Encounter  Medication Reason   Vitamin D, Ergocalciferol, (DRISDOL) 1.25 MG (50000 UNIT) CAPS capsule Reorder     Meds ordered this encounter  Medications   DISCONTD: Vitamin D, Ergocalciferol, (DRISDOL) 1.25 MG (50000 UNIT) CAPS capsule    Sig: Take 1 capsule (50,000 Units total) by mouth every 7 (seven) days.    Dispense:  4 capsule    Refill:  0    30 d supply;  ** OV for RF **   Do not send RF request     1. Type 2 diabetes mellitus with obesity (HCC) Consider medication additions in the new year.  Check fasting insulin and A1c next office visit along with fasting lipid panel and CMP.  Continue metformin per PCP and continue  PNP.  2. Vitamin B12 deficiency Check B12 level, continue PNP.  3. Vitamin D deficiency Counseling done on osteopenia, continue calcium rich meal planning and refill ergocalciferol.  Refill- Vitamin D, Ergocalciferol, (DRISDOL) 1.25 MG (50000 UNIT) CAPS capsule; Take 1 capsule (50,000 Units total) by mouth every 7 (seven) days.  Dispense: 4 capsule; Refill: 0  4. Obesity with current BMI of 30.8 Patient pays for [facility] well $55 per month and has not been in 2 to 3 months.  Her goal is to go there 3 days/week at 5 PM.  Goal is to not gain.  Vickie Nichols is currently in the action stage of change. As such, her goal is to continue with weight loss efforts. She has agreed to the Category 2 Plan with breakfast and lunch options.   Exercise goals:  As is.  Behavioral modification strategies: increasing lean protein intake and holiday eating strategies .  Vickie Nichols has agreed to follow-up with our clinic in 3-4 weeks. She was informed of the importance of frequent follow-up visits to maximize her success with intensive lifestyle modifications for her multiple health conditions.   Objective:   Blood pressure (!) 146/68, pulse 61, temperature 98.3 F (36.8 C), height '5\' 9"'$  (1.753 m), weight 208 lb 9.6 oz (94.6 kg), SpO2 98 %. Body mass index is 30.8 kg/m.  General: Cooperative, alert, well developed, in no acute  distress. HEENT: Conjunctivae and lids unremarkable. Cardiovascular: Regular rhythm.  Lungs: Normal work of breathing. Neurologic: No focal deficits.   Lab Results  Component Value Date   CREATININE 0.85 07/27/2021   BUN 11 07/27/2021   NA 145 (H) 07/27/2021   K 4.7 07/27/2021   CL 105 07/27/2021   CO2 26 07/27/2021   Lab Results  Component Value Date   ALT 15 07/27/2021   AST 21 07/27/2021   ALKPHOS 73 07/27/2021   BILITOT 0.3 07/27/2021   Lab Results  Component Value Date   HGBA1C 6.2 (H) 07/27/2021   HGBA1C 6.1 (H) 01/31/2021   HGBA1C 7.1 (H) 10/01/2020   HGBA1C 6.5  06/21/2018   HGBA1C 6.0 08/24/2016   Lab Results  Component Value Date   INSULIN 17.1 07/27/2021   INSULIN 24.5 02/21/2021   INSULIN 19.5 10/28/2020   Lab Results  Component Value Date   TSH 1.400 10/28/2020   Lab Results  Component Value Date   CHOL 127 07/27/2021   HDL 51 07/27/2021   LDLCALC 52 07/27/2021   LDLDIRECT 169.3 06/28/2012   TRIG 142 07/27/2021   CHOLHDL 4 06/21/2018   Lab Results  Component Value Date   VD25OH 47.5 07/27/2021   VD25OH 47.4 02/21/2021   VD25OH 23.3 (L) 10/28/2020   Lab Results  Component Value Date   WBC 8.4 07/27/2021   HGB 13.7 07/27/2021   HCT 40.9 07/27/2021   MCV 93 07/27/2021   PLT 261 07/27/2021   No results found for: "IRON", "TIBC", "FERRITIN"   Attestation Statements:   Reviewed by clinician on day of visit: allergies, medications, problem list, medical history, surgical history, family history, social history, and previous encounter notes.  Time spent on visit including pre-visit chart review and post-visit care and charting was 40 minutes.   I, Davy Pique, RMA, am acting as Location manager for [employer], DO.   I have reviewed the above documentation for accuracy and completeness, and I agree with the above. Marjory Sneddon, D.O.  The [place] was signed into law in 2016 which includes the topic of electronic health records.  This provides immediate access to information in MyChart.  This includes consultation notes, operative notes, office notes, lab results and pathology reports.  If you have any questions about what you read please let us know at your next visit so we can discuss your concerns and take corrective action if need be.  We are right here with you.

## 2022-01-18 ENCOUNTER — Encounter (INDEPENDENT_AMBULATORY_CARE_PROVIDER_SITE_OTHER): Payer: Self-pay | Admitting: Family Medicine

## 2022-01-18 ENCOUNTER — Other Ambulatory Visit: Payer: Self-pay | Admitting: Family Medicine

## 2022-01-18 ENCOUNTER — Ambulatory Visit (INDEPENDENT_AMBULATORY_CARE_PROVIDER_SITE_OTHER): Payer: Medicare Other | Admitting: Family Medicine

## 2022-01-18 VITALS — BP 134/77 | HR 78 | Temp 98.1°F | Ht 69.0 in | Wt 210.6 lb

## 2022-01-18 DIAGNOSIS — R0602 Shortness of breath: Secondary | ICD-10-CM | POA: Diagnosis not present

## 2022-01-18 DIAGNOSIS — E559 Vitamin D deficiency, unspecified: Secondary | ICD-10-CM | POA: Diagnosis not present

## 2022-01-18 DIAGNOSIS — E669 Obesity, unspecified: Secondary | ICD-10-CM

## 2022-01-18 DIAGNOSIS — E1169 Type 2 diabetes mellitus with other specified complication: Secondary | ICD-10-CM | POA: Diagnosis not present

## 2022-01-18 DIAGNOSIS — Z7984 Long term (current) use of oral hypoglycemic drugs: Secondary | ICD-10-CM

## 2022-01-18 DIAGNOSIS — Z6831 Body mass index (BMI) 31.0-31.9, adult: Secondary | ICD-10-CM

## 2022-01-18 DIAGNOSIS — E538 Deficiency of other specified B group vitamins: Secondary | ICD-10-CM

## 2022-01-18 MED ORDER — VITAMIN D (ERGOCALCIFEROL) 1.25 MG (50000 UNIT) PO CAPS: 50000.0000 [IU] | ORAL_CAPSULE | ORAL | 0 refills | Status: DC

## 2022-01-19 ENCOUNTER — Encounter: Payer: Self-pay | Admitting: Family Medicine

## 2022-01-19 DIAGNOSIS — E1169 Type 2 diabetes mellitus with other specified complication: Secondary | ICD-10-CM

## 2022-01-19 LAB — COMPREHENSIVE METABOLIC PANEL
ALT: 25 IU/L (ref 0–32)
AST: 26 IU/L (ref 0–40)
Albumin/Globulin Ratio: 2 (ref 1.2–2.2)
Albumin: 4.6 g/dL (ref 3.9–4.9)
Alkaline Phosphatase: 82 IU/L (ref 44–121)
BUN/Creatinine Ratio: 17 (ref 12–28)
BUN: 14 mg/dL (ref 8–27)
Bilirubin Total: 0.3 mg/dL (ref 0.0–1.2)
CO2: 24 mmol/L (ref 20–29)
Calcium: 9.7 mg/dL (ref 8.7–10.3)
Chloride: 103 mmol/L (ref 96–106)
Creatinine, Ser: 0.82 mg/dL (ref 0.57–1.00)
Globulin, Total: 2.3 g/dL (ref 1.5–4.5)
Glucose: 111 mg/dL — ABNORMAL HIGH (ref 70–99)
Potassium: 4.7 mmol/L (ref 3.5–5.2)
Sodium: 141 mmol/L (ref 134–144)
Total Protein: 6.9 g/dL (ref 6.0–8.5)
eGFR: 79 mL/min/{1.73_m2} (ref >59)

## 2022-01-19 LAB — VITAMIN D 25 HYDROXY (VIT D DEFICIENCY, FRACTURES): Vit D, 25-Hydroxy: 38.2 ng/mL (ref 30.0–100.0)

## 2022-01-19 LAB — HEMOGLOBIN A1C
Est. average glucose Bld gHb Est-mCnc: 137 mg/dL
Hgb A1c MFr Bld: 6.4 % — ABNORMAL HIGH (ref 4.8–5.6)

## 2022-01-19 LAB — INSULIN, RANDOM: INSULIN: 16.6 u[IU]/mL (ref 2.6–24.9)

## 2022-01-19 LAB — VITAMIN B12: Vitamin B-12: 654 pg/mL (ref 232–1245)

## 2022-01-19 MED ORDER — ROSUVASTATIN CALCIUM 5 MG PO TABS: ORAL_TABLET | ORAL | 0 refills | Status: DC

## 2022-01-22 ENCOUNTER — Other Ambulatory Visit (INDEPENDENT_AMBULATORY_CARE_PROVIDER_SITE_OTHER): Payer: Self-pay | Admitting: Family Medicine

## 2022-01-22 DIAGNOSIS — E559 Vitamin D deficiency, unspecified: Secondary | ICD-10-CM

## 2022-02-02 ENCOUNTER — Ambulatory Visit (INDEPENDENT_AMBULATORY_CARE_PROVIDER_SITE_OTHER): Payer: Medicare Other | Admitting: Family Medicine

## 2022-02-02 ENCOUNTER — Encounter (INDEPENDENT_AMBULATORY_CARE_PROVIDER_SITE_OTHER): Payer: Self-pay | Admitting: Family Medicine

## 2022-02-02 VITALS — BP 136/71 | HR 61 | Temp 98.5°F | Ht 69.0 in | Wt 208.4 lb

## 2022-02-02 DIAGNOSIS — E538 Deficiency of other specified B group vitamins: Secondary | ICD-10-CM

## 2022-02-02 DIAGNOSIS — E1169 Type 2 diabetes mellitus with other specified complication: Secondary | ICD-10-CM

## 2022-02-02 DIAGNOSIS — E559 Vitamin D deficiency, unspecified: Secondary | ICD-10-CM

## 2022-02-02 DIAGNOSIS — E669 Obesity, unspecified: Secondary | ICD-10-CM

## 2022-02-02 DIAGNOSIS — K760 Fatty (change of) liver, not elsewhere classified: Secondary | ICD-10-CM

## 2022-02-02 DIAGNOSIS — Z7984 Long term (current) use of oral hypoglycemic drugs: Secondary | ICD-10-CM

## 2022-02-02 DIAGNOSIS — R638 Other symptoms and signs concerning food and fluid intake: Secondary | ICD-10-CM

## 2022-02-02 DIAGNOSIS — Z6830 Body mass index (BMI) 30.0-30.9, adult: Secondary | ICD-10-CM

## 2022-02-02 MED ORDER — VITAMIN D3 50 MCG (2000 UT) PO CAPS: 2000.0000 [IU] | ORAL_CAPSULE | Freq: Every day | ORAL | Status: DC

## 2022-02-02 MED ORDER — VITAMIN D (ERGOCALCIFEROL) 1.25 MG (50000 UNIT) PO CAPS: 50000.0000 [IU] | ORAL_CAPSULE | ORAL | 0 refills | Status: DC

## 2022-02-04 DIAGNOSIS — R638 Other symptoms and signs concerning food and fluid intake: Secondary | ICD-10-CM | POA: Insufficient documentation

## 2022-02-06 NOTE — Progress Notes (Signed)
Chief Complaint:   OBESITY Vickie Nichols is here to discuss her progress with her obesity treatment plan along with follow-up of her obesity related diagnoses. Vickie Nichols is on the Category 2 Plan with breakfast and lunch options and states she is following her eating plan approximately 50% of the time. Vickie Nichols states she is at the gym and walking for 30-40 minutes 7 times per week.  Today's visit was #: 22 Starting weight: 219 lbs Starting date: 10/28/2020 Today's weight: 210 lbs Today's date: 01/18/2022 Total lbs lost to date: 9 Total lbs lost since last in-office visit: 0  Interim History: Vickie Nichols went every day to the gym for 30 minutes of walking daily. She is feeling better and a little more focused, and she plans to focus on foods now.   Subjective:   1. Shortness of breath on exertion Vickie Nichols's IC in October 2022 was 1814. She has lost 10 lbs since then and her IC is now measured at 1699. I discussed labs with the patient today.   2. Type 2 diabetes mellitus with obesity (Vickie Nichols) Vickie Nichols is not checking her blood sugars. Last A1c was 6.1, and good control.   3. Vitamin D deficiency Vickie Nichols is taking Vitamin D as prescribed. She notes improved energy level. Last Vitamin D level was 47. I discussed labs with the patient today.   4. Vitamin B12 deficiency Vickie Nichols is taking B12 500 mcg daily. Last B12 was in the 300's, and not at goal.   Assessment/Plan:   Orders Placed This Encounter  Procedures   Comprehensive metabolic panel   Hemoglobin A1c   Insulin, random   Vitamin B12   VITAMIN D 25 Hydroxy (Vit-D Deficiency, Fractures)    Medications Discontinued During This Encounter  Medication Reason   Vitamin D, Ergocalciferol, (DRISDOL) 1.25 MG (50000 UNIT) CAPS capsule Reorder     Meds ordered this encounter  Medications   DISCONTD: Vitamin D, Ergocalciferol, (DRISDOL) 1.25 MG (50000 UNIT) CAPS capsule    Sig: Take 1 capsule (50,000 Units total) by mouth every 7  (seven) days.    Dispense:  4 capsule    Refill:  0    30 d supply;  ** OV for RF **   Do not send RF request     1. Shortness of breath on exertion Repeat IC was done today. Counseling was done with the patient on ways to increase metabolism. Continue Category 2 but only with 100 calorie snacks. Patient was educated.   2. Type 2 diabetes mellitus with obesity ([name]) We will check labs today. Patient wishes to take metformin BID and hold off on a GLP-1 for now. Risks and benefits of medications were discussed with the patient.   - Comprehensive metabolic panel - Hemoglobin A1c - Insulin, random  3. Vitamin D deficiency We will check labs today, and we will refill prescription Vitamin D 50,000 IU every week for 1 month.  - VITAMIN D 25 Hydroxy (Vit-D Deficiency, Fractures)  4. Vitamin B12 deficiency We wil check labs today. Patient will continue B12 supplementation. The diagnosis was reviewed with the patient. Counseling provided today, see below. We will continue to monitor. Orders and follow up as documented in patient record.  Counseling The body needs vitamin B12: to make red blood cells; to make DNA; and to help the nerves work properly so they can carry messages from the brain to the body.  The main causes of vitamin B12 deficiency include dietary deficiency, digestive diseases, pernicious anemia, and having  a surgery in which part of the stomach or small intestine is removed.  Certain medicines can make it harder for the body to absorb vitamin B12. These medicines include: heartburn medications; some antibiotics; some medications used to treat diabetes, gout, and high cholesterol.  In some cases, there are no symptoms of this condition. If the condition leads to anemia or nerve damage, various symptoms can occur, such as weakness or fatigue, shortness of breath, and numbness or tingling in your hands and feet.   Treatment:  May include taking vitamin B12 supplements.  Avoid  alcohol.  Eat lots of healthy foods that contain vitamin B12: Beef, pork, chicken, [place], and organ meats, such as liver.  Seafood: This includes clams, rainbow trout, salmon, tuna, and haddock. Eggs.  Cereal and dairy products that are fortified: This means that vitamin B12 has been added to the food.   - Vitamin B12  5. Obesity with current BMI of 31.1 Vickie Nichols is currently in the action stage of change. As such, her goal is to continue with weight loss efforts. She has agreed to the Category 2 Plan with breakfast and lunch options, and only 100 snack calories.   I discussed with the patient Topamax and other medications for weight loss. She would like to focus on her diet and exercise for now. She declines any additional medications.   Exercise goals: As is. Increase as tolerated with some resistance training.   Behavioral modification strategies: increasing lean protein intake.  Vickie Nichols has agreed to follow-up with our clinic in 2 to 3 weeks. She was informed of the importance of frequent follow-up visits to maximize her success with intensive lifestyle modifications for her multiple health conditions.   Vickie Nichols was informed we would discuss her lab results at her next visit unless there is a critical issue that needs to be addressed sooner. Vickie Nichols agreed to keep her next visit at the agreed upon time to discuss these results.  Objective:   Blood pressure 134/77, pulse 78, temperature 98.1 F (36.7 C), height 5' 9"$  (1.753 m), weight 210 lb 9.6 oz (95.5 kg), SpO2 99 %. Body mass index is 31.1 kg/m.  General: Cooperative, alert, well developed, in no acute distress. HEENT: Conjunctivae and lids unremarkable. Cardiovascular: Regular rhythm.  Lungs: Normal work of breathing. Neurologic: No focal deficits.   Lab Results  Component Value Date   CREATININE 0.82 01/18/2022   BUN 14 01/18/2022   NA 141 01/18/2022   K 4.7 01/18/2022   CL 103 01/18/2022   CO2 24 01/18/2022    Lab Results  Component Value Date   ALT 25 01/18/2022   AST 26 01/18/2022   ALKPHOS 82 01/18/2022   BILITOT 0.3 01/18/2022   Lab Results  Component Value Date   HGBA1C 6.4 (H) 01/18/2022   HGBA1C 6.2 (H) 07/27/2021   HGBA1C 6.1 (H) 01/31/2021   HGBA1C 7.1 (H) 10/01/2020   HGBA1C 6.5 06/21/2018   Lab Results  Component Value Date   INSULIN 16.6 01/18/2022   INSULIN 17.1 07/27/2021   INSULIN 24.5 02/21/2021   INSULIN 19.5 10/28/2020   Lab Results  Component Value Date   TSH 1.400 10/28/2020   Lab Results  Component Value Date   CHOL 127 07/27/2021   HDL 51 07/27/2021   LDLCALC 52 07/27/2021   LDLDIRECT 169.3 06/28/2012   TRIG 142 07/27/2021   CHOLHDL 4 06/21/2018   Lab Results  Component Value Date   VD25OH 38.2 01/18/2022   VD25OH 47.5 07/27/2021  VD25OH 47.4 02/21/2021   Lab Results  Component Value Date   WBC 8.4 07/27/2021   HGB 13.7 07/27/2021   HCT 40.9 07/27/2021   MCV 93 07/27/2021   PLT 261 07/27/2021   No results found for: "IRON", "TIBC", "FERRITIN"  Attestation Statements:   Reviewed by clinician on day of visit: allergies, medications, problem list, medical history, surgical history, family history, social history, and previous encounter notes.  Time spent on visit including pre-visit chart review and post-visit care and charting was 40 minutes.   Wilhemena Durie, am acting as transcriptionist for [employer], DO.   I have reviewed the above documentation for accuracy and completeness, and I agree with the above. Marjory Sneddon, D.O.  The [place] was signed into law in 2016 which includes the topic of electronic health records.  This provides immediate access to information in MyChart.  This includes consultation notes, operative notes, office notes, lab results and pathology reports.  If you have any questions about what you read please let us know at your next visit so we can discuss your concerns and take  corrective action if need be.  We are right here with you.

## 2022-02-16 ENCOUNTER — Other Ambulatory Visit: Payer: Self-pay | Admitting: Family Medicine

## 2022-02-22 NOTE — Progress Notes (Signed)
Chief Complaint:   OBESITY Vickie Nichols is here to discuss her progress with her obesity treatment plan along with follow-up of her obesity related diagnoses. Vickie Nichols is on the Category 2 Plan and states she is following her eating plan approximately 40% of the time. Vickie Nichols states she is doing treadmill and weights 30 minutes 3 times per week.  Today's visit was #: 58 Starting weight: 54 LBS Starting date: 10/28/2020 Today's weight: 208 LBS Today's date: 02/02/2022 Total lbs lost to date: 11 LBS Total lbs lost since last in-office visit: - 2 LBS  Interim History: Patient is going to the gym 3 to 4 days/week.  She is doing resistance training 15 minutes with 30 to 35 minutes on the treadmill.   - Patient likes frozen meals for breakfast and lunch.  For dinner she eats a salad with ham or [place] from grocery store.  When eating on plan, her hunger and cravings are well controlled.  However, microwave meals lack in protein.  - Patient is here today to review labs drawn on 01/18/2022.  Subjective:   1. NAFLD (nonalcoholic fatty liver disease) Discussed labs with patient today. In the past ALT was 61 and AST 46.  Now 25 and 26 respectively.  2. Vitamin D deficiency Discussed labs with patient today. Vitamin D 38.2 not at goal.  Prior 47.5.  Patient takes weekly as written, compliance good and tolerating well.  3. Diabetes mellitus type 2 in obese (HCC) Worsening.  Discussed labs with patient today.   Patient's A1c is 6.4, prior was 6.2.  Patient has decreased hunger with twice daily metformin.  Patient is taking it in the AM and afternoon; tolerating well;  patient thinks it works well.  Patient's goal is to get off all medications.  4. Vitamin B12 deficiency Discussed labs with patient today.  Vitamin B12 improved from 332 to 654.  5. Abnormal craving with emotional eating Discussed labs with patient today.  CMP within normal limits.  Patient's PCP now manages patient's  Wellbutrin dose of 300 XL daily.  Patient's caregiver stress has improved.  Patient is tolerating medication well.   Assessment/Plan:   1. NAFLD (nonalcoholic fatty liver disease) Plan: - improving levels; cont nutritional plan and wt loss - pathophysiology of disease discussed with pt. Elevated liver transaminases with an ALT predominance combined with obesity and insulin resistance/DM is characteristic. NAFLD is the 2nd leading cause of liver transplant in adults. Treatment includes weight loss, elimination of sweet drinks, including juice, avoidance of high fructose corn syrup, and exercise. As always, avoiding alcohol consumption is important. - Treatment goal: 7-10% reduction of body weight.   - Aerobic activity and resistance training are important to help achieve a healthy body weight BUT also increases peripheral insulin sensitivity, reducing delivery of free fatty acids and glucose to the liver.  Also recommended exercise to increase intrahepatic fatty acid oxidation, decrease fatty acid synthesis, and help prevent mitochondrial and hepatocellular damage. - Medications that can help reduce hepatic fat include metformin and GLP-1's, which were discussed with pt and pt declines additional medication treatment at this time.   Cont metformin!  2. Vitamin D deficiency Refill prescription ergocalciferol, but add 2000 IU daily OTC.  Patient was counseled on importance of vitamin D in regards to her general health and weight loss journey.  Refill- Vitamin D, Ergocalciferol, (DRISDOL) 1.25 MG (50000 UNIT) CAPS capsule; Take 1 capsule (50,000 Units total) by mouth every 7 (seven) days.  Dispense: 4 capsule; Refill: 0  Start- Cholecalciferol (VITAMIN D3) 50 MCG (2000 UT) capsule; Take 1 capsule (2,000 Units total) by mouth daily.  3. Diabetes mellitus type 2 in obese (HCC) Worse.  Continue metformin, has plenty per PCP.    Continue PNP and weight loss, since A1c is worse patient states she is ready  to really focus on her self-care and lose weight. Patient declines injectables/ GLP-1s.  4. Vitamin B12 deficiency The diagnosis was reviewed with the patient. Counseling provided today, see below. We will continue to monitor. Orders and follow up as documented in patient record.  Counseling The body needs vitamin B12: to make red blood cells; to make DNA; and to help the nerves work properly so they can carry messages from the brain to the body.  The main causes of vitamin B12 deficiency include dietary deficiency, digestive diseases, pernicious anemia, and having a surgery in which part of the stomach or small intestine is removed.  Certain medicines can make it harder for the body to absorb vitamin B12. These medicines include: heartburn medications; some antibiotics; some medications used to treat diabetes, gout, and high cholesterol.  In some cases, there are no symptoms of this condition. If the condition leads to anemia or nerve damage, various symptoms can occur, such as weakness or fatigue, shortness of breath, and numbness or tingling in your hands and feet.   Treatment:  May include taking vitamin B12 supplements.  Avoid alcohol.  Eat lots of healthy foods that contain vitamin B12: Beef, pork, chicken, [place], and organ meats, such as liver.  Seafood: This includes clams, rainbow trout, salmon, tuna, and haddock. Eggs.  Cereal and dairy products that are fortified: This means that vitamin B12 has been added to the food.  Continue B12 500 mcg OTC daily and PNP.  B12 deficiency counseling done.  5. Abnormal craving with emotional eating Patient declines need for counseling for emotional eating.  Continue medication management per PCP on wellbutrin.  Continue mindful eating practices.  Counseling done on ways to prevent emotional eating.  6. Obesity with current BMI of 30.8--start bmi 33.30 Patient's only goal is to track her calories and protein and intake each day.  This helps  patient with accountability.   Do 3d/wk cardio and 2d/wk strength training  Vickie Nichols is currently in the action stage of change. As such, her goal is to continue with weight loss efforts. She has agreed to the Category 2 Plan with breakfast and lunch options and 100-calorie snacks BUT JOURNAL intake!   Exercise goals: NumberFor substantial health benefits, adults should do at least 150 minutes (2 hours and 30 minutes) a week of moderate-intensity, or 75 minutes (1 hour and 15 minutes) a week of vigorous-intensity aerobic physical activity, or an equivalent combination of moderate- and vigorous-intensity aerobic activity. Aerobic activity should be performed in episodes of at least 10 minutes, and preferably, it should be spread throughout the week.  Behavioral modification strategies: increasing lean protein intake, decreasing simple carbohydrates, and keeping a strict food journal.  Vickie Nichols has agreed to follow-up with our clinic in 3 weeks. She was informed of the importance of frequent follow-up visits to maximize her success with intensive lifestyle modifications for her multiple health conditions.   Objective:   Blood pressure 136/71, pulse 61, temperature 98.5 F (36.9 C), height '5\' 9"'$  (1.753 m), weight 208 lb 6.4 oz (94.5 kg), SpO2 99 %. Body mass index is 30.78 kg/m.  General: Cooperative, alert, well developed, in no acute distress. HEENT: Conjunctivae and lids unremarkable.  Cardiovascular: Regular rhythm.  Lungs: Normal work of breathing. Neurologic: No focal deficits.   Lab Results  Component Value Date   CREATININE 0.82 01/18/2022   BUN 14 01/18/2022   NA 141 01/18/2022   K 4.7 01/18/2022   CL 103 01/18/2022   CO2 24 01/18/2022   Lab Results  Component Value Date   ALT 25 01/18/2022   AST 26 01/18/2022   ALKPHOS 82 01/18/2022   BILITOT 0.3 01/18/2022   Lab Results  Component Value Date   HGBA1C 6.4 (H) 01/18/2022   HGBA1C 6.2 (H) 07/27/2021   HGBA1C 6.1 (H)  01/31/2021   HGBA1C 7.1 (H) 10/01/2020   HGBA1C 6.5 06/21/2018   Lab Results  Component Value Date   INSULIN 16.6 01/18/2022   INSULIN 17.1 07/27/2021   INSULIN 24.5 02/21/2021   INSULIN 19.5 10/28/2020   Lab Results  Component Value Date   TSH 1.400 10/28/2020   Lab Results  Component Value Date   CHOL 127 07/27/2021   HDL 51 07/27/2021   LDLCALC 52 07/27/2021   LDLDIRECT 169.3 06/28/2012   TRIG 142 07/27/2021   CHOLHDL 4 06/21/2018   Lab Results  Component Value Date   VD25OH 38.2 01/18/2022   VD25OH 47.5 07/27/2021   VD25OH 47.4 02/21/2021   Lab Results  Component Value Date   WBC 8.4 07/27/2021   HGB 13.7 07/27/2021   HCT 40.9 07/27/2021   MCV 93 07/27/2021   PLT 261 07/27/2021   No results found for: "IRON", "TIBC", "FERRITIN"  Attestation Statements:   Reviewed by clinician on day of visit: allergies, medications, problem list, medical history, surgical history, family history, social history, and previous encounter notes.  I, Davy Pique, RMA, am acting as Location manager for [employer], DO.  I have reviewed the above documentation for completeness, and I agree with the above. Marjory Sneddon, D.O.  The [place] was signed into law in 2016 which includes the topic of electronic health records.  This provides immediate access to information in MyChart.  This includes consultation notes, operative notes, office notes, lab results and pathology reports.  If you have any questions about what you read please let us know at your next visit so we can discuss your concerns and take corrective action if need be.  We are right here with you.

## 2022-02-23 ENCOUNTER — Ambulatory Visit (INDEPENDENT_AMBULATORY_CARE_PROVIDER_SITE_OTHER): Payer: Medicare Other | Admitting: Family Medicine

## 2022-02-28 ENCOUNTER — Encounter: Payer: Self-pay | Admitting: Family Medicine

## 2022-02-28 ENCOUNTER — Ambulatory Visit (INDEPENDENT_AMBULATORY_CARE_PROVIDER_SITE_OTHER): Payer: Medicare Other | Admitting: Family Medicine

## 2022-02-28 VITALS — BP 148/88 | HR 78 | Temp 98.3°F | Resp 18 | Ht 69.0 in | Wt 214.4 lb

## 2022-02-28 DIAGNOSIS — E1169 Type 2 diabetes mellitus with other specified complication: Secondary | ICD-10-CM

## 2022-02-28 DIAGNOSIS — Z23 Encounter for immunization: Secondary | ICD-10-CM

## 2022-02-28 DIAGNOSIS — E669 Obesity, unspecified: Secondary | ICD-10-CM

## 2022-02-28 DIAGNOSIS — F320 Major depressive disorder, single episode, mild: Secondary | ICD-10-CM

## 2022-02-28 DIAGNOSIS — E785 Hyperlipidemia, unspecified: Secondary | ICD-10-CM

## 2022-02-28 DIAGNOSIS — Z6833 Body mass index (BMI) 33.0-33.9, adult: Secondary | ICD-10-CM

## 2022-02-28 DIAGNOSIS — E119 Type 2 diabetes mellitus without complications: Secondary | ICD-10-CM

## 2022-02-28 MED ORDER — OZEMPIC (0.25 OR 0.5 MG/DOSE) 2 MG/3ML ~~LOC~~ SOPN: PEN_INJECTOR | SUBCUTANEOUS | 3 refills | Status: DC

## 2022-02-28 MED ORDER — BUPROPION HCL ER (XL) 300 MG PO TB24: 300.0000 mg | ORAL_TABLET | Freq: Every day | ORAL | 3 refills | Status: DC

## 2022-02-28 NOTE — Assessment & Plan Note (Signed)
hgba1c acceptable, minimize simple carbs. Increase exercise as tolerated. Continue current meds  Lab Results  Component Value Date   HGBA1C 6.4 (H) 01/18/2022

## 2022-02-28 NOTE — Patient Instructions (Signed)

## 2022-02-28 NOTE — Assessment & Plan Note (Signed)
Con't with [facility]

## 2022-02-28 NOTE — Progress Notes (Signed)
Subjective:   By signing my name below, I, Shehryar Baig, attest that this documentation has been prepared under the direction and in the presence of Ann Held, DO. 02/28/2022   Patient ID: Vickie Nichols, female    DOB: 05-04-1955, 66 y.o.   MRN: [id number]  Chief Complaint  Patient presents with   Hyperlipidemia   Diabetes   Follow-up    Hyperlipidemia  Diabetes   Patient is in today for a follow up visit.   She is requesting a refill for 300 mg Wellbutrin daily PO.  She has been seeing a heathy weight and wellness program since October 2023 and reports losing weight while doing it. She reports her lowest weight while doing it was under 200 lb's. She is interested in trying weight loss injectable medication to help assist her weight loss.  Wt Readings from Last 3 Encounters:  02/28/22 214 lb 6.4 oz (97.3 kg)  02/02/22 208 lb 6.4 oz (94.5 kg)  01/18/22 210 lb 9.6 oz (95.5 kg)   She continues taking 1000 mg metformin 2x daily PO. Her blood sugar this morning was 98.  Lab Results  Component Value Date   HGBA1C 6.4 (H) 01/18/2022   Her last cholesterol levels were stable and showed improvement. She continues taking 2.5 mg rosuvastatin daily PO.  Lab Results  Component Value Date   CHOL 127 07/27/2021   HDL 51 07/27/2021   LDLCALC 52 07/27/2021   LDLDIRECT 169.3 06/28/2012   TRIG 142 07/27/2021   CHOLHDL 4 06/21/2018   She does not have the flu vaccine this year and is not interested in receiving it. She is eligible for the pneumonia vaccine and is interested in receiving it during this visit.    Past Medical History:  Diagnosis Date   Anxiety    Depression    Diabetes mellitus without complication ([place])    Fatty liver    Joint pain    Leg pain    when walking   Other fatigue    Postmenopausal    Shortness of breath on exertion    Type 2 diabetes mellitus (HCC)     Past Surgical History:  Procedure Laterality Date   CESAREAN SECTION  1993    COLONOSCOPY  10/22/2019   2015   TONSILLECTOMY  1962   TUBAL LIGATION  1993    Family History  Problem Relation Age of Onset   Obesity Mother    Sleep apnea Mother    Thyroid disease Mother    Kidney disease Mother    Heart disease Father    Lung disease Father        fungus-Aspergillosis   Breast cancer Maternal Aunt    Cancer Maternal Aunt        colon   Colon cancer Maternal Aunt    Esophageal cancer Maternal Aunt    Cancer Maternal Grandmother    Cancer Maternal Grandfather    Hypertension Paternal Grandfather    Diabetes Paternal Grandfather    Stroke Paternal Grandfather    Obesity Brother    Coronary artery disease Other 18       female   Colon cancer Other    Heart disease Other    Kidney disease Other    Sleep apnea Other    Obesity Other    Rectal cancer Neg Hx    Stomach cancer Neg Hx     Social History   Socioeconomic History   Marital status: Widowed    Spouse  name: Not on file   Number of children: 1   Years of education: Not on file   Highest education level: Not on file  Occupational History   Occupation: Retired  Tobacco Use   Smoking status: Former    Packs/day: 1.00    Years: 30.00    Total pack years: 30.00    Types: Cigarettes    Start date: 01/02/1978    Quit date: 01/03/2008    Years since quitting: 14.1   Smokeless tobacco: Never   Tobacco comments:    uses e cigarettes  Vaping Use   Vaping Use: Every day   Start date: 01/02/2014   Substances: Nicotine   Devices: Vape  Substance and Sexual Activity   Alcohol use: Never    Comment: rare   Drug use: No   Sexual activity: Not Currently  Other Topics Concern   Not on file  Social History Narrative   Not on file   Social Determinants of Health   Financial Resource Strain: Low Risk  (09/19/2021)   Overall Financial Resource Strain (CARDIA)    Difficulty of Paying Living Expenses: Not hard at all  Food Insecurity: No [place] (09/19/2021)   Hunger Vital Sign    Worried  About Running Out of Food in the Last Year: Never true    [place] in the Last Year: Never true  Transportation Needs: No Transportation Needs (09/19/2021)   PRAPARE - Hydrologist (Medical): No    Lack of Transportation (Non-Medical): No  Physical Activity: Insufficiently Active (09/19/2021)   Exercise Vital Sign    Days of Exercise per Week: 3 days    Minutes of Exercise per Session: 30 min  Stress: No Stress Concern Present (09/19/2021)   [place]    Feeling of Stress : Not at all  Social Connections: Socially Isolated (09/19/2021)   Social Connection and Isolation Panel [NHANES]    Frequency of Communication with Friends and Family: More than three times a week    Frequency of Social Gatherings with Friends and Family: More than three times a week    Attends Religious Services: Never    Marine scientist or Organizations: No    Attends Archivist Meetings: Never    Marital Status: Widowed  Intimate Partner Violence: Not At Risk (09/19/2021)   Humiliation, Afraid, Rape, and Kick questionnaire    Fear of Current or Ex-Partner: No    Emotionally Abused: No    Physically Abused: No    Sexually Abused: No    Outpatient Medications Prior to Visit  Medication Sig Dispense Refill   Cholecalciferol (VITAMIN D3) 50 MCG (2000 UT) capsule Take 1 capsule (2,000 Units total) by mouth daily.     cyanocobalamin (VITAMIN B12) 500 MCG tablet 300 mcg- 500 mcg qd     metFORMIN (GLUCOPHAGE) 1000 MG tablet TAKE 1 TABLET(1000 MG) BY MOUTH TWICE DAILY WITH A MEAL 60 tablet 0   rosuvastatin (CRESTOR) 5 MG tablet TAKE 1/2 TABLET(2.5 MG) BY MOUTH AT BEDTIME 15 tablet 0   Vitamin D, Ergocalciferol, (DRISDOL) 1.25 MG (50000 UNIT) CAPS capsule Take 1 capsule (50,000 Units total) by mouth every 7 (seven) days. 4 capsule 0   buPROPion (WELLBUTRIN XL) 300 MG 24 hr tablet TAKE 1 TABLET BY MOUTH  EVERY DAY 90 tablet 3   No facility-administered medications prior to visit.    No Known Allergies  ROS  Objective:    Physical Exam Constitutional:      General: She is not in acute distress.    Appearance: Normal appearance. She is not ill-appearing.  HENT:     Head: Normocephalic and atraumatic.     Right Ear: External ear normal.     Left Ear: External ear normal.  Eyes:     Extraocular Movements: Extraocular movements intact.     Pupils: Pupils are equal, round, and reactive to light.  Cardiovascular:     Rate and Rhythm: Normal rate and regular rhythm.     Heart sounds: Normal heart sounds. No murmur heard.    No gallop.  Pulmonary:     Effort: Pulmonary effort is normal. No respiratory distress.     Breath sounds: Normal breath sounds. No wheezing or rales.  Skin:    General: Skin is warm and dry.  Neurological:     Mental Status: She is alert and oriented to person, place, and time.  Psychiatric:        Judgment: Judgment normal.     BP (!) 148/88 (BP Location: Left Arm, Patient Position: Sitting, Cuff Size: Normal)   Pulse 78   Temp 98.3 F (36.8 C) (Oral)   Resp 18   Ht '5\' 9"'$  (1.753 m)   Wt 214 lb 6.4 oz (97.3 kg)   SpO2 98%   BMI 31.66 kg/m  Wt Readings from Last 3 Encounters:  02/28/22 214 lb 6.4 oz (97.3 kg)  02/02/22 208 lb 6.4 oz (94.5 kg)  01/18/22 210 lb 9.6 oz (95.5 kg)       Assessment & Plan:  Class 1 obesity with serious comorbidity and body mass index (BMI) of 33.0 to 33.9 in adult, unspecified obesity type Assessment & Plan: Con't with [facility]   Orders: -     Ozempic (0.25 or 0.5 MG/DOSE); 0.'25mg'$ / dose weekly x 1 month then inc to 0.5 mg  Dispense: 3 mL; Refill: 3  Depression, major, single episode, mild (HCC) -     buPROPion HCl ER (XL); Take 1 tablet (300 mg total) by mouth daily.  Dispense: 90 tablet; Refill: 3  Diabetes mellitus type 2 in obese (HCC) -     Microalbumin / creatinine urine ratio; Future  Need for  pneumococcal 20-valent conjugate vaccination -     Pneumococcal conjugate vaccine 20-valent  Diabetes mellitus type II, non insulin dependent ([place]) Assessment & Plan: hgba1c acceptable, minimize simple carbs. Increase exercise as tolerated. Continue current meds  Lab Results  Component Value Date   HGBA1C 6.4 (H) 01/18/2022      Hyperlipidemia LDL goal <70 Assessment & Plan: Encourage heart healthy diet such as MIND or DASH diet, increase exercise, avoid trans fats, simple carbohydrates and processed foods, consider a krill or fish or flaxseed oil cap daily.       IAnn Held, DO, personally preformed the services described in this documentation.  All medical record entries made by the scribe were at my direction and in my presence.  I have reviewed the chart and discharge instructions (if applicable) and agree that the record reflects my personal performance and is accurate and complete. 02/28/2022   I,Shehryar Baig,acting as a scribe for Ann Held, DO.,have documented all relevant documentation on the behalf of Ann Held, DO,as directed by  Ann Held, DO while in the presence of Ann Held, DO.   Ann Held, DO

## 2022-02-28 NOTE — Assessment & Plan Note (Signed)
Encourage heart healthy diet such as MIND or DASH diet, increase exercise, avoid trans fats, simple carbohydrates and processed foods, consider a krill or fish or flaxseed oil cap daily.  °

## 2022-03-09 ENCOUNTER — Other Ambulatory Visit (INDEPENDENT_AMBULATORY_CARE_PROVIDER_SITE_OTHER): Payer: Self-pay | Admitting: Family Medicine

## 2022-03-09 DIAGNOSIS — E559 Vitamin D deficiency, unspecified: Secondary | ICD-10-CM

## 2022-03-27 ENCOUNTER — Other Ambulatory Visit: Payer: Self-pay | Admitting: Family Medicine

## 2022-03-29 ENCOUNTER — Ambulatory Visit (INDEPENDENT_AMBULATORY_CARE_PROVIDER_SITE_OTHER): Payer: Medicare Other | Admitting: Family Medicine

## 2022-03-29 ENCOUNTER — Encounter (INDEPENDENT_AMBULATORY_CARE_PROVIDER_SITE_OTHER): Payer: Self-pay | Admitting: Family Medicine

## 2022-03-29 DIAGNOSIS — Z6830 Body mass index (BMI) 30.0-30.9, adult: Secondary | ICD-10-CM

## 2022-03-29 DIAGNOSIS — E669 Obesity, unspecified: Secondary | ICD-10-CM | POA: Diagnosis not present

## 2022-03-29 DIAGNOSIS — E538 Deficiency of other specified B group vitamins: Secondary | ICD-10-CM

## 2022-03-29 DIAGNOSIS — E559 Vitamin D deficiency, unspecified: Secondary | ICD-10-CM | POA: Diagnosis not present

## 2022-03-29 DIAGNOSIS — E1169 Type 2 diabetes mellitus with other specified complication: Secondary | ICD-10-CM

## 2022-03-29 DIAGNOSIS — Z7985 Long-term (current) use of injectable non-insulin antidiabetic drugs: Secondary | ICD-10-CM

## 2022-03-29 DIAGNOSIS — Z7984 Long term (current) use of oral hypoglycemic drugs: Secondary | ICD-10-CM

## 2022-03-29 MED ORDER — VITAMIN D (ERGOCALCIFEROL) 1.25 MG (50000 UNIT) PO CAPS: 50000.0000 [IU] | ORAL_CAPSULE | ORAL | 0 refills | Status: DC

## 2022-03-29 MED ORDER — CYANOCOBALAMIN 500 MCG PO TABS: ORAL_TABLET | ORAL | Status: DC

## 2022-03-29 MED ORDER — VITAMIN D3 50 MCG (2000 UT) PO CAPS: 2000.0000 [IU] | ORAL_CAPSULE | Freq: Every day | ORAL | Status: DC

## 2022-03-29 NOTE — Progress Notes (Signed)
Vickie Nichols, D.O.  ABFM, ABOM Specializing in Clinical Bariatric Medicine  Office located at: [address], [place]  [address]     Assessment and Plan:   Medications Discontinued During This Encounter  Medication Reason   cyanocobalamin (VITAMIN B12) 500 MCG tablet Reorder   Vitamin D, Ergocalciferol, (DRISDOL) 1.25 MG (50000 UNIT) CAPS capsule Reorder   Cholecalciferol (VITAMIN D3) 50 MCG (2000 UT) capsule Reorder     Meds ordered this encounter  Medications   Cholecalciferol (VITAMIN D3) 50 MCG (2000 UT) CAPS    Sig: Take 1 capsule (2,000 Units total) by mouth daily.    Dispense:  30 capsule   cyanocobalamin (VITAMIN B12) 500 MCG tablet    Sig: 300 mcg- 500 mcg qd   Vitamin D, Ergocalciferol, (DRISDOL) 1.25 MG (50000 UNIT) CAPS capsule    Sig: Take 1 capsule (50,000 Units total) by mouth every 7 (seven) days.    Dispense:  4 capsule    Refill:  0    30 d supply;  ** OV for RF **   Do not send RF request     Type 2 diabetes mellitus with morbid obesity ([place]) Assessment: Condition is Not optimized..  Lab Results  Component Value Date   HGBA1C 6.4 (H) 01/18/2022   HGBA1C 6.2 (H) 07/27/2021   HGBA1C 6.1 (H) 01/31/2021   INSULIN 16.6 01/18/2022   INSULIN 17.1 07/27/2021   INSULIN 24.5 02/21/2021  She reports good compliance and tolerance with Metformin 1000 mg BID. She endorses that she does not have any cravings/hunger.   Plan: Continue with Metformin 1000 mg BID.  We will begin managing her Ozempic per pt request since we see her so much more frequently. Vickie Nichols will continue to work on weight loss, exercise, via their meal plan we devised to help decrease the risk of progressing to diabetes.    Vitamin D deficiency Assessment: Condition is Not optimized. Lab Results  Component Value Date   VD25OH 38.2 01/18/2022   VD25OH 47.5 07/27/2021   VD25OH 47.4 02/21/2021  She has been more compliant with Ergocalciferol 50K IU weekly and Vitamin  D3 2,000 UT daily.   Plan:Will refill Vitamin D today.  Continue with meds. - weight loss will likely improve availability of vitamin D, thus encouraged Vickie Nichols to continue with meal plan and their weight loss efforts to further improve this condition.  Thus, we will need to monitor levels regularly (every 3-4 mo on average) to keep levels within normal limits and prevent over supplementation.   Vitamin B12 deficiency Assessment: Condition is Controlled. Lab Results  Component Value Date   [id number] 01/18/2022  Patient is compliant with Vitamin b12 500 mcg daily. No concerns.  Plan: Will refill Vitamin B12 today.  Continue with Vitamin B12 500 mcg daily.  Continue her prudent nutritional plan and continue to advance exercise and cardiovascular fitness as tolerated.    TREATMENT PLAN FOR OBESITY: OBESITY-WITH CURRENT BMI  30.4  (START BMI 33.30/DATE 10/28/20) Assessment: Condition is Improving, but not optimized.. Biometric data collected today, was reviewed with patient.  Fat mass has decreased by .4lb. Muscle mass has decreased by 1.8lb. Total body water has decreased by 1lb. She was recently started her Ozempic 0.25 mg weekly by her PCP on 02/28/22. She endorses that her appetite has decreased and that he Ozempic is helping with her cravings and hunger. She denies any GI side effects. She has expressed concern about skipping meals now that she is  on the Ozempic. Patient prefers [place] to manage her Ozempic as it is a weight loss medication and especially "since we see her more frequently and we are closely monitoring her diet"  Plan: Continue with  Ozempic 0.25 mg weekly. Continue with Category 2 meal plan with breakfast and lunch options with only 100 snack calories + journaling. We will begin managing her Ozempic per pt request.  Patient will advance to 0.5 mg weekly this upcoming Friday as she has already completed 4 weeks at the 0.25 mg dose. Patient understands the importance of  getting in the proper nutrition, even if she is not feeling particularly hungry.   Behavioral Intervention Additional resources provided today: patient declined Evidence-based interventions for health behavior change were utilized today including the discussion of self monitoring techniques, problem-solving barriers and SMART goal setting techniques.   Regarding patient's less desirable eating habits and patterns, we employed the technique of small changes.  Pt will specifically work on: walk at least 3 days a week for a minimum of 30 minutes for next visit.    Recommended Physical Activity Goals Vickie Nichols has been advised to work up to 150 minutes of moderate intensity aerobic activity a week and strengthening exercises 2-3 times per week for cardiovascular health, weight loss maintenance and preservation of muscle mass.  She has agreed to Continue current level of physical activity   FOLLOW UP: Return in about 3 weeks (around 04/19/2022). She was informed of the importance of frequent follow up visits to maximize her success with intensive lifestyle modifications for her multiple health conditions.  Subjective:   Chief complaint: Obesity Vickie Nichols is here to discuss her progress with her obesity treatment plan. She is on the the Category 2 Plan with breakfast and lunch options and 100 snack calories and keeping a food journal. she is following her eating plan approximately 40% of the time. She states she  is not exercising.   Interval History:  Vickie Nichols is here for a follow up office visit. We reviewed her meal plan and all questions were answered. Patient's food recall appears to be accurate and consistent with what is on plan when she is following it. When eating on plan, her hunger and cravings are well controlled.      Pharmacotherapy for weight loss: She is currently taking  Ozempic  and Metformin for medical weight loss.  Denies side effects.    Review of Systems:  Pertinent  positives were addressed with patient today.  Weight Summary and Biometrics   See biometric data collected today which was reviewed with pt  Objective:   PHYSICAL EXAM:  Blood pressure (!) 145/63, pulse 70, temperature 97.9 F (36.6 C), height 5\' 9"  (1.753 m), weight 206 lb (93.4 kg), SpO2 100 %. Body mass index is 30.42 kg/m.  General: Well Developed, well nourished, and in no acute distress.  HEENT: Normocephalic, atraumatic Skin: Warm and dry, cap RF less 2 sec, good turgor Chest:  Normal excursion, shape, no gross abn Respiratory: speaking in full sentences, no conversational dyspnea NeuroM-Sk: Ambulates w/o assistance, moves * 4 Psych: A and O *3, insight good, mood-full  DIAGNOSTIC DATA REVIEWED:  BMET    Component Value Date/Time   NA 141 01/18/2022 0827   K 4.7 01/18/2022 0827   CL 103 01/18/2022 0827   CO2 24 01/18/2022 0827   GLUCOSE 111 (H) 01/18/2022 0827   GLUCOSE 132 (H) 10/01/2020 0810   BUN 14 01/18/2022 0827   CREATININE 0.82 01/18/2022 0827  CALCIUM 9.7 01/18/2022 0827   GFRNONAA 54 (L) 12/17/2017 2253   GFRAA >60 12/17/2017 2253   Lab Results  Component Value Date   HGBA1C 6.4 (H) 01/18/2022   HGBA1C 6.5 07/20/2010   Lab Results  Component Value Date   INSULIN 16.6 01/18/2022   INSULIN 19.5 10/28/2020   Lab Results  Component Value Date   TSH 1.400 10/28/2020   CBC    Component Value Date/Time   WBC 8.4 07/27/2021 0829   WBC 8.4 09/16/2020 0959   RBC 4.38 07/27/2021 0829   RBC 4.45 09/16/2020 0959   HGB 13.7 07/27/2021 0829   HGB 14.2 09/22/2011 1500   HCT 40.9 07/27/2021 0829   HCT 42.4 09/22/2011 1500   PLT 261 07/27/2021 0829   MCV 93 07/27/2021 0829   MCV 93 09/22/2011 1500   MCH 31.3 07/27/2021 0829   MCH 31.1 12/17/2017 2253   MCHC 33.5 07/27/2021 0829   MCHC 32.9 09/16/2020 0959   RDW 12.1 07/27/2021 0829   RDW 12.9 09/22/2011 1500   Iron Studies No results found for: "IRON", "TIBC", "FERRITIN", "IRONPCTSAT" Lipid  Panel     Component Value Date/Time   CHOL 127 07/27/2021 0829   TRIG 142 07/27/2021 0829   HDL 51 07/27/2021 0829   CHOLHDL 4 06/21/2018 0926   VLDL 25.2 06/21/2018 0926   LDLCALC 52 07/27/2021 0829   LDLDIRECT 169.3 06/28/2012 0931   Hepatic Function Panel     Component Value Date/Time   PROT 6.9 01/18/2022 0827   ALBUMIN 4.6 01/18/2022 0827   AST 26 01/18/2022 0827   ALT 25 01/18/2022 0827   ALKPHOS 82 01/18/2022 0827   BILITOT 0.3 01/18/2022 0827   BILIDIR 0.1 04/02/2014 0852      Component Value Date/Time   TSH 1.400 10/28/2020 1003   Nutritional Lab Results  Component Value Date   VD25OH 38.2 01/18/2022   VD25OH 47.5 07/27/2021   VD25OH 47.4 02/21/2021    Attestations:   Reviewed by clinician on day of visit: allergies, medications, problem list, medical history, surgical history, family history, social history, and previous encounter notes.   Patient was in the office today and time spent on visit including pre-visit chart review and post-visit care/coordination of care and electronic medical record documentation was 40 minutes. 50% of the time was in face to face counseling of this patient's medical condition(s) and providing education on treatment options to include the first-line treatment of diet and lifestyle modification.   I,Special Puri,acting as a Education administrator for [employer], DO.,have documented all relevant documentation on the behalf of Mellody Dance, DO,as directed by  Mellody Dance, DO while in the presence of Mellody Dance, DO.   I, Mellody Dance, DO, have reviewed all documentation for this visit. The documentation on 04/05/22 for the exam, diagnosis, procedures, and orders are all accurate and complete.

## 2022-04-17 ENCOUNTER — Encounter (INDEPENDENT_AMBULATORY_CARE_PROVIDER_SITE_OTHER): Payer: Self-pay | Admitting: Family Medicine

## 2022-04-17 ENCOUNTER — Other Ambulatory Visit (INDEPENDENT_AMBULATORY_CARE_PROVIDER_SITE_OTHER): Payer: Self-pay | Admitting: Family Medicine

## 2022-04-17 ENCOUNTER — Ambulatory Visit (INDEPENDENT_AMBULATORY_CARE_PROVIDER_SITE_OTHER): Payer: Medicare Other | Admitting: Family Medicine

## 2022-04-17 DIAGNOSIS — E1169 Type 2 diabetes mellitus with other specified complication: Secondary | ICD-10-CM

## 2022-04-17 DIAGNOSIS — Z7984 Long term (current) use of oral hypoglycemic drugs: Secondary | ICD-10-CM

## 2022-04-17 DIAGNOSIS — E66811 Obesity, class 1: Secondary | ICD-10-CM

## 2022-04-17 DIAGNOSIS — E669 Obesity, unspecified: Secondary | ICD-10-CM

## 2022-04-17 DIAGNOSIS — E559 Vitamin D deficiency, unspecified: Secondary | ICD-10-CM | POA: Diagnosis not present

## 2022-04-17 DIAGNOSIS — Z6830 Body mass index (BMI) 30.0-30.9, adult: Secondary | ICD-10-CM

## 2022-04-17 MED ORDER — VITAMIN D (ERGOCALCIFEROL) 1.25 MG (50000 UNIT) PO CAPS: 50000.0000 [IU] | ORAL_CAPSULE | ORAL | 0 refills | Status: DC

## 2022-04-17 NOTE — Progress Notes (Signed)
[name], D.O.  ABFM, ABOM Specializing in Clinical Bariatric Medicine  Office located at: [address] [place], [place]  [address]     Assessment and Plan:   No orders of the defined types were placed in this encounter.   Medications Discontinued During This Encounter  Medication Reason   Vitamin D, Ergocalciferol, (DRISDOL) 1.25 MG (50000 UNIT) CAPS capsule Reorder     Meds ordered this encounter  Medications   Vitamin D, Ergocalciferol, (DRISDOL) 1.25 MG (50000 UNIT) CAPS capsule    Sig: Take 1 capsule (50,000 Units total) by mouth every 7 (seven) days.    Dispense:  4 capsule    Refill:  0    30 d supply;  ** OV for RF **   Do not send RF request    Type 2 diabetes mellitus with morbid obesity Assessment: Condition is improving, but Not optimized.  Lab Results  Component Value Date   HGBA1C 6.4 (H) 01/18/2022   HGBA1C 6.2 (H) 07/27/2021   HGBA1C 6.1 (H) 01/31/2021   INSULIN 16.6 01/18/2022   INSULIN 17.1 07/27/2021   INSULIN 24.5 02/21/2021  She reports good compliance and tolerance with Metformin 1,000 mg BID. Denies any side effects. She endorses that her hunger and cravings are well controlled.  Plan:Continue with med Continue her prudent nutritional plan that is low in simple carbohydrates, saturated fats and trans fats to goal of 5-10% weight loss to achieve significant health benefits.  Pt encouraged to continually advance exercise and cardiovascular fitness as tolerated throughout weight loss journey.     Vitamin D deficiency Assessment: Condition is Not optimized. Lab Results  Component Value Date   VD25OH 38.2 01/18/2022   VD25OH 47.5 07/27/2021   VD25OH 47.4 02/21/2021  No issues with Ergocalciferol 50K IU weekly, compliance good.   Plan: Continue with med. Will refill this today.   - weight loss will likely improve availability of vitamin D, thus encouraged Johnesha to continue with meal plan and their weight loss efforts to further  improve this condition.    TREATMENT PLAN FOR OBESITY: OBESITY-WITH CURRENT BMI  30.4  (START BMI 33.30/DATE 10/28/20) Assessment: Condition is improving Biometric data collected today, was reviewed with patient.  Fat mass has decreased by 4.6lb. Muscle mass has increased by .4lb. Total body water has not changed.  Patient is compliant with Ozempic 0.5 mg weekly. She states having diarrhea occasionally on the Ozempic. Patient endorses that she is not feeling hungry, which may likely be due to the Ozempic. She endorses she is not eating the entire portions on the meal plan.  Plan: Continue with med.  Chanyah is currently in the action stage of change. As such, her goal is to continue weight management plan. Kodee will work on healthier eating habits and try their best to Continue with  the Category 2 meal plan with breakfast and lunch options and only 100 snack calories. I stressed the importance of eating all the food on the meal plan  Behavioral Intervention Additional resources provided today: patient declined Evidence-based interventions for health behavior change were utilized today including the discussion of self monitoring techniques, problem-solving barriers and SMART goal setting techniques.   Regarding patient's less desirable eating habits and patterns, we employed the technique of small changes.  Pt will specifically work on: increase walking regiment to 45 minutes, 4 days a week for next visit.    Recommended Physical Activity Goals Cathlene has been advised to work up to 150 minutes  of moderate intensity aerobic activity a week and strengthening exercises 2-3 times per week for cardiovascular health, weight loss maintenance and preservation of muscle mass.  She has agreed to Think about ways to increase physical activity  FOLLOW UP: Return in about 2 weeks (around 05/01/2022). She was informed of the importance of frequent follow up visits to maximize her success with  intensive lifestyle modifications for her multiple health conditions.   Subjective:   Chief complaint: Obesity Mahima is here to discuss her progress with her obesity treatment plan. She is on the the Category 2 Plan with breakfast and lunch options and only 100 snack calories and states she is following her eating plan approximately 50% of the time. She states she is walking 45 minutes 3 days per week.  Interval History:  NAZYIA DEJARDIN is here for a follow up office visit. Since last office visit she was not able to go to the gym 3 days a week because she was out of town for a funeral. She endorses that she is eating the goods from the meal plan, but is not eating the full amounts. She is not measuring her protein intakes.She denies having any hunger/cravings.We reviewed her meal plan and all questions were answered.   Pharmacotherapy for weight loss: She is currently taking  Metformin and Semaglutide  for medical weight loss.  Denies side effects.    Review of Systems:  Pertinent positives were addressed with patient today.   Weight Summary and Biometrics   Weight Lost Since Last Visit: 5lb  Weight Gained Since Last Visit: 0    Vitals Temp: 97.9 F (36.6 C) BP: 129/67 Pulse Rate: 78 SpO2: 98 %   Anthropometric Measurements Height: 5\' 9"  (1.753 m) Weight: 201 lb (91.2 kg) BMI (Calculated): 29.67 Weight at Last Visit: 206lb Weight Lost Since Last Visit: 5lb Weight Gained Since Last Visit: 0 Starting Weight: 219lb Total Weight Loss (lbs): 18 lb (8.165 kg)   Body Composition  Body Fat %: 40.8 % Fat Mass (lbs): 82.4 lbs Muscle Mass (lbs): 113.4 lbs Total Body Water (lbs): 78.8 lbs Visceral Fat Rating : 11   Other Clinical Data Fasting: No Labs: No Today's Visit #: 25 Starting Date: 10/28/20     Objective:   PHYSICAL EXAM: Blood pressure 129/67, pulse 78, temperature 97.9 F (36.6 C), height 5\' 9"  (1.753 m), weight 201 lb (91.2 kg), SpO2 98 %. Body  mass index is 29.68 kg/m.  General: Well Developed, well nourished, and in no acute distress.  HEENT: Normocephalic, atraumatic Skin: Warm and dry, cap RF less 2 sec, good turgor Chest:  Normal excursion, shape, no gross abn Respiratory: speaking in full sentences, no conversational dyspnea NeuroM-Sk: Ambulates w/o assistance, moves * 4 Psych: A and O *3, insight good, mood-full  DIAGNOSTIC DATA REVIEWED:  BMET    Component Value Date/Time   NA 141 01/18/2022 0827   K 4.7 01/18/2022 0827   CL 103 01/18/2022 0827   CO2 24 01/18/2022 0827   GLUCOSE 111 (H) 01/18/2022 0827   GLUCOSE 132 (H) 10/01/2020 0810   BUN 14 01/18/2022 0827   CREATININE 0.82 01/18/2022 0827   CALCIUM 9.7 01/18/2022 0827   GFRNONAA 54 (L) 12/17/2017 2253   GFRAA >60 12/17/2017 2253   Lab Results  Component Value Date   HGBA1C 6.4 (H) 01/18/2022   HGBA1C 6.5 07/20/2010   Lab Results  Component Value Date   INSULIN 16.6 01/18/2022   INSULIN 19.5 10/28/2020   Lab Results  Component Value Date   TSH 1.400 10/28/2020   CBC    Component Value Date/Time   WBC 8.4 07/27/2021 0829   WBC 8.4 09/16/2020 0959   RBC 4.38 07/27/2021 0829   RBC 4.45 09/16/2020 0959   HGB 13.7 07/27/2021 0829   HGB 14.2 09/22/2011 1500   HCT 40.9 07/27/2021 0829   HCT 42.4 09/22/2011 1500   PLT 261 07/27/2021 0829   MCV 93 07/27/2021 0829   MCV 93 09/22/2011 1500   MCH 31.3 07/27/2021 0829   MCH 31.1 12/17/2017 2253   MCHC 33.5 07/27/2021 0829   MCHC 32.9 09/16/2020 0959   RDW 12.1 07/27/2021 0829   RDW 12.9 09/22/2011 1500   Iron Studies No results found for: "IRON", "TIBC", "FERRITIN", "IRONPCTSAT" Lipid Panel     Component Value Date/Time   CHOL 127 07/27/2021 0829   TRIG 142 07/27/2021 0829   HDL 51 07/27/2021 0829   CHOLHDL 4 06/21/2018 0926   VLDL 25.2 06/21/2018 0926   LDLCALC 52 07/27/2021 0829   LDLDIRECT 169.3 06/28/2012 0931   Hepatic Function Panel     Component Value Date/Time   PROT 6.9  01/18/2022 0827   ALBUMIN 4.6 01/18/2022 0827   AST 26 01/18/2022 0827   ALT 25 01/18/2022 0827   ALKPHOS 82 01/18/2022 0827   BILITOT 0.3 01/18/2022 0827   BILIDIR 0.1 04/02/2014 0852      Component Value Date/Time   TSH 1.400 10/28/2020 1003   Nutritional Lab Results  Component Value Date   VD25OH 38.2 01/18/2022   VD25OH 47.5 07/27/2021   VD25OH 47.4 02/21/2021    Attestations:   Reviewed by clinician on day of visit: allergies, medications, problem list, medical history, surgical history, family history, social history, and previous encounter notes.   I,Special Puri,acting as a Neurosurgeon for [employer], DO.,have documented all relevant documentation on the behalf of Thomasene Lot, DO,as directed by  Thomasene Lot, DO while in the presence of Thomasene Lot, DO.   I, Thomasene Lot, DO, have reviewed all documentation for this visit. The documentation on 04/17/22 for the exam, diagnosis, procedures, and orders are all accurate and complete.

## 2022-04-18 ENCOUNTER — Other Ambulatory Visit: Payer: Self-pay | Admitting: Family Medicine

## 2022-04-18 DIAGNOSIS — E1169 Type 2 diabetes mellitus with other specified complication: Secondary | ICD-10-CM

## 2022-04-21 ENCOUNTER — Other Ambulatory Visit: Payer: Self-pay

## 2022-04-21 ENCOUNTER — Encounter: Payer: Self-pay | Admitting: Family Medicine

## 2022-04-21 DIAGNOSIS — E1169 Type 2 diabetes mellitus with other specified complication: Secondary | ICD-10-CM

## 2022-04-21 MED ORDER — ROSUVASTATIN CALCIUM 5 MG PO TABS: 2.5000 mg | ORAL_TABLET | Freq: Every day | ORAL | 3 refills | Status: DC

## 2022-05-02 ENCOUNTER — Ambulatory Visit (INDEPENDENT_AMBULATORY_CARE_PROVIDER_SITE_OTHER): Payer: Medicare Other | Admitting: Family Medicine

## 2022-05-02 ENCOUNTER — Encounter (INDEPENDENT_AMBULATORY_CARE_PROVIDER_SITE_OTHER): Payer: Self-pay | Admitting: Family Medicine

## 2022-05-02 DIAGNOSIS — E559 Vitamin D deficiency, unspecified: Secondary | ICD-10-CM | POA: Diagnosis not present

## 2022-05-02 DIAGNOSIS — Z7984 Long term (current) use of oral hypoglycemic drugs: Secondary | ICD-10-CM

## 2022-05-02 DIAGNOSIS — E669 Obesity, unspecified: Secondary | ICD-10-CM | POA: Diagnosis not present

## 2022-05-02 DIAGNOSIS — E1169 Type 2 diabetes mellitus with other specified complication: Secondary | ICD-10-CM | POA: Diagnosis not present

## 2022-05-02 DIAGNOSIS — Z6829 Body mass index (BMI) 29.0-29.9, adult: Secondary | ICD-10-CM

## 2022-05-02 MED ORDER — VITAMIN D (ERGOCALCIFEROL) 1.25 MG (50000 UNIT) PO CAPS: 50000.0000 [IU] | ORAL_CAPSULE | ORAL | 0 refills | Status: DC

## 2022-05-02 NOTE — Progress Notes (Signed)
Vickie Nichols, D.O.  ABFM, ABOM Specializing in Clinical Bariatric Medicine  Office located at: [address] [place], [place]  [address]     Assessment and Plan:   Orders Placed This Encounter  Procedures   VITAMIN D 25 Hydroxy (Vit-D Deficiency, Fractures)   Hemoglobin A1c    Medications Discontinued During This Encounter  Medication Reason   Vitamin D, Ergocalciferol, (DRISDOL) 1.25 MG (50000 UNIT) CAPS capsule Reorder     Meds ordered this encounter  Medications   Vitamin D, Ergocalciferol, (DRISDOL) 1.25 MG (50000 UNIT) CAPS capsule    Sig: Take 1 capsule (50,000 Units total) by mouth every 7 (seven) days.    Dispense:  4 capsule    Refill:  0    30 d supply;  ** OV for RF **   Do not send RF request    Patient to come in for A1c and Vitamin D labs prior next OV.   Type 2 diabetes mellitus with morbid obesity (HCC) Assessment: Condition is stable. Lab Results  Component Value Date   HGBA1C 6.4 (H) 01/18/2022   HGBA1C 6.2 (H) 07/27/2021   HGBA1C 6.1 (H) 01/31/2021   INSULIN 16.6 01/18/2022   INSULIN 17.1 07/27/2021   INSULIN 24.5 02/21/2021  - No issues with Metformin 1000 mg BID as recommended by PCP. Denies any side effects from the medication. -  Hunger and cravings are controlled when eating on plan.  - She occasionally checks her blood sugars at home. She is not having any highs/lows at home.   Plan: - Continue with med as recommended by PCP.  - Continue to decrease simple carbs/ sugars; increase fiber and proteins -> follow her meal plan.    Vickie Nichols will continue to work on weight loss, exercise, via their meal plan we devised to help decrease the risk of progressing to diabetes.    Vitamin D deficiency Assessment: Condition is not optimized.  Lab Results  Component Value Date   VD25OH 38.2 01/18/2022   VD25OH 47.5 07/27/2021   VD25OH 47.4 02/21/2021  - She reports good compliance and tolerance with Ergocalciferol 50K IU weekly. She  denies any side effects.   Plan: - Continue with med. Will refill this today.    - weight loss will likely improve availability of vitamin D, thus encouraged Vickie Nichols to continue with meal plan and their weight loss efforts to further improve this condition.    TREATMENT PLAN FOR OBESITY: OBESITY-WITH CURRENT BMI  29.82  (START BMI 33.30/DATE 10/28/20) Assessment: Condition is improving, but not optimized. Biometric data collected today, was reviewed with patient.  Fat mass has decreased by 2.4 lb. Muscle mass has increased by 3.2 lb. Total body water has decreased by .2 lb  - She has been compliant with Ozempic 0.25 mg weekly, denies any side effects.  Plan:  - Continue with med as prescribed by PCP.  Vickie Nichols is currently in the action stage of change. As such, her goal is to continue weight management plan. Vickie Nichols will work on [employer] habits and continue with the Category 2 meal plan with breakfast and lunch options and only 200 snack calories.   Behavioral Intervention Additional resources provided today: patient declined Evidence-based interventions for health behavior change were utilized today including the discussion of self monitoring techniques, problem-solving barriers and SMART goal setting techniques.   Regarding patient's less desirable eating habits and patterns, we employed the technique of small changes.  Pt will specifically work on:  going to gym 45 minutes, 3 times a week  for next visit.    Recommended Physical Activity Goals Vickie Nichols has been advised to work up to 150 minutes of moderate intensity aerobic activity a week and strengthening exercises 2-3 times per week for cardiovascular health, weight loss maintenance and preservation of muscle mass.  She has agreed to Think about ways to increase physical activity  FOLLOW UP: Return in about 3 weeks (around 05/23/2022). She was informed of the importance of frequent follow up visits to maximize her  success with intensive lifestyle modifications for her multiple health conditions.  Subjective:   Chief complaint: Obesity Vickie Nichols is here to discuss her progress with her obesity treatment plan. She is on the the Category 2 Plan with breakfast and lunch options with only 200 snack calories.and states she is following her eating plan approximately 40% of the time. She states she not exercising.   Interval History:  Vickie Nichols is here for a follow up office visit.  Since last office visit she: - Did not meet her goal of "increasing walking regiment to 45 minutes, 4 days a week.  - Endorses not eating much and exercising for 4-5 days since last ov due to her seasonal allergy symptoms.    Pharmacotherapy for weight loss: She is currently taking  Metformin and Ozempic  for medical weight loss.  Denies side effects.    Review of Systems:  Pertinent positives were addressed with patient today.   Weight Summary and Biometrics   Weight Lost Since Last Visit: 0  Weight Gained Since Last Visit: 1 lb    Vitals Temp: 98.3 F (36.8 C) BP: (!) 149/84 Pulse Rate: 71 SpO2: 97 %   Anthropometric Measurements Height: 5\' 9"  (1.753 m) Weight: 202 lb (91.6 kg) BMI (Calculated): 29.82 Weight at Last Visit: 201 lb Weight Lost Since Last Visit: 0 Weight Gained Since Last Visit: 1 lb Starting Weight: 219 lb Total Weight Loss (lbs): 17 lb (7.711 kg) Peak Weight: 230 lb   Body Composition  Body Fat %: 42.3 % Fat Mass (lbs): 85.6 lbs Muscle Mass (lbs): 111 lbs Total Body Water (lbs): 78.6 lbs Visceral Fat Rating : 11   Other Clinical Data Fasting: No Labs: No Today's Visit #: 26 Starting Date: 10/28/20    Objective:   PHYSICAL EXAM: Blood pressure (!) 149/84, pulse 71, temperature 98.3 F (36.8 C), height 5\' 9"  (1.753 m), weight 202 lb (91.6 kg), SpO2 97 %. Body mass index is 29.83 kg/m.  General: Well Developed, well nourished, and in no acute distress.  HEENT:  Normocephalic, atraumatic Skin: Warm and dry, cap RF less 2 sec, good turgor Chest:  Normal excursion, shape, no gross abn Respiratory: speaking in full sentences, no conversational dyspnea NeuroM-Sk: Ambulates w/o assistance, moves * 4 Psych: A and O *3, insight good, mood-full  DIAGNOSTIC DATA REVIEWED:  BMET    Component Value Date/Time   NA 141 01/18/2022 0827   K 4.7 01/18/2022 0827   CL 103 01/18/2022 0827   CO2 24 01/18/2022 0827   GLUCOSE 111 (H) 01/18/2022 0827   GLUCOSE 132 (H) 10/01/2020 0810   BUN 14 01/18/2022 0827   CREATININE 0.82 01/18/2022 0827   CALCIUM 9.7 01/18/2022 0827   GFRNONAA 54 (L) 12/17/2017 2253   GFRAA >60 12/17/2017 2253   Lab Results  Component Value Date   HGBA1C 6.4 (H) 01/18/2022   HGBA1C 6.5 07/20/2010   Lab Results  Component Value Date   INSULIN 16.6  01/18/2022   INSULIN 19.5 10/28/2020   Lab Results  Component Value Date   TSH 1.400 10/28/2020   CBC    Component Value Date/Time   WBC 8.4 07/27/2021 0829   WBC 8.4 09/16/2020 0959   RBC 4.38 07/27/2021 0829   RBC 4.45 09/16/2020 0959   HGB 13.7 07/27/2021 0829   HGB 14.2 09/22/2011 1500   HCT 40.9 07/27/2021 0829   HCT 42.4 09/22/2011 1500   PLT 261 07/27/2021 0829   MCV 93 07/27/2021 0829   MCV 93 09/22/2011 1500   MCH 31.3 07/27/2021 0829   MCH 31.1 12/17/2017 2253   MCHC 33.5 07/27/2021 0829   MCHC 32.9 09/16/2020 0959   RDW 12.1 07/27/2021 0829   RDW 12.9 09/22/2011 1500   Iron Studies No results found for: "IRON", "TIBC", "FERRITIN", "IRONPCTSAT" Lipid Panel     Component Value Date/Time   CHOL 127 07/27/2021 0829   TRIG 142 07/27/2021 0829   HDL 51 07/27/2021 0829   CHOLHDL 4 06/21/2018 0926   VLDL 25.2 06/21/2018 0926   LDLCALC 52 07/27/2021 0829   LDLDIRECT 169.3 06/28/2012 0931   Hepatic Function Panel     Component Value Date/Time   PROT 6.9 01/18/2022 0827   ALBUMIN 4.6 01/18/2022 0827   AST 26 01/18/2022 0827   ALT 25 01/18/2022 0827    ALKPHOS 82 01/18/2022 0827   BILITOT 0.3 01/18/2022 0827   BILIDIR 0.1 04/02/2014 0852      Component Value Date/Time   TSH 1.400 10/28/2020 1003   Nutritional Lab Results  Component Value Date   VD25OH 38.2 01/18/2022   VD25OH 47.5 07/27/2021   VD25OH 47.4 02/21/2021    Attestations:   Reviewed by clinician on day of visit: allergies, medications, problem list, medical history, surgical history, family history, social history, and previous encounter notes.    I,Special Puri,acting as a Neurosurgeon for [employer], DO.,have documented all relevant documentation on the behalf of Vickie Lot, DO,as directed by  Vickie Lot, DO while in the presence of Vickie Lot, DO.   I, Vickie Lot, DO, have reviewed all documentation for this visit. The documentation on 05/02/22 for the exam, diagnosis, procedures, and orders are all accurate and complete.

## 2022-05-16 ENCOUNTER — Encounter: Payer: Self-pay | Admitting: Family Medicine

## 2022-05-17 MED ORDER — SEMAGLUTIDE (1 MG/DOSE) 4 MG/3ML ~~LOC~~ SOPN: 1.0000 mg | PEN_INJECTOR | SUBCUTANEOUS | 0 refills | Status: DC

## 2022-05-18 ENCOUNTER — Ambulatory Visit (INDEPENDENT_AMBULATORY_CARE_PROVIDER_SITE_OTHER): Payer: Medicare Other | Admitting: Family Medicine

## 2022-05-26 ENCOUNTER — Other Ambulatory Visit: Payer: Self-pay | Admitting: Family Medicine

## 2022-05-26 DIAGNOSIS — F320 Major depressive disorder, single episode, mild: Secondary | ICD-10-CM

## 2022-05-31 LAB — VITAMIN D 25 HYDROXY (VIT D DEFICIENCY, FRACTURES): Vit D, 25-Hydroxy: 67.4 ng/mL (ref 30.0–100.0)

## 2022-05-31 LAB — HEMOGLOBIN A1C
Est. average glucose Bld gHb Est-mCnc: 123 mg/dL
Hgb A1c MFr Bld: 5.9 % — ABNORMAL HIGH (ref 4.8–5.6)

## 2022-06-01 ENCOUNTER — Encounter (INDEPENDENT_AMBULATORY_CARE_PROVIDER_SITE_OTHER): Payer: Self-pay | Admitting: Family Medicine

## 2022-06-01 ENCOUNTER — Ambulatory Visit (INDEPENDENT_AMBULATORY_CARE_PROVIDER_SITE_OTHER): Payer: Medicare Other | Admitting: Family Medicine

## 2022-06-01 VITALS — BP 157/83 | HR 64 | Temp 98.1°F | Ht 69.0 in | Wt 198.0 lb

## 2022-06-01 DIAGNOSIS — Z636 Dependent relative needing care at home: Secondary | ICD-10-CM

## 2022-06-01 DIAGNOSIS — Z6829 Body mass index (BMI) 29.0-29.9, adult: Secondary | ICD-10-CM

## 2022-06-01 DIAGNOSIS — E1169 Type 2 diabetes mellitus with other specified complication: Secondary | ICD-10-CM | POA: Diagnosis not present

## 2022-06-01 DIAGNOSIS — Z7984 Long term (current) use of oral hypoglycemic drugs: Secondary | ICD-10-CM

## 2022-06-01 DIAGNOSIS — E559 Vitamin D deficiency, unspecified: Secondary | ICD-10-CM | POA: Diagnosis not present

## 2022-06-01 DIAGNOSIS — E669 Obesity, unspecified: Secondary | ICD-10-CM

## 2022-06-01 DIAGNOSIS — R030 Elevated blood-pressure reading, without diagnosis of hypertension: Secondary | ICD-10-CM

## 2022-06-01 MED ORDER — VITAMIN D (ERGOCALCIFEROL) 1.25 MG (50000 UNIT) PO CAPS: 50000.0000 [IU] | ORAL_CAPSULE | ORAL | 0 refills | Status: DC

## 2022-06-01 NOTE — Progress Notes (Signed)
[name], D.O.  ABFM, ABOM Specializing in Clinical Bariatric Medicine  Office located at: [address] [place], [place]  [address]     Assessment and Plan:   Medications Discontinued During This Encounter  Medication Reason   Vitamin D, Ergocalciferol, (DRISDOL) 1.25 MG (50000 UNIT) CAPS capsule Reorder     Meds ordered this encounter  Medications   Vitamin D, Ergocalciferol, (DRISDOL) 1.25 MG (50000 UNIT) CAPS capsule    Sig: Take 1 capsule (50,000 Units total) by mouth every 7 (seven) days.    Dispense:  4 capsule    Refill:  0    30 d supply;  ** OV for RF **   Do not send RF request     Type 2 diabetes mellitus with morbid obesity (HCC) Assessment: Condition is improving, but not optimized. Labs were reviewed with pt today and education provided on them and how the foods patient eats may influence these findings. All questions were answered about them.   Lab Results  Component Value Date   HGBA1C 5.9 (H) 05/30/2022   HGBA1C 6.4 (H) 01/18/2022   HGBA1C 6.2 (H) 07/27/2021   INSULIN 16.6 01/18/2022   INSULIN 17.1 07/27/2021   INSULIN 24.5 02/21/2021  - Labs indicate that her A1c levels have improved from 6.4 on 01/18/2022 to 5.9 on 05/30/2022.  - She has been compliant and tolerant with Metformin 1,000 mg BID. Denies any adverse effects.   Plan: - Continue with Metformin as recommended by her PCP.  - Continue her prudent nutritional plan that is low in simple carbohydrates, saturated fats and trans fats to goal of 5-10% weight loss to achieve significant health benefits.  Pt encouraged to continually advance exercise and cardiovascular fitness as tolerated throughout weight loss journey.  - Will continue to monitor condition closely alongside PCP.    White coat syndrome without hypertension Assessment: Condition is not optimized, patient is asymptomatic.  Last 3 blood pressure readings in our office are as follows: BP Readings from Last 3 Encounters:   06/01/22 (!) 157/83  05/02/22 (!) 149/84  04/17/22 129/67  - Pt is currently not on any bp medication. Her increased blood pressure is likely due to a combination of white coat syndrome and eating more off plan foods recently.   Plan: - Counseling was again done on: Lifestyle changes such as following our low salt, heart healthy meal plan and engaging in a regular exercise program. - Avoid buying foods that are: processed, frozen, or prepackaged to avoid excess salt. - We will continue to monitor closely alongside PCP/ specialists.  Pt reminded to also f/up with those individuals as instructed by them.  - We will continue to monitor symptoms as they relate to the her weight loss journey.     Vitamin D deficiency Assessment: Condition is at goal. Labs were reviewed with patient today and education provided on them.   All of the patient's questions about them were answered   Lab Results  Component Value Date   VD25OH 67.4 05/30/2022   VD25OH 38.2 01/18/2022   VD25OH 47.5 07/27/2021  - Most recent labs indicate that her Vitamin D levels are within normal limits.  - No issues with Ergocalciferol 50K IU weekly. Denies any adverse effects.  Plan: - Continue with OTC supplement. Will refill this today.   - We will continue to monitor levels regularly (every 3-4 mo on average) to keep levels within normal limits and prevent over supplementation. - pt's questions and concerns  regarding this condition addressed.      Caregiver stress- emotional eating Assessment: Condition is not optimized.  - Denies any SI/HI. Mood is stable. - Reports that her care-giver stress is still on going. - Endorses having emotional cravings for sweets and has been sometimes skipping lunch.  - No issues with Wellbutrin XL 300 mg daily as recommended by PCP. Denies any adverse effects.   Plan:  - I again informed patient about Dr. Dewaine Conger, our Bariatric Psychologist, who can help [facility] with  emotional eating.  Patient may consider meeting with her in the future, but declines today.  - Importance of not skipping meals and getting all her proteins and fiber in on a daily basis discussed.   - Encouraged Ashira to be mindful as to why she has sweet cravings.  - We will continue to monitor closely alongside PCP / other specialists.     TREATMENT PLAN FOR OBESITY: OBESITY-WITH CURRENT BMI  29.23 (START BMI 33.30/DATE 10/28/20) Assessment:  SOILA IPPOLITI is here to discuss her progress with her obesity treatment plan along with follow-up of her obesity related diagnoses. See Medical Weight Management Flowsheet for complete bioelectrical impedance results.  Condition is improving.  Biometric data collected today, was reviewed with patient.   Since last office visit on 05/02/22 patient's  Muscle mass has increased by 0.8 lb. Fat mass has decreased by 5.4 lb. Total body water has decreased by 1.8 lb.  Counseling done on how various foods will affect these numbers and how to maximize success  Total lbs lost to date: 21  Total weight loss percentage to date: 9.59   - Patient is currently finishing her 0.5 mg Semaglutide. She states that she has a two week supply left and will then start the 1 mg Semalgutide  given by her PCP.  Plan:  - Continue with Ozempic as recommended by PCP.  - Continue with the Category 2 meal plan with breakfast and lunch options and only 200 snack calories and if she desires journaling 1200-1300 calories and 80+ grams of protein daily.   Behavioral Intervention Additional resources provided today:  food journal log Evidence-based interventions for health behavior change were utilized today including the discussion of self monitoring techniques, problem-solving barriers and SMART goal setting techniques.   Regarding patient's less desirable eating habits and patterns, we employed the technique of small changes.  Pt will specifically work on: being mindful as to why she has  sweet cravings and journaling if she desires for next visit.    Recommended Physical Activity Goals  Maycel has been advised to slowly work up to 150 minutes of moderate intensity aerobic activity a week and strengthening exercises 2-3 times per week for cardiovascular health, weight loss maintenance and preservation of muscle mass.   She has agreed to Continue current level of physical activity   FOLLOW UP: Return in about 3 weeks (around 06/22/2022). She was informed of the importance of frequent follow up visits to maximize her success with intensive lifestyle modifications for her multiple health conditions.   Subjective:   Chief complaint: Obesity Heven is here to discuss her progress with her obesity treatment plan. She is on the the Category 2 Plan with breakfast and lunch options and only 200 snack calories and states she is following her eating plan approximately 60% of the time. She states she is doing yard work 45 minutes 1  days per week.  Interval History:  TEMISHA SABIN is here for a follow up  office visit.     Since last office visit:   - Reports that her care-giver stress is still on going. - Endorses having emotional cravings for sweets and has been sometimes skipping lunch.  - She has not been journaling her intake, but has been working on increased mindfulness when choosing foods.  - Has an upcoming vacation to [facility].  Pharmacotherapy for weight loss: She is currently taking Wellbutrin and Ozempic for medical weight loss.  Denies side effects.    Review of Systems:  Pertinent positives were addressed with patient today.  Weight Summary and Biometrics   Weight Lost Since Last Visit: 4 lb  Weight Gained Since Last Visit: 0    Vitals Temp: 98.1 F (36.7 C) BP: (!) 157/83 Pulse Rate: 64 SpO2: 99 %   Anthropometric Measurements Height: 5\' 9"  (1.753 m) Weight: 198 lb (89.8 kg) BMI (Calculated): 29.23 Weight at Last Visit: 202  lb Weight Lost Since Last Visit: 4 lb Weight Gained Since Last Visit: 0 Starting Weight: 219 lb Total Weight Loss (lbs): 21 lb (9.526 kg) Peak Weight: 230 lb   Body Composition  Body Fat %: 40.5 % Fat Mass (lbs): 80.2 lbs Muscle Mass (lbs): 111.8 lbs Total Body Water (lbs): 76.8 lbs Visceral Fat Rating : 11   Other Clinical Data Fasting: No Labs: No Today's Visit #: 27 Starting Date: 10/28/20   Objective:   PHYSICAL EXAM: Blood pressure (!) 157/83, pulse 64, temperature 98.1 F (36.7 C), height 5\' 9"  (1.753 m), weight 198 lb (89.8 kg), SpO2 99 %. Body mass index is 29.24 kg/m.  General: Well Developed, well nourished, and in no acute distress.  HEENT: Normocephalic, atraumatic Skin: Warm and dry, cap RF less 2 sec, good turgor Chest:  Normal excursion, shape, no gross abn Respiratory: speaking in full sentences, no conversational dyspnea NeuroM-Sk: Ambulates w/o assistance, moves * 4 Psych: A and O *3, insight good, mood-full  DIAGNOSTIC DATA REVIEWED:  BMET    Component Value Date/Time   NA 141 01/18/2022 0827   K 4.7 01/18/2022 0827   CL 103 01/18/2022 0827   CO2 24 01/18/2022 0827   GLUCOSE 111 (H) 01/18/2022 0827   GLUCOSE 132 (H) 10/01/2020 0810   BUN 14 01/18/2022 0827   CREATININE 0.82 01/18/2022 0827   CALCIUM 9.7 01/18/2022 0827   GFRNONAA 54 (L) 12/17/2017 2253   GFRAA >60 12/17/2017 2253   Lab Results  Component Value Date   HGBA1C 5.9 (H) 05/30/2022   HGBA1C 6.5 07/20/2010   Lab Results  Component Value Date   INSULIN 16.6 01/18/2022   INSULIN 19.5 10/28/2020   Lab Results  Component Value Date   TSH 1.400 10/28/2020   CBC    Component Value Date/Time   WBC 8.4 07/27/2021 0829   WBC 8.4 09/16/2020 0959   RBC 4.38 07/27/2021 0829   RBC 4.45 09/16/2020 0959   HGB 13.7 07/27/2021 0829   HGB 14.2 09/22/2011 1500   HCT 40.9 07/27/2021 0829   HCT 42.4 09/22/2011 1500   PLT 261 07/27/2021 0829   MCV 93 07/27/2021 0829   MCV 93  09/22/2011 1500   MCH 31.3 07/27/2021 0829   MCH 31.1 12/17/2017 2253   MCHC 33.5 07/27/2021 0829   MCHC 32.9 09/16/2020 0959   RDW 12.1 07/27/2021 0829   RDW 12.9 09/22/2011 1500   Iron Studies No results found for: "IRON", "TIBC", "FERRITIN", "IRONPCTSAT" Lipid Panel     Component Value Date/Time   CHOL 127 07/27/2021  0829   TRIG 142 07/27/2021 0829   HDL 51 07/27/2021 0829   CHOLHDL 4 06/21/2018 0926   VLDL 25.2 06/21/2018 0926   LDLCALC 52 07/27/2021 0829   LDLDIRECT 169.3 06/28/2012 0931   Hepatic Function Panel     Component Value Date/Time   PROT 6.9 01/18/2022 0827   ALBUMIN 4.6 01/18/2022 0827   AST 26 01/18/2022 0827   ALT 25 01/18/2022 0827   ALKPHOS 82 01/18/2022 0827   BILITOT 0.3 01/18/2022 0827   BILIDIR 0.1 04/02/2014 0852      Component Value Date/Time   TSH 1.400 10/28/2020 1003   Nutritional Lab Results  Component Value Date   VD25OH 67.4 05/30/2022   VD25OH 38.2 01/18/2022   VD25OH 47.5 07/27/2021    Attestations:   Reviewed by clinician on day of visit: allergies, medications, problem list, medical history, surgical history, family history, social history, and previous encounter notes.   I,Special Puri,acting as a Neurosurgeon for [employer], DO.,have documented all relevant documentation on the behalf of Thomasene Lot, DO,as directed by  Thomasene Lot, DO while in the presence of Thomasene Lot, DO.   I, Thomasene Lot, DO, have reviewed all documentation for this visit. The documentation on 06/01/22 for the exam, diagnosis, procedures, and orders are all accurate and complete.

## 2022-06-22 ENCOUNTER — Ambulatory Visit (INDEPENDENT_AMBULATORY_CARE_PROVIDER_SITE_OTHER): Payer: Medicare Other | Admitting: Family Medicine

## 2022-06-22 ENCOUNTER — Encounter (INDEPENDENT_AMBULATORY_CARE_PROVIDER_SITE_OTHER): Payer: Self-pay | Admitting: Family Medicine

## 2022-06-22 VITALS — BP 132/77 | HR 63 | Temp 98.6°F | Ht 69.0 in | Wt 193.0 lb

## 2022-06-22 DIAGNOSIS — E559 Vitamin D deficiency, unspecified: Secondary | ICD-10-CM

## 2022-06-22 DIAGNOSIS — E65 Localized adiposity: Secondary | ICD-10-CM | POA: Diagnosis not present

## 2022-06-22 DIAGNOSIS — Z6828 Body mass index (BMI) 28.0-28.9, adult: Secondary | ICD-10-CM

## 2022-06-22 DIAGNOSIS — E669 Obesity, unspecified: Secondary | ICD-10-CM

## 2022-06-22 DIAGNOSIS — Z7984 Long term (current) use of oral hypoglycemic drugs: Secondary | ICD-10-CM

## 2022-06-22 DIAGNOSIS — E1169 Type 2 diabetes mellitus with other specified complication: Secondary | ICD-10-CM

## 2022-06-22 MED ORDER — VITAMIN D (ERGOCALCIFEROL) 1.25 MG (50000 UNIT) PO CAPS: 50000.0000 [IU] | ORAL_CAPSULE | ORAL | 0 refills | Status: DC

## 2022-06-22 NOTE — Progress Notes (Signed)
Vickie Nichols, D.O.  ABFM, ABOM Specializing in Clinical Bariatric Medicine  Office located at: [address] [place], [place]  [address]    Assessment and Plan:   Medications Discontinued During This Encounter  Medication Reason   Vitamin D, Ergocalciferol, (DRISDOL) 1.25 MG (50000 UNIT) CAPS capsule Reorder     Meds ordered this encounter  Medications   Vitamin D, Ergocalciferol, (DRISDOL) 1.25 MG (50000 UNIT) CAPS capsule    Sig: Take 1 capsule (50,000 Units total) by mouth every 7 (seven) days.    Dispense:  4 capsule    Refill:  0    30 d supply;  ** OV for RF **   Do not send RF request     Type 2 diabetes mellitus with morbid obesity (HCC) Assessment: Condition is improving, but not optimized. Her diabetes mellitus is being treated with Metformin 1000 mg BID. Denies any N/V/D.  Her hunger and cravings are controlled when eating according to the meal plan.   Lab Results  Component Value Date   HGBA1C 5.9 (H) 05/30/2022   HGBA1C 6.4 (H) 01/18/2022   HGBA1C 6.2 (H) 07/27/2021   INSULIN 16.6 01/18/2022   INSULIN 17.1 07/27/2021   INSULIN 24.5 02/21/2021    Plan: Continue with antidiabetic medication as recommended by PCP   Continue to decrease simple carbs/ sugars; increase fiber and proteins -> follow her meal plan. We will recheck A1c and fasting insulin level in approximately 3 months from last check, or as deemed appropriate.     Vitamin D deficiency Assessment: Condition is at goal. She is compliant with Ergocalciferol 50K IU weekly and is tolerating it well. Denies any adverse effects.  Lab Results  Component Value Date   VD25OH 67.4 05/30/2022   VD25OH 38.2 01/18/2022   VD25OH 47.5 07/27/2021   Plan: Continue with supplement. Will refill ERGO today.    Weight loss will likely improve availability of vitamin D, thus encouraged Vickie Nichols to continue with meal plan and their weight loss efforts to further improve this condition.  Thus, we will  need to monitor levels regularly (every 3-4 mo on average) to keep levels within normal limits and prevent over supplementation.   Visceral obesity Assessment: Condition is improving. Current visceral fat rating: 10. Starting Visceral fat was 13 on 10/28/20.   Plan: Counseling done on: The visceral fat rating should be < 12 in a female. Visceral adipose tissue is a hormonally active component of total body fat. This body composition phenotype is associated with medical disorders such as metabolic syndrome, cardiovascular disease and several malignancies including prostate, breast, and colorectal cancers. -Goal: Lose 5% of weight via prudent nutritional plan and lifestyle changes.     TREATMENT PLAN FOR OBESITY: OBESITY-WITH CURRENT BMI 28.49  (START BMI 33.30/DATE 10/28/20) Assessment: Vickie Nichols is here to discuss her progress with her obesity treatment plan along with follow-up of her obesity related diagnoses. See Medical Weight Management Flowsheet for complete bioelectrical impedance results.  Condition is improving, but not optimized. Biometric data collected today, was reviewed with patient.   Since last office visit on 06/01/22 patient's  Muscle mass has decreased by 1.8 lb. Fat mass has decreased by 3 lb. Total body water has decreased by 0.4 lb. Counseling done on how various foods will affect these numbers and how to maximize success  Total lbs lost to date: 26  Total weight loss percentage to date: 11.87   Her Obesity is also being managed with Semaglutide 1  mg once weekly and she is tolerating medication well. Denies any N/V/D.   Plan:  Pt advised to maintain with Semaglutide as prescribed by PCP.   Continue with the Category 2 meal plan with breakfast and lunch options and only 200 snack calories and if she desires journaling 1200-1300 calories and 80+ grams of protein daily.   Behavioral Intervention Additional resources provided today: patient  declined Evidence-based interventions for health behavior change were utilized today including the discussion of self monitoring techniques, problem-solving barriers and SMART goal setting techniques.   Regarding patient's less desirable eating habits and patterns, we employed the technique of small changes.  Pt will specifically work on: continue with journaling intake and current exercise regiment for next visit.    Recommended Physical Activity Goals  Vickie Nichols has been advised to slowly work up to 150 minutes of moderate intensity aerobic activity a week and strengthening exercises 2-3 times per week for cardiovascular health, weight loss maintenance and preservation of muscle mass.   She has agreed to Continue current level of physical activity   FOLLOW UP: Return in 3-4 wks x2. She was informed of the importance of frequent follow up visits to maximize her success with intensive lifestyle modifications for her multiple health conditions.  Subjective:   Chief complaint: Obesity Vickie Nichols is here to discuss her progress with her obesity treatment plan. She is on the the Category 2 Plan with breakfast and lunch options only 200 snack calories and keeping a food journal and adhering to recommended goals of 1200-1300 calories and 80+ protein and states she is following her eating plan approximately 75% of the time. She states she is walking and going to the gym (using machines) 60 minutes 3 days per week.  Interval History:  Vickie Nichols is here for a follow up office visit.   Since last OV, Vickie Nichols has been doing well. She recently returned from a pleasant vacation at [facility]. She endorses eating "what she is supposed to" and practicing mindful eating. Her hunger and cravings are well controlled when eating on plan.   Pharmacotherapy for weight loss: She is currently taking  Semaglutide  for medical weight loss.  Denies side effects.    Review of Systems:  Pertinent  positives were addressed with patient today.  Reviewed by clinician on day of visit: allergies, medications, problem list, medical history, surgical history, family history, social history, and previous encounter notes.  Weight Summary and Biometrics   Weight Lost Since Last Visit: 5lb  Weight Gained Since Last Visit: 0    Vitals Temp: 98.6 F (37 C) BP: 132/77 Pulse Rate: 63 SpO2: 97 %   Anthropometric Measurements Height: 5\' 9"  (1.753 m) Weight: 193 lb (87.5 kg) BMI (Calculated): 28.49 Weight at Last Visit: 198lb Weight Lost Since Last Visit: 5lb Weight Gained Since Last Visit: 0 Starting Weight: 219lb Total Weight Loss (lbs): 26 lb (11.8 kg) Peak Weight: 230lb   Body Composition  Body Fat %: 40 % Fat Mass (lbs): 77.2 lbs Muscle Mass (lbs): 110 lbs Total Body Water (lbs): 76.4 lbs Visceral Fat Rating : 10   Other Clinical Data Fasting: no Labs: no Today's Visit #: 28 Starting Date: 10/28/20   Objective:   PHYSICAL EXAM: Blood pressure 132/77, pulse 63, temperature 98.6 F (37 C), height 5\' 9"  (1.753 m), weight 193 lb (87.5 kg), SpO2 97 %. Body mass index is 28.5 kg/m.  General: Well Developed, well nourished, and in no acute distress.  HEENT: Normocephalic, atraumatic  Skin: Warm and dry, cap RF less 2 sec, good turgor Chest:  Normal excursion, shape, no gross abn Respiratory: speaking in full sentences, no conversational dyspnea NeuroM-Sk: Ambulates w/o assistance, moves * 4 Psych: A and O *3, insight good, mood-full  DIAGNOSTIC DATA REVIEWED:  BMET    Component Value Date/Time   NA 141 01/18/2022 0827   K 4.7 01/18/2022 0827   CL 103 01/18/2022 0827   CO2 24 01/18/2022 0827   GLUCOSE 111 (H) 01/18/2022 0827   GLUCOSE 132 (H) 10/01/2020 0810   BUN 14 01/18/2022 0827   CREATININE 0.82 01/18/2022 0827   CALCIUM 9.7 01/18/2022 0827   GFRNONAA 54 (L) 12/17/2017 2253   GFRAA >60 12/17/2017 2253   Lab Results  Component Value Date   HGBA1C  5.9 (H) 05/30/2022   HGBA1C 6.5 07/20/2010   Lab Results  Component Value Date   INSULIN 16.6 01/18/2022   INSULIN 19.5 10/28/2020   Lab Results  Component Value Date   TSH 1.400 10/28/2020   CBC    Component Value Date/Time   WBC 8.4 07/27/2021 0829   WBC 8.4 09/16/2020 0959   RBC 4.38 07/27/2021 0829   RBC 4.45 09/16/2020 0959   HGB 13.7 07/27/2021 0829   HGB 14.2 09/22/2011 1500   HCT 40.9 07/27/2021 0829   HCT 42.4 09/22/2011 1500   PLT 261 07/27/2021 0829   MCV 93 07/27/2021 0829   MCV 93 09/22/2011 1500   MCH 31.3 07/27/2021 0829   MCH 31.1 12/17/2017 2253   MCHC 33.5 07/27/2021 0829   MCHC 32.9 09/16/2020 0959   RDW 12.1 07/27/2021 0829   RDW 12.9 09/22/2011 1500   Iron Studies No results found for: "IRON", "TIBC", "FERRITIN", "IRONPCTSAT" Lipid Panel     Component Value Date/Time   CHOL 127 07/27/2021 0829   TRIG 142 07/27/2021 0829   HDL 51 07/27/2021 0829   CHOLHDL 4 06/21/2018 0926   VLDL 25.2 06/21/2018 0926   LDLCALC 52 07/27/2021 0829   LDLDIRECT 169.3 06/28/2012 0931   Hepatic Function Panel     Component Value Date/Time   PROT 6.9 01/18/2022 0827   ALBUMIN 4.6 01/18/2022 0827   AST 26 01/18/2022 0827   ALT 25 01/18/2022 0827   ALKPHOS 82 01/18/2022 0827   BILITOT 0.3 01/18/2022 0827   BILIDIR 0.1 04/02/2014 0852      Component Value Date/Time   TSH 1.400 10/28/2020 1003   Nutritional Lab Results  Component Value Date   VD25OH 67.4 05/30/2022   VD25OH 38.2 01/18/2022   VD25OH 47.5 07/27/2021    Attestations:   I, Special Puri, acting as a Stage manager for [employer], DO., have compiled all relevant documentation for today's office visit on behalf of Thomasene Lot, DO, while in the presence of [employer], DO.  I have reviewed the above documentation for accuracy and completeness, and I agree with the above. Vickie Nichols, D.O.  The 21st Century Cures Act was signed into law in 2016 which includes the topic of  electronic health records.  This provides immediate access to information in MyChart.  This includes consultation notes, operative notes, office notes, lab results and pathology reports.  If you have any questions about what you read please let us know at your next visit so we can discuss your concerns and take corrective action if need be.  We are right here with you.

## 2022-06-25 ENCOUNTER — Other Ambulatory Visit: Payer: Self-pay | Admitting: Family Medicine

## 2022-07-17 ENCOUNTER — Encounter: Payer: Self-pay | Admitting: Family Medicine

## 2022-07-17 DIAGNOSIS — E1169 Type 2 diabetes mellitus with other specified complication: Secondary | ICD-10-CM

## 2022-07-18 MED ORDER — ROSUVASTATIN CALCIUM 5 MG PO TABS: 2.5000 mg | ORAL_TABLET | Freq: Every day | ORAL | 2 refills | Status: DC

## 2022-07-19 ENCOUNTER — Encounter (INDEPENDENT_AMBULATORY_CARE_PROVIDER_SITE_OTHER): Payer: Self-pay | Admitting: Family Medicine

## 2022-07-19 ENCOUNTER — Ambulatory Visit (INDEPENDENT_AMBULATORY_CARE_PROVIDER_SITE_OTHER): Payer: Medicare Other | Admitting: Family Medicine

## 2022-07-19 VITALS — BP 125/72 | HR 74 | Temp 98.3°F | Ht 69.0 in | Wt 189.0 lb

## 2022-07-19 DIAGNOSIS — E65 Localized adiposity: Secondary | ICD-10-CM

## 2022-07-19 DIAGNOSIS — E559 Vitamin D deficiency, unspecified: Secondary | ICD-10-CM

## 2022-07-19 DIAGNOSIS — Z6827 Body mass index (BMI) 27.0-27.9, adult: Secondary | ICD-10-CM

## 2022-07-19 DIAGNOSIS — E1169 Type 2 diabetes mellitus with other specified complication: Secondary | ICD-10-CM | POA: Diagnosis not present

## 2022-07-19 DIAGNOSIS — E669 Obesity, unspecified: Secondary | ICD-10-CM

## 2022-07-19 DIAGNOSIS — Z7984 Long term (current) use of oral hypoglycemic drugs: Secondary | ICD-10-CM

## 2022-07-19 MED ORDER — VITAMIN D (ERGOCALCIFEROL) 1.25 MG (50000 UNIT) PO CAPS: 50000.0000 [IU] | ORAL_CAPSULE | ORAL | 0 refills | Status: DC

## 2022-07-19 NOTE — Progress Notes (Signed)
[name], D.O.  ABFM, ABOM Specializing in Clinical Bariatric Medicine  Office located at: [address] [place], [place]  [address]     Assessment and Plan:   Medications Discontinued During This Encounter  Medication Reason   Vitamin D, Ergocalciferol, (DRISDOL) 1.25 MG (50000 UNIT) CAPS capsule Reorder    Meds ordered this encounter  Medications   Vitamin D, Ergocalciferol, (DRISDOL) 1.25 MG (50000 UNIT) CAPS capsule    Sig: Take 1 capsule (50,000 Units total) by mouth every 7 (seven) days.    Dispense:  4 capsule    Refill:  0    30 d supply;  ** OV for RF **   Do not send RF request     Vitamin D deficiency Assessment: Condition is At goal. She continues ERGO and is compliant with taking it as a pill box helps her remember.  Lab Results  Component Value Date   VD25OH 67.4 05/30/2022   VD25OH 38.2 01/18/2022   VD25OH 47.5 07/27/2021   Plan: Continue Ergocalciferol 50K IU weekly as directed by Dr. Val Eagle. Will refill today.  - weight loss will likely improve availability of vitamin D. We will continue to monitor levels regularly every 3-4 mo on average to keep levels within normal limits and prevent over supplementation.   Visceral obesity Assessment: Condition is Controlled. Her current visceral fat rating is 10 today.   Plan: Continue her prudent nutritional plan that is low in simple carbohydrates, saturated fats and trans fats to goal of 5-10% weight loss to achieve significant health benefits.  Pt encouraged to continually advance exercise and cardiovascular fitness as tolerated throughout weight loss journey.   Type 2 diabetes mellitus with morbid obesity (HCC) Assessment: Condition is Improving, but not optimized. Her A1c level has improved from 6.4 on 01/18/2022 to 5.9 on 05/30/2022. She continues Metformin 1000mg  twice daily with meals.  Lab Results  Component Value Date   HGBA1C 5.9 (H) 05/30/2022   HGBA1C 6.4 (H) 01/18/2022   HGBA1C 6.2 (H)  07/27/2021   INSULIN 16.6 01/18/2022   INSULIN 17.1 07/27/2021   INSULIN 24.5 02/21/2021    Plan: Continue Metformin at current dose as directed by Dr. Val Eagle.  - Recheck labs in 3 months if not done at Endo provider / PCP.  - Importance of f/up with PCP and all other specialists, as scheduled, was stressed to the patient today    TREATMENT PLAN FOR OBESITY: BMI 27.0-27.9,adult- current BMI 27.9 OBESITY-WITH CURRENT BMI  28.49  (START BMI 33.30/DATE 10/28/20) Assessment:  KADELYN DIMASCIO is here to discuss her progress with her obesity treatment plan along with follow-up of her obesity related diagnoses. See Medical Weight Management Flowsheet for complete bioelectrical impedance results.  Condition is docourse: improvingbut not optimized. Biometric data collected today, was reviewed with patient.   Since last office visit on 06/22/2022 patient's  Muscle mass has decreased by 1.2lb. Fat mass has decreased by 2.8lb. Total body water has decreased by 0.6lb.  Counseling done on how various foods will affect these numbers and how to maximize success  Total lbs lost to date: 30 Total weight loss percentage to date: 13.70  Plan: Continue Semaglutide 1mg  once weekly as prescribed by PCP.   Continue with the Category 2 meal plan with breakfast and lunch options and only 200 snack calories and if she desires journaling 1200-1300 calories and 80+ grams of protein daily.    Behavioral Intervention Additional resources provided today: protein equivalence handout Evidence-based interventions  for health behavior change were utilized today including the discussion of self monitoring techniques, problem-solving barriers and SMART goal setting techniques.   Regarding patient's less desirable eating habits and patterns, we employed the technique of small changes.  Pt will specifically work on: Not skip meals ex: breakfast or others, and continue to eat all foods for next visit.    Recommended Physical  Activity Goals Liann has been advised to slowly work up to 150 minutes of moderate intensity aerobic activity a week and strengthening exercises 2-3 times per week for cardiovascular health, weight loss maintenance and preservation of muscle mass.   She has agreed to Continue current level of physical activity   FOLLOW UP: Return in about 3 weeks (around 08/09/2022). She was informed of the importance of frequent follow up visits to maximize her success with intensive lifestyle modifications for her multiple health conditions.  Subjective:   Chief complaint: Obesity Kamyiah is here to discuss her progress with her obesity treatment plan. She is on the the Category 2 Plan with B and L options and 200 snack calories and keeping a food journal and adhering to recommended goals of 1200-1300 calories and 80+ protein and states she is following her eating plan approximately 75% of the time. She states she is exercising at the gym 60 minutes 6 days per week.  Interval History:  AMINATA BUFFALO is here for a follow up office visit. Since last OV she has been doing well. However, breakfast is her hardest challenge as she may skip it occasionally but will try to eat yogurt or boiled eggs for breakfast. She does endorse that it is difficult to count her calories and protein. She has enjoyed going to the gym so far and it has given her more energy and calmed her mood.   Pharmacotherapy for weight loss: She is currently taking  Semaglutide  for medical weight loss.  Denies side effects.    Review of Systems:  Pertinent positives were addressed with patient today.  Reviewed by clinician on day of visit: allergies, medications, problem list, medical history, surgical history, family history, social history, and previous encounter notes.  Weight Summary and Biometrics   Weight Lost Since Last Visit: 4lb  Weight Gained Since Last Visit: 0lb   Vitals Temp: 98.3 F (36.8 C) BP: 125/72 Pulse Rate:  74 SpO2: 94 %   Anthropometric Measurements Height: 5\' 9"  (1.753 m) Weight: 189 lb (85.7 kg) BMI (Calculated): 27.9 Weight at Last Visit: 193lb Weight Lost Since Last Visit: 4lb Weight Gained Since Last Visit: 0lb Starting Weight: 219lb Total Weight Loss (lbs): 30 lb (13.6 kg) Peak Weight: 230lb   Body Composition  Body Fat %: 39.4 % Fat Mass (lbs): 74.4 lbs Muscle Mass (lbs): 108.8 lbs Total Body Water (lbs): 75.8 lbs Visceral Fat Rating : 10   Other Clinical Data Fasting: no Labs: no Today's Visit #: 29 Starting Date: 10/28/20   Objective:   PHYSICAL EXAM: Blood pressure 125/72, pulse 74, temperature 98.3 F (36.8 C), height 5\' 9"  (1.753 m), weight 189 lb (85.7 kg), SpO2 94%. Body mass index is 27.91 kg/m.  General: Well Developed, well nourished, and in no acute distress.  HEENT: Normocephalic, atraumatic Skin: Warm and dry, cap RF less 2 sec, good turgor Chest:  Normal excursion, shape, no gross abn Respiratory: speaking in full sentences, no conversational dyspnea NeuroM-Sk: Ambulates w/o assistance, moves * 4 Psych: A and O *3, insight good, mood-full  DIAGNOSTIC DATA REVIEWED:  BMET  Component Value Date/Time   NA 141 01/18/2022 0827   K 4.7 01/18/2022 0827   CL 103 01/18/2022 0827   CO2 24 01/18/2022 0827   GLUCOSE 111 (H) 01/18/2022 0827   GLUCOSE 132 (H) 10/01/2020 0810   BUN 14 01/18/2022 0827   CREATININE 0.82 01/18/2022 0827   CALCIUM 9.7 01/18/2022 0827   GFRNONAA 54 (L) 12/17/2017 2253   GFRAA >60 12/17/2017 2253   Lab Results  Component Value Date   HGBA1C 5.9 (H) 05/30/2022   HGBA1C 6.5 07/20/2010   Lab Results  Component Value Date   INSULIN 16.6 01/18/2022   INSULIN 19.5 10/28/2020   Lab Results  Component Value Date   TSH 1.400 10/28/2020   CBC    Component Value Date/Time   WBC 8.4 07/27/2021 0829   WBC 8.4 09/16/2020 0959   RBC 4.38 07/27/2021 0829   RBC 4.45 09/16/2020 0959   HGB 13.7 07/27/2021 0829   HGB  14.2 09/22/2011 1500   HCT 40.9 07/27/2021 0829   HCT 42.4 09/22/2011 1500   PLT 261 07/27/2021 0829   MCV 93 07/27/2021 0829   MCV 93 09/22/2011 1500   MCH 31.3 07/27/2021 0829   MCH 31.1 12/17/2017 2253   MCHC 33.5 07/27/2021 0829   MCHC 32.9 09/16/2020 0959   RDW 12.1 07/27/2021 0829   RDW 12.9 09/22/2011 1500   Iron Studies No results found for: "IRON", "TIBC", "FERRITIN", "IRONPCTSAT" Lipid Panel     Component Value Date/Time   CHOL 127 07/27/2021 0829   TRIG 142 07/27/2021 0829   HDL 51 07/27/2021 0829   CHOLHDL 4 06/21/2018 0926   VLDL 25.2 06/21/2018 0926   LDLCALC 52 07/27/2021 0829   LDLDIRECT 169.3 06/28/2012 0931   Hepatic Function Panel     Component Value Date/Time   PROT 6.9 01/18/2022 0827   ALBUMIN 4.6 01/18/2022 0827   AST 26 01/18/2022 0827   ALT 25 01/18/2022 0827   ALKPHOS 82 01/18/2022 0827   BILITOT 0.3 01/18/2022 0827   BILIDIR 0.1 04/02/2014 0852      Component Value Date/Time   TSH 1.400 10/28/2020 1003   Nutritional Lab Results  Component Value Date   VD25OH 67.4 05/30/2022   VD25OH 38.2 01/18/2022   VD25OH 47.5 07/27/2021    Attestations:   This encounter took 40 total minutes of time including any pre-visit and post-visit time spent on this date of service, including taking a thorough history, reviewing any labs and/or imaging, reviewing prior notes, as well as documenting in the electronic health record on the date of service. Over 50% of that time was in direct face-to-face counseling and coordinating care for the patient today  I, Clinical biochemist, acting as a Stage manager for [employer], DO., have compiled all relevant documentation for today's office visit on behalf of Thomasene Lot, DO, while in the presence of [employer], DO.  I have reviewed the above documentation for accuracy and completeness, and I agree with the above. [name], D.O.  The 21st Century Cures Act was signed into law in 2016 which  includes the topic of electronic health records.  This provides immediate access to information in MyChart.  This includes consultation notes, operative notes, office notes, lab results and pathology reports.  If you have any questions about what you read please let us know at your next visit so we can discuss your concerns and take corrective action if need be.  We are right here with you.

## 2022-07-22 ENCOUNTER — Other Ambulatory Visit (INDEPENDENT_AMBULATORY_CARE_PROVIDER_SITE_OTHER): Payer: Self-pay | Admitting: Family Medicine

## 2022-07-22 DIAGNOSIS — E559 Vitamin D deficiency, unspecified: Secondary | ICD-10-CM

## 2022-08-09 ENCOUNTER — Ambulatory Visit (INDEPENDENT_AMBULATORY_CARE_PROVIDER_SITE_OTHER): Payer: Medicare Other | Admitting: Family Medicine

## 2022-08-09 ENCOUNTER — Encounter (INDEPENDENT_AMBULATORY_CARE_PROVIDER_SITE_OTHER): Payer: Self-pay | Admitting: Family Medicine

## 2022-08-09 DIAGNOSIS — Z7984 Long term (current) use of oral hypoglycemic drugs: Secondary | ICD-10-CM

## 2022-08-09 DIAGNOSIS — E559 Vitamin D deficiency, unspecified: Secondary | ICD-10-CM

## 2022-08-09 DIAGNOSIS — Z6827 Body mass index (BMI) 27.0-27.9, adult: Secondary | ICD-10-CM

## 2022-08-09 DIAGNOSIS — Z7985 Long-term (current) use of injectable non-insulin antidiabetic drugs: Secondary | ICD-10-CM

## 2022-08-09 DIAGNOSIS — E1169 Type 2 diabetes mellitus with other specified complication: Secondary | ICD-10-CM

## 2022-08-09 DIAGNOSIS — E785 Hyperlipidemia, unspecified: Secondary | ICD-10-CM

## 2022-08-09 DIAGNOSIS — Z6833 Body mass index (BMI) 33.0-33.9, adult: Secondary | ICD-10-CM

## 2022-08-09 MED ORDER — METFORMIN HCL ER 500 MG PO TB24: ORAL_TABLET | ORAL | 0 refills | Status: DC

## 2022-08-09 MED ORDER — VITAMIN D (ERGOCALCIFEROL) 1.25 MG (50000 UNIT) PO CAPS: 50000.0000 [IU] | ORAL_CAPSULE | ORAL | 0 refills | Status: DC

## 2022-08-09 NOTE — Progress Notes (Signed)
[name], D.O.  ABFM, ABOM Specializing in Clinical Bariatric Medicine  Office located at: [address] [place], [place]  [address]     Assessment and Plan:   Medications Discontinued During This Encounter  Medication Reason   metFORMIN (GLUCOPHAGE) 1000 MG tablet Dose change   Vitamin D, Ergocalciferol, (DRISDOL) 1.25 MG (50000 UNIT) CAPS capsule Reorder    Meds ordered this encounter  Medications   Vitamin D, Ergocalciferol, (DRISDOL) 1.25 MG (50000 UNIT) CAPS capsule    Sig: Take 1 capsule (50,000 Units total) by mouth every 7 (seven) days.    Dispense:  8 capsule    Refill:  0    60 d supply;  ** OV for RF **   Do not send RF request   metFORMIN (GLUCOPHAGE-XR) 500 MG 24 hr tablet    Sig: 1 po every day with food    Dispense:  60 tablet    Refill:  0     Vitamin D deficiency Assessment: Condition is Controlled. She has been complaint and tolerant with taking Ergocalciferol  and denies any adverse side effects.  Lab Results  Component Value Date   VD25OH 67.4 05/30/2022   VD25OH 38.2 01/18/2022   VD25OH 47.5 07/27/2021   Plan: - Continue with Ergocalciferol 50K IU weekly and 2000IU daily. Will refill today.   - Informed patient this may be a lifelong thing, and she was encouraged to continue to take the medicine until told otherwise.     Vickie Nichols to continue with meal plan and their weight loss efforts to further improve this condition. Thus, we will need to monitor levels regularly (every 3-4 mo on average) to keep levels within normal limits and prevent over supplementation. She will be due for a recheck towards the end of August.    Type 2 diabetes mellitus with morbid obesity (HCC) Assessment: Condition is Improving, but not optimized. She continues to take Metformin once daily and tolerates this well with no GI upset. She also continues Semaglutide weekly with no adverse side effects. She denies having hunger and cravings. Pt rarely checks her  blood sugar and still occasionally skips breakfast.  Lab Results  Component Value Date   HGBA1C 5.9 (H) 05/30/2022   HGBA1C 6.4 (H) 01/18/2022   HGBA1C 6.2 (H) 07/27/2021   INSULIN 16.6 01/18/2022   INSULIN 17.1 07/27/2021   INSULIN 24.5 02/21/2021    Plan: - Switch from Metformin 1000 twice daily to Metformin 500XL with breakfast or lunch.   - Continue Semaglutide 1mg  once weekly.   - Advised to regularly check her blood sugar.    - We discussed the importance of decreasing simple carbs, increasing proteins and how certain foods they eat will affect their blood sugars  - Recheck labs in 3 months if not done at Endo provider / PCP.    TREATMENT PLAN FOR OBESITY: BMI 27.0-27.9,adult- current BMI 27.45 OBESITY-WITH CURRENT BMI  28.49  (START BMI 33.30/DATE 10/28/20) Assessment:  Vickie Nichols is here to discuss her progress with her obesity treatment plan along with follow-up of her obesity related diagnoses. See Medical Weight Management Flowsheet for complete bioelectrical impedance results.  Condition is docourse: improving. Biometric data collected today, was reviewed with patient.   Since last office visit on 07/19/2022 patient's  Muscle mass has not changed. Fat mass has decreased by 2.2lb. Total body water has decreased by 1.2lb.  Counseling done on how various foods will affect these numbers and how to maximize  success  Total lbs lost to date: 33lbs Total weight loss percentage to date: 15.07%   Plan: - Continue to follow the prescribed category 2 meal plan with B and L options with only 100 snack calories. Keep a food journal and adhering to recommended goals of 1200-1300 calories and 80+ protein   - Unless pre-existing renal or cardiopulmonary conditions exist which patient was told to limit their fluid intake by another provider, I recommended roughly one half of their weight in pounds, to be the approximate ounces of non-caloric, non-caffeinated beverages they should  drink per day; including more if they are engaging in exercise.   Behavioral Intervention Additional resources provided today: patient declined Evidence-based interventions for health behavior change were utilized today including the discussion of self monitoring techniques, problem-solving barriers and SMART goal setting techniques.   Regarding patient's less desirable eating habits and patterns, we employed the technique of small changes.  Pt will specifically work on: eating protein with each meal, not skipping foods, and drinking at least 90oz of water daily for next visit.    Recommended Physical Activity Goals  Vickie Nichols has been advised to slowly work up to 150 minutes of moderate intensity aerobic activity a week and strengthening exercises 2-3 times per week for cardiovascular health, weight loss maintenance and preservation of muscle mass.   She has agreed to Continue current level of physical activity   FOLLOW UP: Return in about 6 weeks (around 09/20/2022). She was informed of the importance of frequent follow up visits to maximize her success with intensive lifestyle modifications for her multiple health conditions.  Subjective:   Chief complaint: Obesity Vickie Nichols is here to discuss her progress with her obesity treatment plan. She is on the the Category 2 Plan with B and L options with only 100 snack calories and keeping a food journal and adhering to recommended goals of 1200-1300 calories and 80+ protein and states she is following her eating plan approximately 75% of the time. She states she is walking and going to the gym using machine weights for 60 minutes 5 days per week.  Interval History:  Vickie Nichols is here for a follow up office visit. Since last OV she has continued to follow the prescribed meal plan. She has been having the mindset of "would I whether eat this one bad thing or this one good thing versus the consequence". She recently got an airfryer to make  healthier less grease saturated foods.    Pharmacotherapy for weight loss: She is currently taking Semaglutide  for medical weight loss.  Denies side effects.    Review of Systems:  Pertinent positives were addressed with patient today.   Reviewed by clinician on day of visit: allergies, medications, problem list, medical history, surgical history, family history, social history, and previous encounter notes.  Weight Summary and Biometrics   Weight Lost Since Last Visit: 3lb  Weight Gained Since Last Visit: 0lb   Vitals Temp: 98.1 F (36.7 C) BP: (!) 140/72 Pulse Rate: 76 SpO2: 96 %   Anthropometric Measurements Height: 5\' 9"  (1.753 m) Weight: 186 lb (84.4 kg) BMI (Calculated): 27.45 Weight at Last Visit: 189lb Weight Lost Since Last Visit: 3lb Weight Gained Since Last Visit: 0lb Starting Weight: 219lb Total Weight Loss (lbs): 33 lb (15 kg) Peak Weight: 230lb   Body Composition  Body Fat %: 38.6 % Fat Mass (lbs): 72.2 lbs Muscle Mass (lbs): 108.8 lbs Total Body Water (lbs): 74.6 lbs Visceral Fat Rating : 10  Other Clinical Data Fasting: no Labs: no Today's Visit #: 30 Starting Date: 10/28/20   Objective:   PHYSICAL EXAM: Blood pressure (!) 140/72, pulse 76, temperature 98.1 F (36.7 C), height 5\' 9"  (1.753 m), weight 186 lb (84.4 kg), SpO2 96%. Body mass index is 27.47 kg/m.  General: Well Developed, well nourished, and in no acute distress.  HEENT: Normocephalic, atraumatic Skin: Warm and dry, cap RF less 2 sec, good turgor Chest:  Normal excursion, shape, no gross abn Respiratory: speaking in full sentences, no conversational dyspnea NeuroM-Sk: Ambulates w/o assistance, moves * 4 Psych: A and O *3, insight good, mood-full  DIAGNOSTIC DATA REVIEWED:  BMET    Component Value Date/Time   NA 141 01/18/2022 0827   K 4.7 01/18/2022 0827   CL 103 01/18/2022 0827   CO2 24 01/18/2022 0827   GLUCOSE 111 (H) 01/18/2022 0827   GLUCOSE 132 (H)  10/01/2020 0810   BUN 14 01/18/2022 0827   CREATININE 0.82 01/18/2022 0827   CALCIUM 9.7 01/18/2022 0827   GFRNONAA 54 (L) 12/17/2017 2253   GFRAA >60 12/17/2017 2253   Lab Results  Component Value Date   HGBA1C 5.9 (H) 05/30/2022   HGBA1C 6.5 07/20/2010   Lab Results  Component Value Date   INSULIN 16.6 01/18/2022   INSULIN 19.5 10/28/2020   Lab Results  Component Value Date   TSH 1.400 10/28/2020   CBC    Component Value Date/Time   WBC 8.4 07/27/2021 0829   WBC 8.4 09/16/2020 0959   RBC 4.38 07/27/2021 0829   RBC 4.45 09/16/2020 0959   HGB 13.7 07/27/2021 0829   HGB 14.2 09/22/2011 1500   HCT 40.9 07/27/2021 0829   HCT 42.4 09/22/2011 1500   PLT 261 07/27/2021 0829   MCV 93 07/27/2021 0829   MCV 93 09/22/2011 1500   MCH 31.3 07/27/2021 0829   MCH 31.1 12/17/2017 2253   MCHC 33.5 07/27/2021 0829   MCHC 32.9 09/16/2020 0959   RDW 12.1 07/27/2021 0829   RDW 12.9 09/22/2011 1500   Iron Studies No results found for: "IRON", "TIBC", "FERRITIN", "IRONPCTSAT" Lipid Panel     Component Value Date/Time   CHOL 127 07/27/2021 0829   TRIG 142 07/27/2021 0829   HDL 51 07/27/2021 0829   CHOLHDL 4 06/21/2018 0926   VLDL 25.2 06/21/2018 0926   LDLCALC 52 07/27/2021 0829   LDLDIRECT 169.3 06/28/2012 0931   Hepatic Function Panel     Component Value Date/Time   PROT 6.9 01/18/2022 0827   ALBUMIN 4.6 01/18/2022 0827   AST 26 01/18/2022 0827   ALT 25 01/18/2022 0827   ALKPHOS 82 01/18/2022 0827   BILITOT 0.3 01/18/2022 0827   BILIDIR 0.1 04/02/2014 0852      Component Value Date/Time   TSH 1.400 10/28/2020 1003   Nutritional Lab Results  Component Value Date   VD25OH 67.4 05/30/2022   VD25OH 38.2 01/18/2022   VD25OH 47.5 07/27/2021    Attestations:   This encounter took 40 total minutes of time including any pre-visit and post-visit time spent on this date of service, including taking a thorough history, reviewing any labs and/or imaging, reviewing prior  notes, as well as documenting in the electronic health record on the date of service. Over 50% of that time was in direct face-to-face counseling and coordinating care for the patient today  I, Clinical biochemist, acting as a Stage manager for [employer], DO., have compiled all relevant documentation for today's office visit on behalf  of [employer], DO, while in the presence of [employer], DO.  I have reviewed the above documentation for accuracy and completeness, and I agree with the above. [name], D.O.  The 21st Century Cures Act was signed into law in 2016 which includes the topic of electronic health records.  This provides immediate access to information in MyChart.  This includes consultation notes, operative notes, office notes, lab results and pathology reports.  If you have any questions about what you read please let us know at your next visit so we can discuss your concerns and take corrective action if need be.  We are right here with you.

## 2022-08-11 ENCOUNTER — Encounter: Payer: Self-pay | Admitting: Family Medicine

## 2022-08-11 DIAGNOSIS — E1169 Type 2 diabetes mellitus with other specified complication: Secondary | ICD-10-CM

## 2022-08-15 MED ORDER — BLOOD GLUCOSE METER KIT: PACK | 0 refills | Status: AC

## 2022-08-31 ENCOUNTER — Ambulatory Visit (INDEPENDENT_AMBULATORY_CARE_PROVIDER_SITE_OTHER): Payer: Medicare Other | Admitting: Family Medicine

## 2022-09-13 ENCOUNTER — Ambulatory Visit (INDEPENDENT_AMBULATORY_CARE_PROVIDER_SITE_OTHER): Payer: Medicare Other | Admitting: Family Medicine

## 2022-09-19 ENCOUNTER — Other Ambulatory Visit: Payer: Self-pay | Admitting: Family Medicine

## 2022-09-19 DIAGNOSIS — E1169 Type 2 diabetes mellitus with other specified complication: Secondary | ICD-10-CM

## 2022-09-20 ENCOUNTER — Other Ambulatory Visit: Payer: Self-pay | Admitting: Family Medicine

## 2022-09-20 DIAGNOSIS — E1169 Type 2 diabetes mellitus with other specified complication: Secondary | ICD-10-CM

## 2022-10-05 ENCOUNTER — Encounter (INDEPENDENT_AMBULATORY_CARE_PROVIDER_SITE_OTHER): Payer: Self-pay | Admitting: Family Medicine

## 2022-10-05 ENCOUNTER — Ambulatory Visit (INDEPENDENT_AMBULATORY_CARE_PROVIDER_SITE_OTHER): Payer: Medicare Other | Admitting: Family Medicine

## 2022-10-05 VITALS — BP 126/66 | HR 72 | Temp 98.4°F | Ht 69.0 in | Wt 181.0 lb

## 2022-10-05 DIAGNOSIS — E538 Deficiency of other specified B group vitamins: Secondary | ICD-10-CM

## 2022-10-05 DIAGNOSIS — Z6827 Body mass index (BMI) 27.0-27.9, adult: Secondary | ICD-10-CM

## 2022-10-05 DIAGNOSIS — Z7984 Long term (current) use of oral hypoglycemic drugs: Secondary | ICD-10-CM

## 2022-10-05 DIAGNOSIS — E785 Hyperlipidemia, unspecified: Secondary | ICD-10-CM | POA: Diagnosis not present

## 2022-10-05 DIAGNOSIS — E1169 Type 2 diabetes mellitus with other specified complication: Secondary | ICD-10-CM | POA: Diagnosis not present

## 2022-10-05 DIAGNOSIS — E669 Obesity, unspecified: Secondary | ICD-10-CM

## 2022-10-05 DIAGNOSIS — E66811 Obesity, class 1: Secondary | ICD-10-CM

## 2022-10-05 DIAGNOSIS — E559 Vitamin D deficiency, unspecified: Secondary | ICD-10-CM

## 2022-10-05 DIAGNOSIS — Z6826 Body mass index (BMI) 26.0-26.9, adult: Secondary | ICD-10-CM

## 2022-10-05 MED ORDER — VITAMIN D3 50 MCG (2000 UT) PO CAPS
2000.0000 [IU] | ORAL_CAPSULE | Freq: Every day | ORAL | Status: DC
Start: 2022-10-05 — End: 2023-05-14

## 2022-10-05 MED ORDER — CYANOCOBALAMIN 500 MCG PO TABS: ORAL_TABLET | ORAL | Status: DC

## 2022-10-05 MED ORDER — VITAMIN D (ERGOCALCIFEROL) 1.25 MG (50000 UNIT) PO CAPS: 50000.0000 [IU] | ORAL_CAPSULE | ORAL | 0 refills | Status: DC

## 2022-10-05 MED ORDER — METFORMIN HCL ER 500 MG PO TB24: ORAL_TABLET | ORAL | 0 refills | Status: DC

## 2022-10-05 NOTE — Progress Notes (Signed)
Vickie Nichols, D.O.  ABFM, ABOM Specializing in Clinical Bariatric Medicine  Office located at: [address] [place], [place]  [address]     Assessment and Plan:   Orders Placed This Encounter  Procedures   Vitamin B12   VITAMIN D 25 Hydroxy (Vit-D Deficiency, Fractures)   Lipid panel   Hemoglobin A1c   LDL cholesterol, direct   Comprehensive metabolic panel    Medications Discontinued During This Encounter  Medication Reason   Cholecalciferol (VITAMIN D3) 50 MCG (2000 UT) CAPS Reorder   cyanocobalamin (VITAMIN B12) 500 MCG tablet Reorder   Vitamin D, Ergocalciferol, (DRISDOL) 1.25 MG (50000 UNIT) CAPS capsule Reorder   metFORMIN (GLUCOPHAGE-XR) 500 MG 24 hr tablet Reorder     Meds ordered this encounter  Medications   metFORMIN (GLUCOPHAGE-XR) 500 MG 24 hr tablet    Sig: 1 po every day with food    Dispense:  60 tablet    Refill:  0   Vitamin D, Ergocalciferol, (DRISDOL) 1.25 MG (50000 UNIT) CAPS capsule    Sig: Take 1 capsule (50,000 Units total) by mouth every 7 (seven) days.    Dispense:  8 capsule    Refill:  0    60 d supply;  ** OV for RF **   Do not send RF request   cyanocobalamin (VITAMIN B12) 500 MCG tablet    Sig: 300 mcg- 500 mcg qd   Cholecalciferol (VITAMIN D3) 50 MCG (2000 UT) capsule    Sig: Take 1 capsule (2,000 Units total) by mouth daily.     Type 2 diabetes mellitus with morbid obesity (HCC) Assessment & Plan: Last Hemoglobin A1c on 05/30/22 was stable at 5.9. Pt prescribed Metformin XR 500 mg once daily for her T2DM. Reports that she forgets to take her Metformin XR on some days. On those days she forgets, she endorses taking Metformin 1,000 mg bid. Reports that her hunger is controlled, once in a while has sugar cravings. Will refill Metformin today - reminded pt that drug can slow progression of diabetes and preserve beta cells of pancreas.     Hyperlipidemia associated with type 2 diabetes mellitus (HCC) Assessment &  Plan: Hyperlipidemia treated with Crestor 2.5 mg once daily. Last Lipid panel was obtained on 07/27/2021. Will recheck lipid panel and obtain LDL direct as part of routine screening. Continue with statin therapy per PCP and our heart healthy, low cholesterol meal plan.    Vitamin B12 deficiency Assessment & Plan: Last B12 levels were 654 on 01/18/22. Currently on OTC Cyanocobalamin 500 mcg every day without any adverse SE. Continue with current supplementation regiment. Will recheck B12 levels today.    Vitamin D deficiency Assessment & Plan: Last vitamin D levels were 67.4 on 05/30/22. Endorses taking Rx high-dose vitamin D 50,000 units once every 7 days & OTC vitamin D 2,000 units daily. Continue with their weight loss efforts and current supplementation regiment  - Will refill ERGO today. Recheck vitamin D today.    BMI 27.0-27.9,adult- current BMI 26.72 OBESITY-WITH CURRENT BMI  28.49  (START BMI 33.30/DATE 10/28/20) Assessment & Plan: Since last office visit on 08/09/22 patient's muscle mass has decreased by 2.8 lb. Fat mass has decreased by 2.8 lb. Total body water has decreased by 0.8 lb.  Counseling done on how various foods will affect these numbers and how to maximize success  Total lbs lost to date: 38 lbs  Total weight loss percentage to date: 17.35%   Currently on Ozempic 1  mg once a week without any adverse SE. Continue with GLP-1 therapy per PCP.   Educated patient about the 10:1 ratio of calories to grams of protein as a general rule of thumb.   Recommended pt to sometimes try the Red[employer] Breakfast Sandwich.  No change to meal plan - see Subjective  Behavioral Intervention Additional resources provided today: n/a  Evidence-based interventions for health behavior change were utilized today including the discussion of self monitoring techniques, problem-solving barriers and SMART goal setting techniques.   Regarding patient's less desirable eating habits and patterns, we  employed the technique of small changes.  Pt will specifically work on: n/a    FOLLOW UP: Return 4-5 wks. She was informed of the importance of frequent follow up visits to maximize her success with intensive lifestyle modifications for her multiple health conditions.  Vickie Nichols is aware that we will review all of her lab results at our next visit.  She is aware that if anything is critical/ life threatening with the results, we will be contacting her via MyChart prior to the office visit to discuss management.   Subjective:   Chief complaint: Obesity Vickie Nichols is here to discuss her progress with her obesity treatment plan. She is on the Category 2 Plan with only 100 snack calories and keeping a food journal and adhering to recommended goals of 1200-1300 calories and 80+ protein and states she is following her eating plan approximately 50% of the time. She states she has not been exercising in the past 3 weeks.   Interval History:  Vickie Nichols is here for a follow up office visit. Since last OV,  Vickie Nichols has been doing well. Endorses being busy with taking care of her grandchildren. For breakfast, pt has been having a Location manager breakfast sandwich with canadian bacon (260 cal, 15 grams protein). Pt is motivated to return to the gym and increase her adherence to the meal plan to 90%.   Pharmacotherapy for weight loss: She is currently taking  Wellbutrin XL 300 mg daily, Metformin XR 500 mg daily, & Semaglutide 1 mg once a week  with adequate clinical response. Denies side effects.    Review of Systems:  Pertinent positives were addressed with patient today.  Reviewed by clinician on day of visit: allergies, medications, problem list, medical history, surgical history, family history, social history, and previous encounter notes.  Weight Summary and Biometrics   Weight Lost Since Last Visit: 5lb  Weight Gained Since Last Visit: 0lb    Vitals Temp: 98.4 F (36.9 C) BP:  126/66 Pulse Rate: 72 SpO2: 97 %   Anthropometric Measurements Height: 5\' 9"  (1.753 m) Weight: 181 lb (82.1 kg) BMI (Calculated): 26.72 Weight at Last Visit: 186lb Weight Lost Since Last Visit: 5lb Weight Gained Since Last Visit: 0lb Starting Weight: 219lb Total Weight Loss (lbs): 38 lb (17.2 kg) Peak Weight: 230lb   Body Composition  Body Fat %: 38.3 % Fat Mass (lbs): 69.4 lbs Muscle Mass (lbs): 106 lbs Total Body Water (lbs): 7.8 lbs Visceral Fat Rating : 10   Other Clinical Data Fasting: no Labs: no Today's Visit #: 31 Starting Date: 10/28/20   Objective:   PHYSICAL EXAM: Blood pressure 126/66, pulse 72, temperature 98.4 F (36.9 C), height 5\' 9"  (1.753 m), weight 181 lb (82.1 kg), SpO2 97%. Body mass index is 26.73 kg/m.  General: Well Developed, well nourished, and in no acute distress.  HEENT: Normocephalic, atraumatic Skin: Warm and dry, cap RF less 2  sec, good turgor Chest:  Normal excursion, shape, no gross abn Respiratory: speaking in full sentences, no conversational dyspnea NeuroM-Sk: Ambulates w/o assistance, moves * 4 Psych: A and O *3, insight good, mood-full  DIAGNOSTIC DATA REVIEWED:  BMET    Component Value Date/Time   NA 141 01/18/2022 0827   K 4.7 01/18/2022 0827   CL 103 01/18/2022 0827   CO2 24 01/18/2022 0827   GLUCOSE 111 (H) 01/18/2022 0827   GLUCOSE 132 (H) 10/01/2020 0810   BUN 14 01/18/2022 0827   CREATININE 0.82 01/18/2022 0827   CALCIUM 9.7 01/18/2022 0827   GFRNONAA 54 (L) 12/17/2017 2253   GFRAA >60 12/17/2017 2253   Lab Results  Component Value Date   HGBA1C 5.9 (H) 05/30/2022   HGBA1C 6.5 07/20/2010   Lab Results  Component Value Date   INSULIN 16.6 01/18/2022   INSULIN 19.5 10/28/2020   Lab Results  Component Value Date   TSH 1.400 10/28/2020   CBC    Component Value Date/Time   WBC 8.4 07/27/2021 0829   WBC 8.4 09/16/2020 0959   RBC 4.38 07/27/2021 0829   RBC 4.45 09/16/2020 0959   HGB 13.7  07/27/2021 0829   HGB 14.2 09/22/2011 1500   HCT 40.9 07/27/2021 0829   HCT 42.4 09/22/2011 1500   PLT 261 07/27/2021 0829   MCV 93 07/27/2021 0829   MCV 93 09/22/2011 1500   MCH 31.3 07/27/2021 0829   MCH 31.1 12/17/2017 2253   MCHC 33.5 07/27/2021 0829   MCHC 32.9 09/16/2020 0959   RDW 12.1 07/27/2021 0829   RDW 12.9 09/22/2011 1500   Iron Studies No results found for: "IRON", "TIBC", "FERRITIN", "IRONPCTSAT" Lipid Panel     Component Value Date/Time   CHOL 127 07/27/2021 0829   TRIG 142 07/27/2021 0829   HDL 51 07/27/2021 0829   CHOLHDL 4 06/21/2018 0926   VLDL 25.2 06/21/2018 0926   LDLCALC 52 07/27/2021 0829   LDLDIRECT 169.3 06/28/2012 0931   Hepatic Function Panel     Component Value Date/Time   PROT 6.9 01/18/2022 0827   ALBUMIN 4.6 01/18/2022 0827   AST 26 01/18/2022 0827   ALT 25 01/18/2022 0827   ALKPHOS 82 01/18/2022 0827   BILITOT 0.3 01/18/2022 0827   BILIDIR 0.1 04/02/2014 0852      Component Value Date/Time   TSH 1.400 10/28/2020 1003   Nutritional Lab Results  Component Value Date   VD25OH 67.4 05/30/2022   VD25OH 38.2 01/18/2022   VD25OH 47.5 07/27/2021    Attestations:   I, Special Puri, acting as a Stage manager for [employer], DO., have compiled all relevant documentation for today's office visit on behalf of Thomasene Lot, DO, while in the presence of [employer], DO.  I have reviewed the above documentation for accuracy and completeness, and I agree with the above. Vickie Nichols, D.O.  The 21st Century Cures Act was signed into law in 2016 which includes the topic of electronic health records.  This provides immediate access to information in MyChart.  This includes consultation notes, operative notes, office notes, lab results and pathology reports.  If you have any questions about what you read please let us know at your next visit so we can discuss your concerns and take corrective action if need be.  We are right here  with you.

## 2022-10-06 LAB — HEMOGLOBIN A1C
Est. average glucose Bld gHb Est-mCnc: 108 mg/dL
Hgb A1c MFr Bld: 5.4 % (ref 4.8–5.6)

## 2022-10-06 LAB — COMPREHENSIVE METABOLIC PANEL
ALT: 13 [IU]/L (ref 0–32)
AST: 20 [IU]/L (ref 0–40)
Albumin: 4.5 g/dL (ref 3.9–4.9)
Alkaline Phosphatase: 82 [IU]/L (ref 44–121)
BUN/Creatinine Ratio: 10 — ABNORMAL LOW (ref 12–28)
BUN: 10 mg/dL (ref 8–27)
Bilirubin Total: 0.3 mg/dL (ref 0.0–1.2)
CO2: 26 mmol/L (ref 20–29)
Calcium: 9.7 mg/dL (ref 8.7–10.3)
Chloride: 102 mmol/L (ref 96–106)
Creatinine, Ser: 0.96 mg/dL (ref 0.57–1.00)
Globulin, Total: 2.3 g/dL (ref 1.5–4.5)
Glucose: 85 mg/dL (ref 70–99)
Potassium: 4.6 mmol/L (ref 3.5–5.2)
Sodium: 142 mmol/L (ref 134–144)
Total Protein: 6.8 g/dL (ref 6.0–8.5)
eGFR: 65 mL/min/{1.73_m2} (ref >59)

## 2022-10-06 LAB — LIPID PANEL
Chol/HDL Ratio: 2.1 {ratio} (ref 0.0–4.4)
Cholesterol, Total: 104 mg/dL (ref 100–199)
HDL: 50 mg/dL (ref >39)
LDL Chol Calc (NIH): 36 mg/dL (ref 0–99)
Triglycerides: 97 mg/dL (ref 0–149)
VLDL Cholesterol Cal: 18 mg/dL (ref 5–40)

## 2022-10-06 LAB — VITAMIN D 25 HYDROXY (VIT D DEFICIENCY, FRACTURES): Vit D, 25-Hydroxy: 87.7 ng/mL (ref 30.0–100.0)

## 2022-10-06 LAB — LDL CHOLESTEROL, DIRECT: LDL Direct: 37 mg/dL (ref 0–99)

## 2022-10-06 LAB — VITAMIN B12: Vitamin B-12: 963 pg/mL (ref 232–1245)

## 2022-10-11 ENCOUNTER — Encounter: Payer: Self-pay | Admitting: Gastroenterology

## 2022-10-14 IMAGING — DX DG HIP (WITH OR WITHOUT PELVIS) 2V BILAT
5 series · 5 of 5 positions shown · non-contrast
Comparison: None.

CLINICAL DATA: One year of bilateral hip pain

EXAM:
DG HIP (WITH OR WITHOUT PELVIS) 2V BILAT

[pelvis ap]
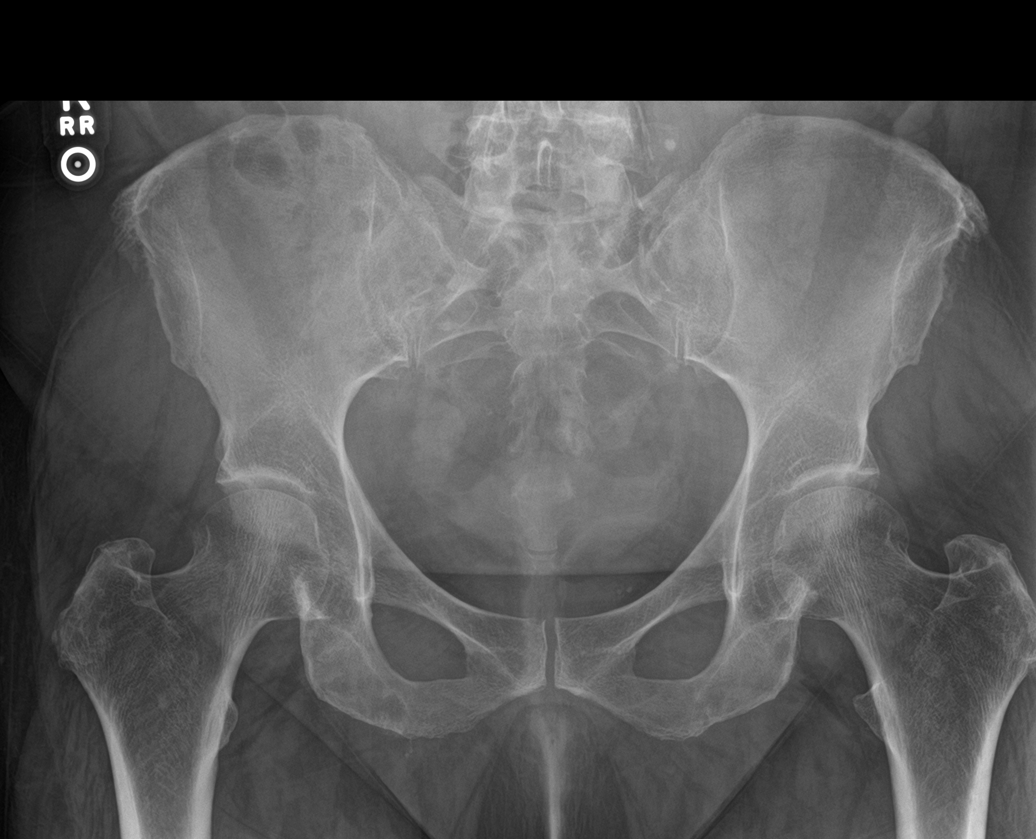

[hip ap (1 of 2)]
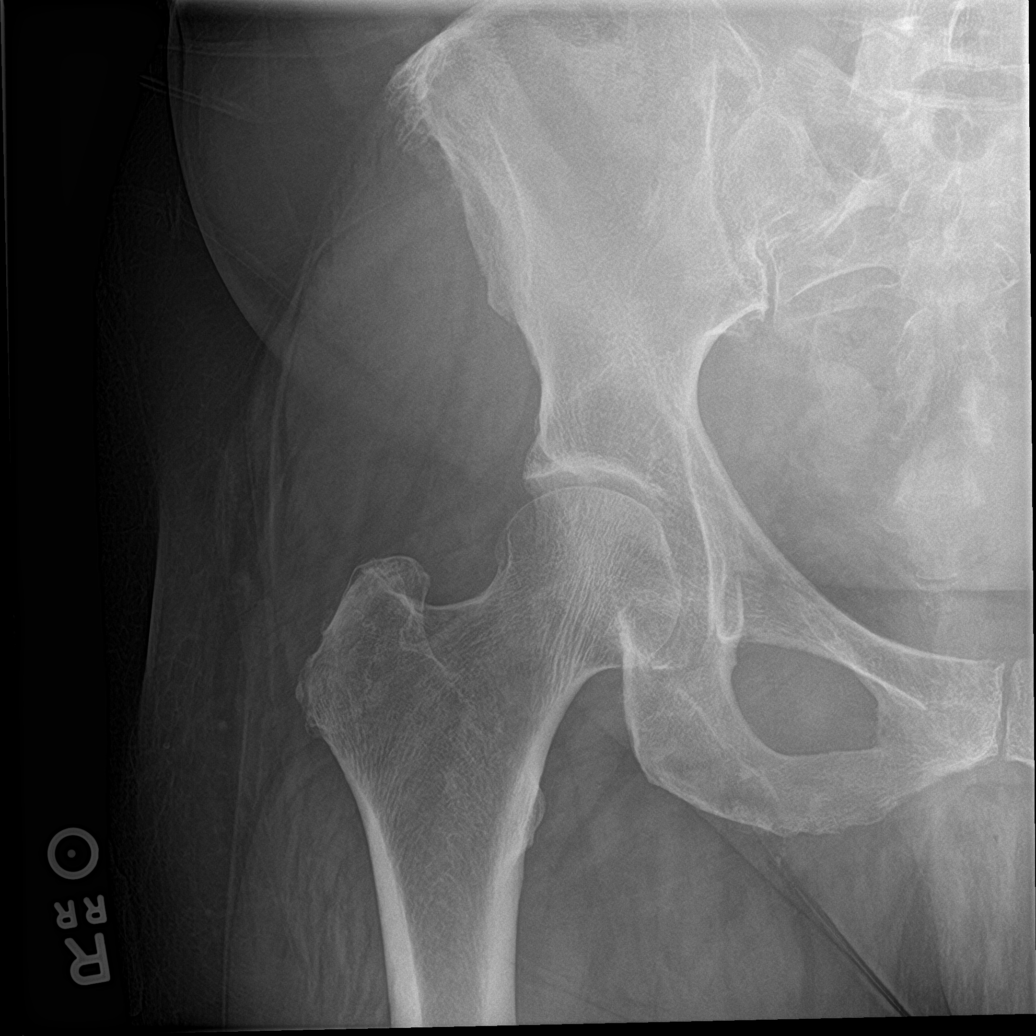

[hip lat (1 of 2)]
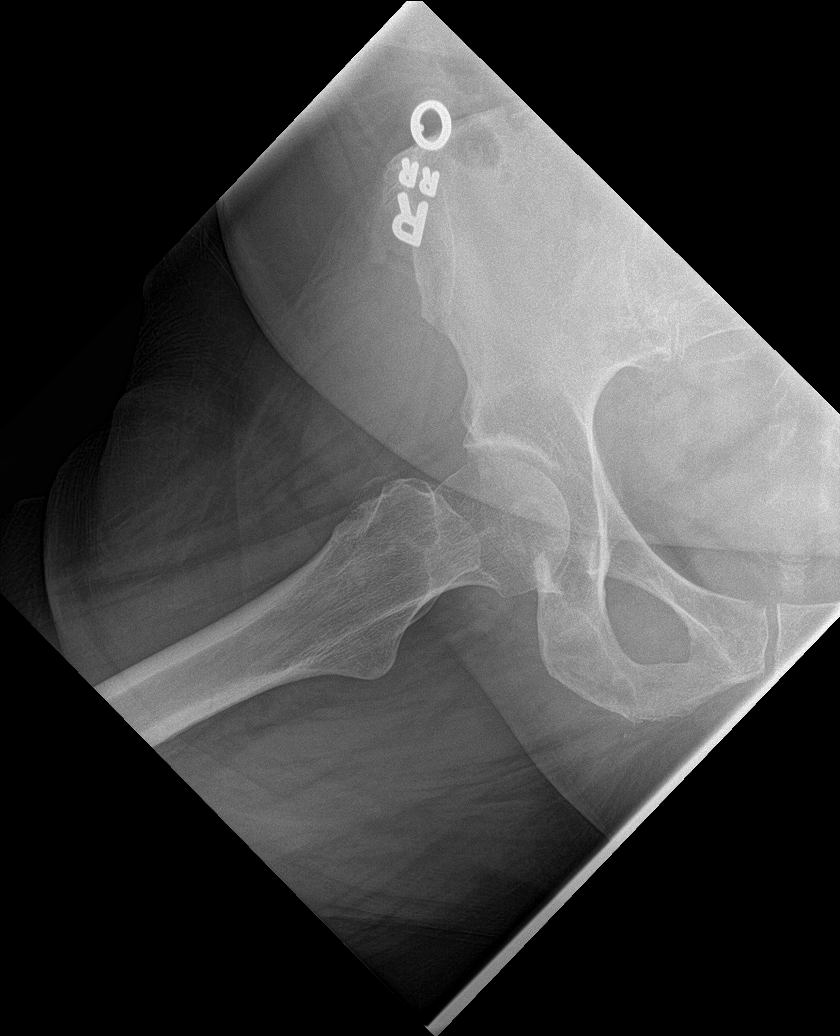

[hip ap (2 of 2)]
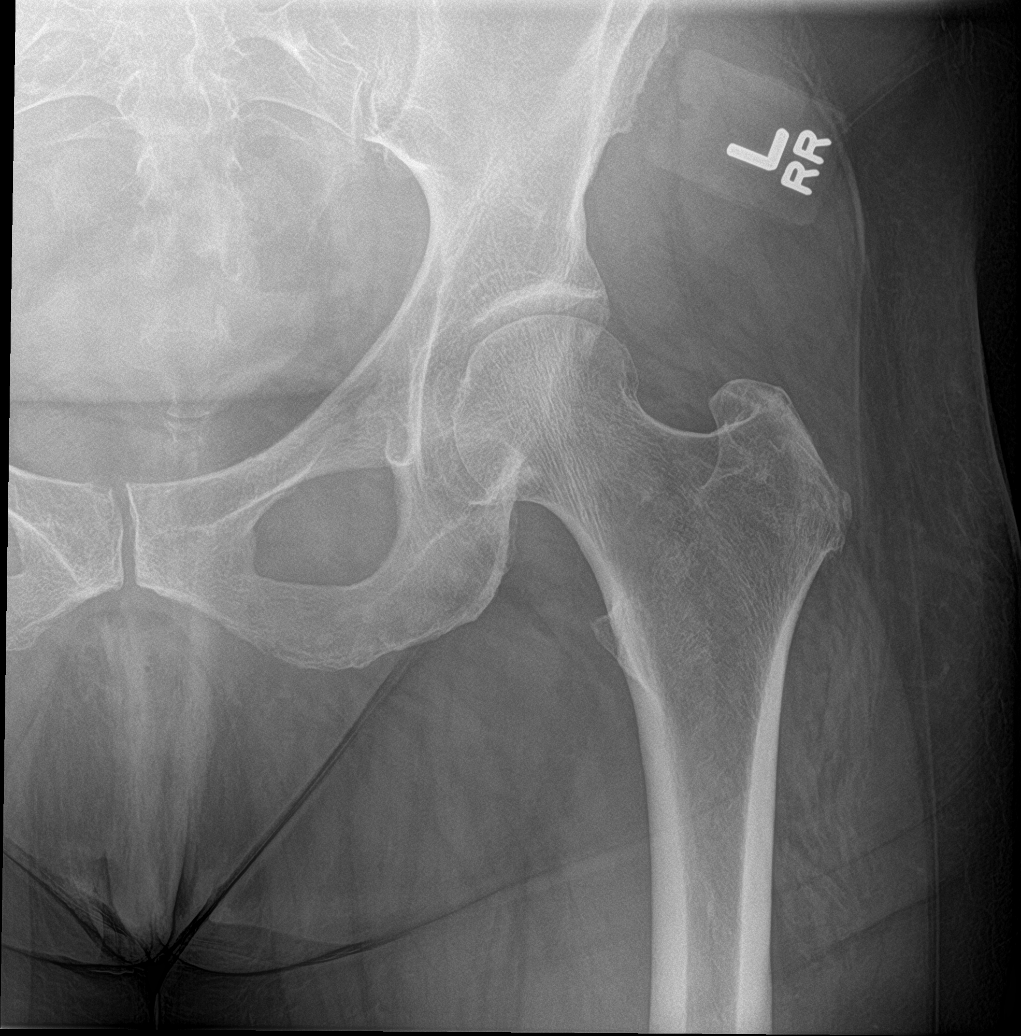

[hip lat (2 of 2)]
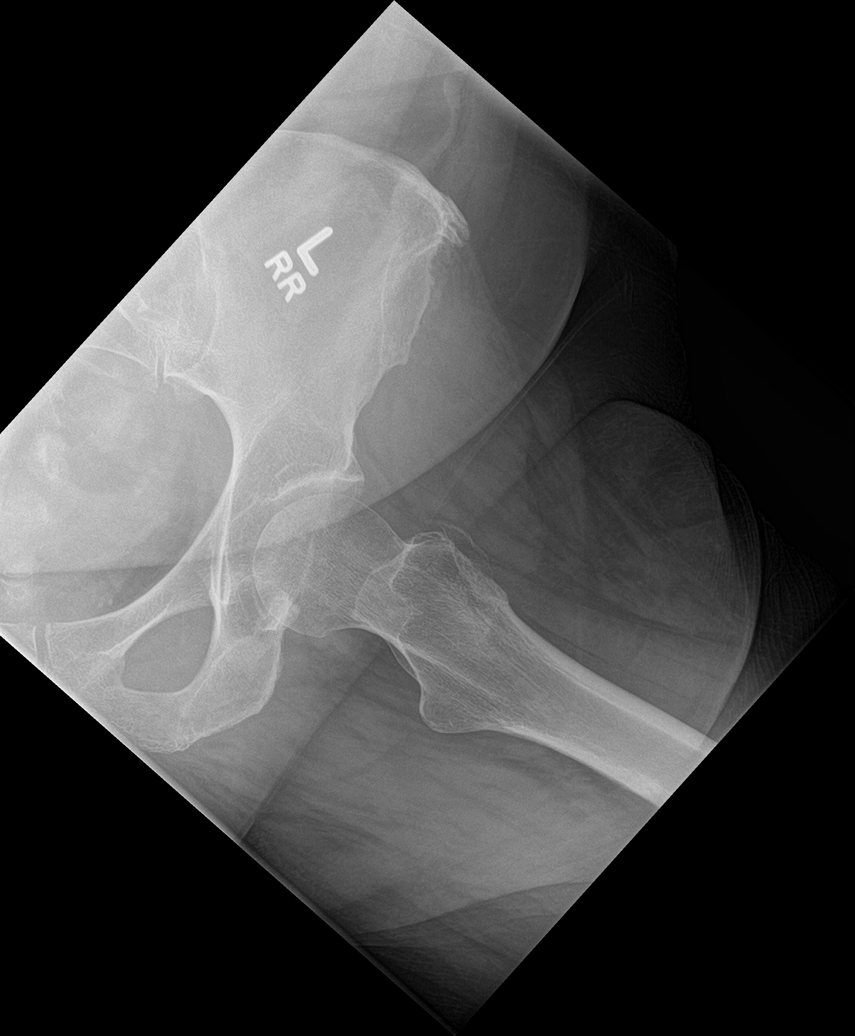

[5 of 5 positions shown; findings below may reference images not displayed]

FINDINGS: There is no evidence of hip fracture or dislocation. There is no
evidence of significant arthropathy or other focal bone abnormality.
Mild lower lumbar spondylosis.
IMPRESSION: No acute osseous abnormality or evidence of significant arthropathy.

## 2022-11-06 ENCOUNTER — Ambulatory Visit (INDEPENDENT_AMBULATORY_CARE_PROVIDER_SITE_OTHER): Payer: Medicare Other | Admitting: Family Medicine

## 2022-11-06 ENCOUNTER — Encounter (INDEPENDENT_AMBULATORY_CARE_PROVIDER_SITE_OTHER): Payer: Self-pay | Admitting: Family Medicine

## 2022-11-06 VITALS — BP 140/76 | HR 76 | Temp 97.8°F | Ht 69.0 in | Wt 180.0 lb

## 2022-11-06 DIAGNOSIS — E785 Hyperlipidemia, unspecified: Secondary | ICD-10-CM

## 2022-11-06 DIAGNOSIS — Z6826 Body mass index (BMI) 26.0-26.9, adult: Secondary | ICD-10-CM

## 2022-11-06 DIAGNOSIS — Z7985 Long-term (current) use of injectable non-insulin antidiabetic drugs: Secondary | ICD-10-CM

## 2022-11-06 DIAGNOSIS — E1169 Type 2 diabetes mellitus with other specified complication: Secondary | ICD-10-CM

## 2022-11-06 DIAGNOSIS — E538 Deficiency of other specified B group vitamins: Secondary | ICD-10-CM

## 2022-11-06 DIAGNOSIS — Z6833 Body mass index (BMI) 33.0-33.9, adult: Secondary | ICD-10-CM

## 2022-11-06 DIAGNOSIS — Z7984 Long term (current) use of oral hypoglycemic drugs: Secondary | ICD-10-CM

## 2022-11-06 DIAGNOSIS — E559 Vitamin D deficiency, unspecified: Secondary | ICD-10-CM

## 2022-11-06 DIAGNOSIS — E669 Obesity, unspecified: Secondary | ICD-10-CM

## 2022-11-06 MED ORDER — VITAMIN D (ERGOCALCIFEROL) 1.25 MG (50000 UNIT) PO CAPS: ORAL_CAPSULE | ORAL | 0 refills | Status: DC

## 2022-11-06 NOTE — Progress Notes (Signed)
Vickie Nichols, D.O.  ABFM, ABOM Specializing in Clinical Bariatric Medicine  Office located at: [address] [place], [place]  [address]     Assessment and Plan:   Medications Discontinued During This Encounter  Medication Reason   Vitamin D, Ergocalciferol, (DRISDOL) 1.25 MG (50000 UNIT) CAPS capsule Reorder    Meds ordered this encounter  Medications   Vitamin D, Ergocalciferol, (DRISDOL) 1.25 MG (50000 UNIT) CAPS capsule    Sig: 1 po q 10 days    Dispense:  8 capsule    Refill:  0    60 d supply;  ** OV for RF **   Do not send RF request    Vitamin D deficiency Assessment: Condition is Controlled.. Pt vitamin D level continues to increase as this has increased from 67.4 to 87.7. She takes Ergocalciferol 50K IU once every 7 days and denies any side effects.  Lab Results  Component Value Date   VD25OH 87.7 10/05/2022   VD25OH 67.4 05/30/2022   VD25OH 38.2 01/18/2022   Plan: - Decrease Ergocalciferol 50K IU to every 10 days. I will refill today.   - I discussed the importance of vitamin D to the patient's health and well-being as well as to their ability to lose weight.   - ideal vitamin D levels reviewed with patient    - weight loss will likely improve availability of vitamin D, thus encouraged Vickie Nichols to continue with meal plan and their weight loss efforts to further improve this condition.  Thus, we will need to monitor levels regularly to keep levels within normal limits and prevent over supplementation around 02/06/2023.   - pt's questions and concerns regarding this condition addressed.   Labs were reviewed with patient today and education provided on them. We discussed how the foods patient eats may influence these laboratory findings.  All of the patient's questions about them were answered    Vitamin B12 deficiency Assessment: Condition is At goal.. Pt B-12 level has increased from 654 to 963. This is being treated with OTC cyanocobalamin qd.  Lab  Results  Component Value Date   VITAMINB12 963 10/05/2022   Plan:- Continue with OTC  cyanocobalamin qd.  - Continue to eat foods rich in B-12.   Labs were reviewed with patient today and education provided on them. We discussed how the foods patient eats may influence these laboratory findings.  All of the patient's questions about them were answered    Type 2 diabetes mellitus with morbid obesity (HCC) Assessment: Condition is Not at goal.. Pt endorses being more consistent with checking her blood sugar at home especially when she feels low blood sugar symptoms such as light-headiness. This is being treated with Metformin which she has improved on taking Ozempic, and Wellbutrin. She has been tolerating these well and denies any GI up set, N/V/D, or other side effects. She endorses her hunger and cravings being well controlled and her A1c has improved from 5.9 to 5.4. Her CMP is within normal range. In the past her liver enzymes were elevated due to fatty liver and these levels have improved back to normal.  Lab Results  Component Value Date   HGBA1C 5.4 10/05/2022   HGBA1C 5.9 (H) 05/30/2022   HGBA1C 6.4 (H) 01/18/2022   INSULIN 16.6 01/18/2022   INSULIN 17.1 07/27/2021   INSULIN 24.5 02/21/2021    Lab Results  Component Value Date   CREATININE 0.96 10/05/2022   BUN 10 10/05/2022   NA 142  10/05/2022   K 4.6 10/05/2022   CL 102 10/05/2022   CO2 26 10/05/2022      Component Value Date/Time   PROT 6.8 10/05/2022 0943   ALBUMIN 4.5 10/05/2022 0943   AST 20 10/05/2022 0943   ALT 13 10/05/2022 0943   ALKPHOS 82 10/05/2022 0943   BILITOT 0.3 10/05/2022 0943   BILIDIR 0.1 04/02/2014 0852    Plan: - I informed pt that she does not have to take the Metformin every day and can be prn when she has a sweets cravings. Discuss with PCP if she should continue with Metformin and my prefernece would be to discontinue Metformin and continue Ozempic to help with weight loss and benefits  to the heart and kidneys.   - Continue with Metformin, Ozempic, and Wellbutrin as directed. She denies need for refill.   - Importance of f/up with PCP and all other specialists, as scheduled, was stressed to the patient today   Labs were reviewed with patient today and education provided on them. We discussed how the foods patient eats may influence these laboratory findings.  All of the patient's questions about them were answered    Hyperlipidemia associated with type 2 diabetes mellitus (HCC) Assessment: Condition is Controlled.. Pt LDL and triglyceride levels are within optimal range as of 10/05/2022. This is being treated with Crestor once daily and she denies any adverse side effects on this medication t this time.  Lab Results  Component Value Date   CHOL 104 10/05/2022   HDL 50 10/05/2022   LDLCALC 36 10/05/2022   LDLDIRECT 37 10/05/2022   TRIG 97 10/05/2022   CHOLHDL 2.1 10/05/2022   Plan:  - Continue Crestor 2.5mg  once a day at bedtime.   - Discuss with prescribing provider to see if Crestor should be 2.5mg  every other day or to taper off of this.   Vickie Nichols agrees to continue with meds and/or our treatment plan of a heart-heathy, low cholesterol meal plan  - We extensively discussed several lifestyle modifications today and she will continue to work on diet, exercise and weight loss efforts.    Labs were reviewed with patient today and education provided on them. We discussed how the foods patient eats may influence these laboratory findings.  All of the patient's questions about them were answered    TREATMENT PLAN FOR OBESITY: BMI 27.0-27.9,adult- current BMI 26.72 OBESITY-WITH CURRENT BMI  28.49  (START BMI 33.30/DATE 10/28/20) Assessment:  Vickie Nichols is here to discuss her progress with her obesity treatment plan along with follow-up of her obesity related diagnoses. See Medical Weight Management Flowsheet for complete bioelectrical impedance  results.  Condition is not optimized. Biometric data was collected today and reviewed with pt.   Since last office visit on 10/05/2022 patient's  Muscle mass has decreased by 0.6lb. Fat mass has increased by 0.4lb. Total body water has decreased by 0.2lb.  Counseling done on how various foods will affect these numbers and how to maximize success  Total lbs lost to date: 39 Total weight loss percentage to date: 17.81  Plan: - Vickie Nichols is currently in the action stage of change and will continue to folloe the Category 2 Plan with only 100 snack calories and keeping a food journal and adhering to recommended goals of 1200-1300 calories and 80+ protein.  Behavioral Intervention Additional resources provided today: patient declined Evidence-based interventions for health behavior change were utilized today including the discussion of self monitoring techniques, problem-solving barriers and SMART  goal setting techniques.   Regarding patient's less desirable eating habits and patterns, we employed the technique of small changes.  Pt will specifically work on: resume exercise routine for next visit.     She has agreed to Think about enjoyable ways to increase daily physical activity and overcoming barriers to exercise and Increase physical activity in their day and reduce sedentary time (increase NEAT).   FOLLOW UP: Return in about 4 weeks (around 12/04/2022).  She was informed of the importance of frequent follow up visits to maximize her success with intensive lifestyle modifications for her multiple health conditions.  Subjective:   Chief complaint: Obesity Vickie Nichols is here to discuss her progress with her obesity treatment plan. She is on the  the Category 2 Plan with only 100 snack calories and keeping a food journal and adhering to recommended goals of 1200-1300 calories and 80+ protein  and states she is following her eating plan approximately 30% of the time. She states she is not  exercising.  Interval History:  Vickie Nichols is here for a follow up office visit.     Since last office visit:  She informed me that recently she has not been feeling the best as she has had a UTI and a cold. She endorses her clothes not feeling too much of a difference. She occasionally skipped dinner when she has a filling lunch. She has a vacation to [place] coming up and will be there with her "healthy and fit" son in law  which will help her stay on track.    We reviewed her meal plan and all questions were answered.   Pharmacotherapy for weight loss: She is currently taking Wellbutrin and Metformin and Ozempiic  for medical weight loss.  Denies side effects.    Review of Systems:  Pertinent positives were addressed with patient today.  Reviewed by clinician on day of visit: allergies, medications, problem list, medical history, surgical history, family history, social history, and previous encounter notes.  Weight Summary and Biometrics   Weight Lost Since Last Visit: 1lb  Weight Gained Since Last Visit: 0lb   Vitals Temp: 97.8 F (36.6 C) BP: (!) 140/76 Pulse Rate: 76 SpO2: 97 %   Anthropometric Measurements Height: 5\' 9"  (1.753 m) Weight: 180 lb (81.6 kg) (pt refused weight) BMI (Calculated): 26.57 Weight at Last Visit: 181lb Weight Lost Since Last Visit: 1lb Weight Gained Since Last Visit: 0lb Starting Weight: 219lb Total Weight Loss (lbs): 39 lb (17.7 kg) Peak Weight: 230lb   Body Composition  Body Fat %: 38.6 % Fat Mass (lbs): 69.8 lbs Muscle Mass (lbs): 105.4 lbs Total Body Water (lbs): 74 lbs Visceral Fat Rating : 10   Other Clinical Data Fasting: no Labs: no Today's Visit #: 32 Starting Date: 10/28/20     Objective:   PHYSICAL EXAM: Blood pressure (!) 140/76, pulse 76, temperature 97.8 F (36.6 C), height 5\' 9"  (1.753 m), weight 180 lb (81.6 kg), SpO2 97%. Body mass index is 26.58 kg/m.  General: Well Developed, well  nourished, and in no acute distress.  HEENT: Normocephalic, atraumatic Skin: Warm and dry, cap RF less 2 sec, good turgor Chest:  Normal excursion, shape, no gross abn Respiratory: speaking in full sentences, no conversational dyspnea NeuroM-Sk: Ambulates w/o assistance, moves * 4 Psych: A and O *3, insight good, mood-full  DIAGNOSTIC DATA REVIEWED:  BMET    Component Value Date/Time   NA 142 10/05/2022 0943   K 4.6 10/05/2022 0943  CL 102 10/05/2022 0943   CO2 26 10/05/2022 0943   GLUCOSE 85 10/05/2022 0943   GLUCOSE 132 (H) 10/01/2020 0810   BUN 10 10/05/2022 0943   CREATININE 0.96 10/05/2022 0943   CALCIUM 9.7 10/05/2022 0943   GFRNONAA 54 (L) 12/17/2017 2253   GFRAA >60 12/17/2017 2253   Lab Results  Component Value Date   HGBA1C 5.4 10/05/2022   HGBA1C 6.5 07/20/2010   Lab Results  Component Value Date   INSULIN 16.6 01/18/2022   INSULIN 19.5 10/28/2020   Lab Results  Component Value Date   TSH 1.400 10/28/2020   CBC    Component Value Date/Time   WBC 8.4 07/27/2021 0829   WBC 8.4 09/16/2020 0959   RBC 4.38 07/27/2021 0829   RBC 4.45 09/16/2020 0959   HGB 13.7 07/27/2021 0829   HGB 14.2 09/22/2011 1500   HCT 40.9 07/27/2021 0829   HCT 42.4 09/22/2011 1500   PLT 261 07/27/2021 0829   MCV 93 07/27/2021 0829   MCV 93 09/22/2011 1500   MCH 31.3 07/27/2021 0829   MCH 31.1 12/17/2017 2253   MCHC 33.5 07/27/2021 0829   MCHC 32.9 09/16/2020 0959   RDW 12.1 07/27/2021 0829   RDW 12.9 09/22/2011 1500   Iron Studies No results found for: "IRON", "TIBC", "FERRITIN", "IRONPCTSAT" Lipid Panel     Component Value Date/Time   CHOL 104 10/05/2022 0943   TRIG 97 10/05/2022 0943   HDL 50 10/05/2022 0943   CHOLHDL 2.1 10/05/2022 0943   CHOLHDL 4 06/21/2018 0926   VLDL 25.2 06/21/2018 0926   LDLCALC 36 10/05/2022 0943   LDLDIRECT 37 10/05/2022 0943   LDLDIRECT 169.3 06/28/2012 0931   Hepatic Function Panel     Component Value Date/Time   PROT 6.8  10/05/2022 0943   ALBUMIN 4.5 10/05/2022 0943   AST 20 10/05/2022 0943   ALT 13 10/05/2022 0943   ALKPHOS 82 10/05/2022 0943   BILITOT 0.3 10/05/2022 0943   BILIDIR 0.1 04/02/2014 0852      Component Value Date/Time   TSH 1.400 10/28/2020 1003   Nutritional Lab Results  Component Value Date   VD25OH 87.7 10/05/2022   VD25OH 67.4 05/30/2022   VD25OH 38.2 01/18/2022    Attestations:   This encounter took 40 total minutes of time including any pre-visit and post-visit time spent on this date of service, including taking a thorough history, reviewing any labs and/or imaging, reviewing prior notes, as well as documenting in the electronic health record on the date of service. Over 50% of that time was in direct face-to-face counseling and coordinating care for the patient today  I, Clinical biochemist, acting as a Stage manager for [employer], DO., have compiled all relevant documentation for today's office visit on behalf of Thomasene Lot, DO, while in the presence of [employer], DO.  I have reviewed the above documentation for accuracy and completeness, and I agree with the above. Vickie Nichols, D.O.  The 21st Century Cures Act was signed into law in 2016 which includes the topic of electronic health records.  This provides immediate access to information in MyChart.  This includes consultation notes, operative notes, office notes, lab results and pathology reports.  If you have any questions about what you read please let us know at your next visit so we can discuss your concerns and take corrective action if need be.  We are right here with you.

## 2022-11-10 ENCOUNTER — Encounter: Payer: Self-pay | Admitting: Gastroenterology

## 2022-11-24 ENCOUNTER — Encounter: Payer: Self-pay | Admitting: Gastroenterology

## 2022-11-27 ENCOUNTER — Ambulatory Visit (INDEPENDENT_AMBULATORY_CARE_PROVIDER_SITE_OTHER): Payer: Medicare Other | Admitting: Family Medicine

## 2022-11-27 ENCOUNTER — Encounter (INDEPENDENT_AMBULATORY_CARE_PROVIDER_SITE_OTHER): Payer: Self-pay | Admitting: Family Medicine

## 2022-11-27 DIAGNOSIS — F39 Unspecified mood [affective] disorder: Secondary | ICD-10-CM

## 2022-11-27 DIAGNOSIS — Z7985 Long-term (current) use of injectable non-insulin antidiabetic drugs: Secondary | ICD-10-CM

## 2022-11-27 DIAGNOSIS — Z6833 Body mass index (BMI) 33.0-33.9, adult: Secondary | ICD-10-CM

## 2022-11-27 DIAGNOSIS — Z7984 Long term (current) use of oral hypoglycemic drugs: Secondary | ICD-10-CM

## 2022-11-27 DIAGNOSIS — E1169 Type 2 diabetes mellitus with other specified complication: Secondary | ICD-10-CM

## 2022-11-27 DIAGNOSIS — E66811 Obesity, class 1: Secondary | ICD-10-CM

## 2022-11-27 DIAGNOSIS — E559 Vitamin D deficiency, unspecified: Secondary | ICD-10-CM

## 2022-11-27 DIAGNOSIS — Z6828 Body mass index (BMI) 28.0-28.9, adult: Secondary | ICD-10-CM

## 2022-11-27 DIAGNOSIS — Z6826 Body mass index (BMI) 26.0-26.9, adult: Secondary | ICD-10-CM

## 2022-11-27 NOTE — Progress Notes (Signed)
[name], D.O.  ABFM, ABOM Specializing in Clinical Bariatric Medicine  Office located at: [address] [place], [place]  [address]   Assessment and Plan:   FOR THE DISEASE OF OBESITY: BMI 26.0-26.9,adult- current 26.27 OBESITY-WITH CURRENT BMI  28.49  (START BMI 33.30/DATE 10/28/20) Since last office visit on 11/06/2022 patient's  Muscle mass has decreased by 1.2lb. Fat mass has decreased by 1.6lb. Total body water has decreased by 0.6lb.  Counseling done on how various foods will affect these numbers and how to maximize success  Total lbs lost to date: 41 Total weight loss percentage to date: 18.72%    Recommended Dietary Goals Vickie Nichols is currently in the action stage of change. As such, her goal is to continue weight management plan.  She has agreed to: continue current plan   Behavioral Intervention We discussed the following today: increasing lean protein intake to established goals, continue to practice mindfulness when eating, planning for success, and continue to work on maintaining a reduced calorie state, getting the recommended amount of protein, incorporating whole foods, making healthy choices, staying well hydrated and practicing mindfulness when eating.  Additional resources provided today: None  Evidence-based interventions for health behavior change were utilized today including the discussion of self monitoring techniques, problem-solving barriers and SMART goal setting techniques.   Regarding patient's less desirable eating habits and patterns, we employed the technique of small changes.   Pt will specifically work on: Journal for next visit.    Recommended Physical Activity Goals Vickie Nichols has been advised to work up to 150 minutes of moderate intensity aerobic activity a week and strengthening exercises 2-3 times per week for cardiovascular health, weight loss maintenance and preservation of muscle mass.   She has agreed to :  Think about  enjoyable ways to increase daily physical activity and overcoming barriers to exercise and Increase physical activity in their day and reduce sedentary time (increase NEAT).   Pharmacotherapy We discussed various medication options to help Vickie Nichols with her weight loss efforts and we both agreed to : continue with nutritional and behavioral strategies and continue current anti-obesity medication regimen   FOR ASSOCIATED CONDITIONS ADDRESSED TODAY: Type 2 diabetes mellitus with morbid obesity (HCC) Assessment:  Condition is Not optimized.. Pt reports tolerating Semaglutide and Metformin well. She does not a little fatigue which she believes to be from being sick and habit of going to sleep with her granddaughter. She reports her blood sugar being under control.  Lab Results  Component Value Date   HGBA1C 5.4 10/05/2022   HGBA1C 5.9 (H) 05/30/2022   HGBA1C 6.4 (H) 01/18/2022   INSULIN 16.6 01/18/2022   INSULIN 17.1 07/27/2021   INSULIN 24.5 02/21/2021    Plan: - Continue with Metformin 500mg  and Semaglutide 1.25mg  as directed. Continue her prudent nutritional plan that is low in simple carbohydrates, saturated fats and trans fats to goal of 5-10% weight loss to achieve significant health benefits.  Pt encouraged to continually advance exercise and cardiovascular fitness as tolerated throughout weight loss journey. Recommend that any concerns about medicines should be directed at the prescribing provider.    Vitamin D deficiency Assessment: Condition is Controlled.. Pt vitamin D supplement was decreased from once every 7 days to once every 10. Patient reports good compliance of this change. She continues to tolerate ERGO well.  Lab Results  Component Value Date   VD25OH 87.7 10/05/2022   VD25OH 67.4 05/30/2022   VD25OH 38.2 01/18/2022   Plan: - Continue  ERGO at current dose as directed. We will continue to monitor levels regularly to keep levels within normal limits and prevent over  supplementation and recheck vitamin D levels around 02/06/2023.    Mood disorder (HCC) Assessment:  Condition is Controlled.. Pt informed me that her "emotional eating" is more out of boredom and this has significantly improved since starting Wellbutrin. She notes needing improvement with portion control and will over eat when emotional eating but notes this only happens about once every 2 months or so. Patient reports good compliance and tolerance of taking Wellbutrin. She tolerates this without difficulty. She has been on this for the last 70yrs since her husband died and discussed with her PCP about discontinuing this. Her PCP recommended she stay on this since her mother is in her 89's and wants to be precautions.   Plan: - Continue Wellbutrin XL 300mg  once daily. Getting all her proteins and fiber in on a daily basis discussed. Importance of following up with PCP and others was stressed. Reminded patient of the importance of following their prudent nutrition plan and how food can affect mood as well to support emotional wellbeing.    Follow up:   Return in about 6 weeks (around 01/10/2023). She was informed of the importance of frequent follow up visits to maximize her success with intensive lifestyle modifications for her multiple health conditions.  Subjective:   Chief complaint: Obesity Vickie Nichols is here to discuss her progress with her obesity treatment plan. She is on the the Category 2 Plan and keeping a food journal and adhering to recommended goals of 1200-1300 calories and 80+ protein and states she is following her eating plan approximately 75% of the time. She states she is not exercising.  Interval History:  Vickie Nichols is here for a follow up office visit. Since last OV,  she has been well. She has recently got over a cold and didn't eat much due to being sick. She informed me that she only likes some parts of chicken and when making chicken salad she would prefer mayonnaise.  Pt notes needing to improve on portion control as sometimes when she eats something she will eat the whole box.    Barriers identified: Being sick  Pharmacotherapy for weight loss: She is currently taking Metformin (off label use for incretin effect and / or insulin resistance and / or diabetes prevention) with adequate clinical response  and without side effects. and Ozempic with diabetes as the primary indication with adequate clinical response  and without side effects..   Review of Systems:  Pertinent positives were addressed with patient today.  Reviewed by clinician on day of visit: allergies, medications, problem list, medical history, surgical history, family history, social history, and previous encounter notes.  Weight Summary and Biometrics   Weight Lost Since Last Visit: 2lb  Weight Gained Since Last Visit: 0lb    Vitals Temp: 97.9 F (36.6 C) BP: 108/69 Pulse Rate: 70 SpO2: 99 %   Anthropometric Measurements Height: 5\' 9"  (1.753 m) Weight: 178 lb (80.7 kg) BMI (Calculated): 26.27 Weight at Last Visit: 180lb Weight Lost Since Last Visit: 2lb Weight Gained Since Last Visit: 0lb Starting Weight: 219lb Total Weight Loss (lbs): 41 lb (18.6 kg) Peak Weight: 230lb   Body Composition  Body Fat %: 38.3 % Fat Mass (lbs): 68.2 lbs Muscle Mass (lbs): 104.2 lbs Total Body Water (lbs): 73.4 lbs Visceral Fat Rating : 10   Other Clinical Data Fasting: no Labs: no Today's Visit #:  33 Starting Date: 10/28/20    Objective:   PHYSICAL EXAM: Blood pressure 108/69, pulse 70, temperature 97.9 F (36.6 C), height 5\' 9"  (1.753 m), weight 178 lb (80.7 kg), SpO2 99%. Body mass index is 26.29 kg/m.  General: she is overweight, cooperative and in no acute distress. PSYCH: Has normal mood, affect and thought process.   HEENT: EOMI, sclerae are anicteric. Lungs: Normal breathing effort, no conversational dyspnea. Extremities: Moves * 4 Neurologic: A and O * 3, good  insight  DIAGNOSTIC DATA REVIEWED: BMET    Component Value Date/Time   NA 142 10/05/2022 0943   K 4.6 10/05/2022 0943   CL 102 10/05/2022 0943   CO2 26 10/05/2022 0943   GLUCOSE 85 10/05/2022 0943   GLUCOSE 132 (H) 10/01/2020 0810   BUN 10 10/05/2022 0943   CREATININE 0.96 10/05/2022 0943   CALCIUM 9.7 10/05/2022 0943   GFRNONAA 54 (L) 12/17/2017 2253   GFRAA >60 12/17/2017 2253   Lab Results  Component Value Date   HGBA1C 5.4 10/05/2022   HGBA1C 6.5 07/20/2010   Lab Results  Component Value Date   INSULIN 16.6 01/18/2022   INSULIN 19.5 10/28/2020   Lab Results  Component Value Date   TSH 1.400 10/28/2020   CBC    Component Value Date/Time   WBC 8.4 07/27/2021 0829   WBC 8.4 09/16/2020 0959   RBC 4.38 07/27/2021 0829   RBC 4.45 09/16/2020 0959   HGB 13.7 07/27/2021 0829   HGB 14.2 09/22/2011 1500   HCT 40.9 07/27/2021 0829   HCT 42.4 09/22/2011 1500   PLT 261 07/27/2021 0829   MCV 93 07/27/2021 0829   MCV 93 09/22/2011 1500   MCH 31.3 07/27/2021 0829   MCH 31.1 12/17/2017 2253   MCHC 33.5 07/27/2021 0829   MCHC 32.9 09/16/2020 0959   RDW 12.1 07/27/2021 0829   RDW 12.9 09/22/2011 1500   Iron Studies No results found for: "IRON", "TIBC", "FERRITIN", "IRONPCTSAT" Lipid Panel     Component Value Date/Time   CHOL 104 10/05/2022 0943   TRIG 97 10/05/2022 0943   HDL 50 10/05/2022 0943   CHOLHDL 2.1 10/05/2022 0943   CHOLHDL 4 06/21/2018 0926   VLDL 25.2 06/21/2018 0926   LDLCALC 36 10/05/2022 0943   LDLDIRECT 37 10/05/2022 0943   LDLDIRECT 169.3 06/28/2012 0931   Hepatic Function Panel     Component Value Date/Time   PROT 6.8 10/05/2022 0943   ALBUMIN 4.5 10/05/2022 0943   AST 20 10/05/2022 0943   ALT 13 10/05/2022 0943   ALKPHOS 82 10/05/2022 0943   BILITOT 0.3 10/05/2022 0943   BILIDIR 0.1 04/02/2014 0852      Component Value Date/Time   TSH 1.400 10/28/2020 1003   Nutritional Lab Results  Component Value Date   VD25OH 87.7 10/05/2022    VD25OH 67.4 05/30/2022   VD25OH 38.2 01/18/2022    Attestations:   I, Clinical biochemist, acting as a Stage manager for [employer], DO., have compiled all relevant documentation for today's office visit on behalf of Thomasene Lot, DO, while in the presence of [employer], DO.  Reviewed by clinician on day of visit: allergies, medications, problem list, medical history, surgical history, family history, social history, and previous encounter notes pertinent to patient's obesity diagnosis. preparing to see patient (e.g. review and interpretation of tests, old notes ), obtaining and/or reviewing separately obtained history, performing a medically appropriate examination or evaluation, counseling and educating the patient, ordering medications, test or procedures, documenting  clinical information in the electronic or other health care record, and independently interpreting results and communicating results to the patient, family, or caregiver   I have reviewed the above documentation for accuracy and completeness, and I agree with the above. [name], D.O.  The 21st Century Cures Act was signed into law in 2016 which includes the topic of electronic health records.  This provides immediate access to information in MyChart.  This includes consultation notes, operative notes, office notes, lab results and pathology reports.  If you have any questions about what you read please let us know at your next visit so we can discuss your concerns and take corrective action if need be.  We are right here with you.

## 2022-12-08 ENCOUNTER — Ambulatory Visit (AMBULATORY_SURGERY_CENTER): Payer: Medicare Other | Admitting: *Deleted

## 2022-12-08 VITALS — Ht 69.0 in | Wt 179.0 lb

## 2022-12-08 DIAGNOSIS — Z860100 Personal history of colon polyps, unspecified: Secondary | ICD-10-CM

## 2022-12-08 MED ORDER — NA SULFATE-K SULFATE-MG SULF 17.5-3.13-1.6 GM/177ML PO SOLN
1.0000 | Freq: Once | ORAL | 0 refills | Status: AC
Start: 2022-12-08 — End: 2022-12-08

## 2022-12-08 MED ORDER — ONDANSETRON HCL 4 MG PO TABS
4.0000 mg | ORAL_TABLET | Freq: Three times a day (TID) | ORAL | 0 refills | Status: AC | PRN
Start: 2022-12-08 — End: ?

## 2022-12-08 NOTE — Progress Notes (Signed)
Pre visit completed over telephone. Instructions mailed and forwarded through MyChart. Patient instructed to notify us if any changes to health, new medications. Or ER visits. Patient instructed to hold Ozempic 7 days prior to colonoscopy.    No egg or soy allergy known to patient  No issues known to pt with past sedation with any surgeries or procedures Patient denies ever being told they had issues or difficulty with intubation  No FH of Malignant Hyperthermia Pt is not on diet pills Pt is not on  home 02  Pt is not on blood thinners  Pt denies issues with constipation  No A fib or A flutter Have any cardiac testing pending--NO Pt instructed to use [email or web address] or GoodRx for a price reduction on prep

## 2022-12-09 ENCOUNTER — Other Ambulatory Visit (INDEPENDENT_AMBULATORY_CARE_PROVIDER_SITE_OTHER): Payer: Self-pay | Admitting: Family Medicine

## 2022-12-12 ENCOUNTER — Other Ambulatory Visit: Payer: Self-pay | Admitting: Family Medicine

## 2022-12-12 DIAGNOSIS — Z1231 Encounter for screening mammogram for malignant neoplasm of breast: Secondary | ICD-10-CM

## 2022-12-18 ENCOUNTER — Ambulatory Visit (INDEPENDENT_AMBULATORY_CARE_PROVIDER_SITE_OTHER): Payer: Medicare Other | Admitting: Family Medicine

## 2022-12-19 ENCOUNTER — Ambulatory Visit: Payer: Medicare Other | Admitting: Gastroenterology

## 2022-12-19 ENCOUNTER — Encounter: Payer: Self-pay | Admitting: Gastroenterology

## 2022-12-19 VITALS — BP 139/71 | HR 68 | Temp 98.1°F | Resp 10 | Ht 69.0 in | Wt 180.0 lb

## 2022-12-19 DIAGNOSIS — D175 Benign lipomatous neoplasm of intra-abdominal organs: Secondary | ICD-10-CM | POA: Diagnosis not present

## 2022-12-19 DIAGNOSIS — K641 Second degree hemorrhoids: Secondary | ICD-10-CM

## 2022-12-19 DIAGNOSIS — D122 Benign neoplasm of ascending colon: Secondary | ICD-10-CM | POA: Diagnosis not present

## 2022-12-19 DIAGNOSIS — K644 Residual hemorrhoidal skin tags: Secondary | ICD-10-CM

## 2022-12-19 DIAGNOSIS — D123 Benign neoplasm of transverse colon: Secondary | ICD-10-CM

## 2022-12-19 DIAGNOSIS — D124 Benign neoplasm of descending colon: Secondary | ICD-10-CM | POA: Diagnosis not present

## 2022-12-19 DIAGNOSIS — Z1211 Encounter for screening for malignant neoplasm of colon: Secondary | ICD-10-CM | POA: Diagnosis not present

## 2022-12-19 DIAGNOSIS — K5730 Diverticulosis of large intestine without perforation or abscess without bleeding: Secondary | ICD-10-CM

## 2022-12-19 DIAGNOSIS — D120 Benign neoplasm of cecum: Secondary | ICD-10-CM

## 2022-12-19 DIAGNOSIS — Z860101 Personal history of adenomatous and serrated colon polyps: Secondary | ICD-10-CM

## 2022-12-19 DIAGNOSIS — Z860100 Personal history of colon polyps, unspecified: Secondary | ICD-10-CM

## 2022-12-19 MED ORDER — SODIUM CHLORIDE 0.9 % IV SOLN: 500.0000 mL | Freq: Once | INTRAVENOUS | Status: DC

## 2022-12-19 NOTE — Progress Notes (Signed)
Pt's states no medical or surgical changes since previsit or office visit. 

## 2022-12-19 NOTE — Op Note (Addendum)
[facility] Patient Name: Vickie Nichols Procedure Date: 12/19/2022 11:38 AM MRN: [id number] Endoscopist: Corliss Parish , MD, [id number] Age: 67 Referring MD:  Date of Birth: 1955/12/07 Gender: Female Account #: [id number] Procedure:                Colonoscopy Indications:              Surveillance: Personal history of adenomatous                            polyps on last colonoscopy 3 years ago Medicines:                Monitored Anesthesia Care Procedure:                Pre-Anesthesia Assessment:                           - Prior to the procedure, a History and Physical                            was performed, and patient medications and                            allergies were reviewed. The patient's tolerance of                            previous anesthesia was also reviewed. The risks                            and benefits of the procedure and the sedation                            options and risks were discussed with the patient.                            All questions were answered, and informed consent                            was obtained. Prior Anticoagulants: The patient has                            taken no anticoagulant or antiplatelet agents. ASA                            Grade Assessment: III - A patient with severe                            systemic disease. After reviewing the risks and                            benefits, the patient was deemed in satisfactory                            condition to undergo the procedure.  After obtaining informed consent, the colonoscope                            was passed under direct vision. Throughout the                            procedure, the patient's blood pressure, pulse, and                            oxygen saturations were monitored continuously. The                            [id number]-HQ190L Colonoscope [id number] was introduced                            through the  anus and advanced to the the cecum,                            identified by appendiceal orifice and ileocecal                            valve. The colonoscopy was performed without                            difficulty. The patient tolerated the procedure. Scope In: 11:57:42 AM Scope Out: 12:15:48 PM Scope Withdrawal Time: 0 hours 13 minutes 58 seconds  Total Procedure Duration: 0 hours 18 minutes 6 seconds  Findings:                 The digital rectal exam findings include                            hemorrhoids. Pertinent negatives include no                            palpable rectal lesions.                           A moderate amount of semi-liquid stool was found in                            the entire colon, interfering with visualization.                            Lavage of the area was performed using copious                            amounts, resulting in clearance with adequate                            visualization.                           The terminal ileum and ileocecal valve appeared  normal.                           There was a small lipoma, in the ascending colon.                           Five sessile polyps were found in the descending                            colon (2), transverse colon (1) and cecum (2). The                            polyps were 2 to 5 mm in size. These polyps were                            removed with a cold snare. Resection and retrieval                            were complete.                           Many medium-mouthed and small-mouthed diverticula                            were found in the recto-sigmoid colon, sigmoid                            colon and descending colon.                           Normal mucosa was found in the entire colon                            otherwise.                           Non-bleeding non-thrombosed internal hemorrhoids                            were found during  retroflexion, during perianal                            exam and during digital exam. The hemorrhoids were                            Grade II (internal hemorrhoids that prolapse but                            reduce spontaneously). Complications:            No immediate complications. Estimated Blood Loss:     Estimated blood loss was minimal. Impression:               - Hemorrhoids found on digital rectal exam.                           -  Stool in the entire examined colon - lavaged with                            adequate visualization                           - The examined portion of the ileum was normal.                           - Small ascending colon lipoma.                           - Five 2 to 5 mm polyps in the descending colon, in                            the transverse colon and in the cecum, removed with                            a cold snare. Resected and retrieved.                           - Diverticulosis in the recto-sigmoid colon, in the                            sigmoid colon and in the descending colon.                           - Normal mucosa in the entire examined colon                            otherwise.                           - Non-bleeding non-thrombosed internal hemorrhoids. Recommendation:           - The patient will be observed post-procedure,                            until all discharge criteria are met.                           - Discharge patient to home.                           - Patient has a contact number available for                            emergencies. The signs and symptoms of potential                            delayed complications were discussed with the                            patient. Return to normal activities tomorrow.  Written discharge instructions were provided to the                            patient.                           - High fiber diet.                           - Use  FiberCon 1-2 tablets PO daily.                           - Continue present medications.                           - Await pathology results.                           - Repeat colonoscopy in 3/5/7 years for                            surveillance based on pathology results (recommend                            a week of MiraLAX/Dulcolax before preparation                            versus 2-day preparation).                           - The findings and recommendations were discussed                            with the patient.                           - The findings and recommendations were discussed                            with the designated responsible adult. Corliss Parish, MD 12/19/2022 12:21:57 PM

## 2022-12-19 NOTE — Patient Instructions (Signed)
Await pathology results.  Continue present medications. High fiber diet.  Use FiberCon 1-2 tablets daily  Handouts on polyps , diverticulosis, and hemorrhoids provided.   YOU HAD AN ENDOSCOPIC PROCEDURE TODAY AT THE [facility]:   Refer to the procedure report that was given to you for any specific questions about what was found during the examination.  If the procedure report does not answer your questions, please call your gastroenterologist to clarify.  If you requested that your care partner not be given the details of your procedure findings, then the procedure report has been included in a sealed envelope for you to review at your convenience later.  YOU SHOULD EXPECT: Some feelings of bloating in the abdomen. Passage of more gas than usual.  Walking can help get rid of the air that was put into your GI tract during the procedure and reduce the bloating. If you had a lower endoscopy (such as a colonoscopy or flexible sigmoidoscopy) you may notice spotting of blood in your stool or on the toilet paper. If you underwent a bowel prep for your procedure, you may not have a normal bowel movement for a few days.  Please Note:  You might notice some irritation and congestion in your nose or some drainage.  This is from the oxygen used during your procedure.  There is no need for concern and it should clear up in a day or so.  SYMPTOMS TO REPORT IMMEDIATELY:  Following lower endoscopy (colonoscopy or flexible sigmoidoscopy):  Excessive amounts of blood in the stool  Significant tenderness or worsening of abdominal pains  Swelling of the abdomen that is new, acute  Fever of 100F or higher   For urgent or emergent issues, a gastroenterologist can be reached at any hour by calling [phone]. Do not use MyChart messaging for urgent concerns.    DIET:  We do recommend a small meal at first, but then you may proceed to your regular diet.  Drink plenty of fluids but you should  avoid alcoholic beverages for 24 hours.  ACTIVITY:  You should plan to take it easy for the rest of today and you should NOT DRIVE or use heavy machinery until tomorrow (because of the sedation medicines used during the test).    FOLLOW UP: Our staff will call the number listed on your records the next business day following your procedure.  We will call around 7:15- 8:00 am to check on you and address any questions or concerns that you may have regarding the information given to you following your procedure. If we do not reach you, we will leave a message.     If any biopsies were taken you will be contacted by phone or by letter within the next 1-3 weeks.  Please call us at [phone] if you have not heard about the biopsies in 3 weeks.    SIGNATURES/CONFIDENTIALITY: You and/or your care partner have signed paperwork which will be entered into your electronic medical record.  These signatures attest to the fact that that the information above on your After Visit Summary has been reviewed and is understood.  Full responsibility of the confidentiality of this discharge information lies with you and/or your care-partner.

## 2022-12-19 NOTE — Progress Notes (Signed)
Called to room to assist during endoscopic procedure.  Patient ID and intended procedure confirmed with present staff. Received instructions for my participation in the procedure from the performing physician.  

## 2022-12-19 NOTE — Progress Notes (Signed)
A/O x 3, gd SR's, VSS, report to RN

## 2022-12-19 NOTE — Progress Notes (Signed)
GASTROENTEROLOGY PROCEDURE H&P NOTE   Primary Care Physician: Zola Button, Grayling Congress, DO  HPI: Vickie Nichols is a 67 y.o. female who presents for Colonoscopy for surveillance of previous adenomas.  Past Medical History:  Diagnosis Date   Anxiety    Depression    Diabetes mellitus without complication (HCC)    Fatty liver    Joint pain    Leg pain    when walking   Other fatigue    Postmenopausal    Shortness of breath on exertion    Type 2 diabetes mellitus (HCC)    Past Surgical History:  Procedure Laterality Date   CESAREAN SECTION  1993   COLONOSCOPY  10/22/2019   2015   TONSILLECTOMY  1962   TUBAL LIGATION  1993   Current Outpatient Medications  Medication Sig Dispense Refill   blood glucose meter kit and supplies Dispense based on patient and insurance preference. Use up to three times daily as directed. (FOR ICD-10 E10.9, E11.9). 1 each 0   buPROPion (WELLBUTRIN XL) 300 MG 24 hr tablet Take 1 tablet (300 mg total) by mouth daily. 90 tablet 2   Cholecalciferol (VITAMIN D3) 50 MCG (2000 UT) capsule Take 1 capsule (2,000 Units total) by mouth daily.     cyanocobalamin (VITAMIN B12) 500 MCG tablet 300 mcg- 500 mcg qd     glucose blood (ACCU-CHEK GUIDE) test strip Check blood sugars once daily 100 strip 12   metFORMIN (GLUCOPHAGE-XR) 500 MG 24 hr tablet 1 po every day with food 60 tablet 0   ondansetron (ZOFRAN) 4 MG tablet Take 1 tablet (4 mg total) by mouth every 8 (eight) hours as needed for nausea or vomiting. 4 tablet 0   OneTouch UltraSoft 2 Lancets MISC USE AS DIRECTED 3 TIMES A DAY     rosuvastatin (CRESTOR) 5 MG tablet Take 0.5 tablets (2.5 mg total) by mouth at bedtime. 45 tablet 2   Semaglutide, 1 MG/DOSE, (OZEMPIC, 1 MG/DOSE,) 4 MG/3ML SOPN Inject 1 mg into the skin once a week. 9 mL 0   Vitamin D, Ergocalciferol, (DRISDOL) 1.25 MG (50000 UNIT) CAPS capsule 1 po q 10 days 8 capsule 0   No current facility-administered medications for this visit.     Current Outpatient Medications:    blood glucose meter kit and supplies, Dispense based on patient and insurance preference. Use up to three times daily as directed. (FOR ICD-10 E10.9, E11.9)., Disp: 1 each, Rfl: 0   buPROPion (WELLBUTRIN XL) 300 MG 24 hr tablet, Take 1 tablet (300 mg total) by mouth daily., Disp: 90 tablet, Rfl: 2   Cholecalciferol (VITAMIN D3) 50 MCG (2000 UT) capsule, Take 1 capsule (2,000 Units total) by mouth daily., Disp: , Rfl:    cyanocobalamin (VITAMIN B12) 500 MCG tablet, 300 mcg- 500 mcg qd, Disp: , Rfl:    glucose blood (ACCU-CHEK GUIDE) test strip, Check blood sugars once daily, Disp: 100 strip, Rfl: 12   metFORMIN (GLUCOPHAGE-XR) 500 MG 24 hr tablet, 1 po every day with food, Disp: 60 tablet, Rfl: 0   ondansetron (ZOFRAN) 4 MG tablet, Take 1 tablet (4 mg total) by mouth every 8 (eight) hours as needed for nausea or vomiting., Disp: 4 tablet, Rfl: 0   OneTouch UltraSoft 2 Lancets MISC, USE AS DIRECTED 3 TIMES A DAY, Disp: , Rfl:    rosuvastatin (CRESTOR) 5 MG tablet, Take 0.5 tablets (2.5 mg total) by mouth at bedtime., Disp: 45 tablet, Rfl: 2   Semaglutide, 1 MG/DOSE, (  OZEMPIC, 1 MG/DOSE,) 4 MG/3ML SOPN, Inject 1 mg into the skin once a week., Disp: 9 mL, Rfl: 0   Vitamin D, Ergocalciferol, (DRISDOL) 1.25 MG (50000 UNIT) CAPS capsule, 1 po q 10 days, Disp: 8 capsule, Rfl: 0 No Known Allergies Family History  Problem Relation Age of Onset   Obesity Mother    Sleep apnea Mother    Thyroid disease Mother    Kidney disease Mother    Heart disease Father    Lung disease Father        fungus-Aspergillosis   Breast cancer Maternal Aunt    Cancer Maternal Aunt        colon   Colon cancer Maternal Aunt    Esophageal cancer Maternal Aunt    Cancer Maternal Grandmother    Cancer Maternal Grandfather    Hypertension Paternal Grandfather    Diabetes Paternal Grandfather    Stroke Paternal Grandfather    Obesity Brother    Coronary artery disease Other 14        female   Colon cancer Other    Heart disease Other    Kidney disease Other    Sleep apnea Other    Obesity Other    Rectal cancer Neg Hx    Stomach cancer Neg Hx    Social History   Socioeconomic History   Marital status: Widowed    Spouse name: Not on file   Number of children: 1   Years of education: Not on file   Highest education level: Not on file  Occupational History   Occupation: Retired  Tobacco Use   Smoking status: Former    Current packs/day: 0.00    Average packs/day: 1 pack/day for 30.0 years (30.0 ttl pk-yrs)    Types: Cigarettes    Start date: 01/02/1978    Quit date: 01/03/2008    Years since quitting: 14.9   Smokeless tobacco: Never   Tobacco comments:    uses e cigarettes  Vaping Use   Vaping status: Every Day   Start date: 01/02/2014   Substances: Nicotine   Devices: Vape  Substance and Sexual Activity   Alcohol use: Never    Comment: rare   Drug use: No   Sexual activity: Not Currently  Other Topics Concern   Not on file  Social History Narrative   Not on file   Social Drivers of Health   Financial Resource Strain: Low Risk  (09/19/2021)   Overall Financial Resource Strain (CARDIA)    Difficulty of Paying Living Expenses: Not hard at all  Food Insecurity: No Food Insecurity (09/19/2021)   Hunger Vital Sign    Worried About Running Out of Food in the Last Year: Never true    Ran Out of Food in the Last Year: Never true  Transportation Needs: No Transportation Needs (09/19/2021)   PRAPARE - Administrator, Civil Service (Medical): No    Lack of Transportation (Non-Medical): No  Physical Activity: Insufficiently Active (09/19/2021)   Exercise Vital Sign    Days of Exercise per Week: 3 days    Minutes of Exercise per Session: 30 min  Stress: No Stress Concern Present (09/19/2021)   [employer] of Occupational Health - Occupational Stress Questionnaire    Feeling of Stress : Not at all  Social Connections: Socially Isolated  (09/19/2021)   Social Connection and Isolation Panel [NHANES]    Frequency of Communication with Friends and Family: More than three times a week    Frequency  of Social Gatherings with Friends and Family: More than three times a week    Attends Religious Services: Never    Database administrator or Organizations: No    Attends Banker Meetings: Never    Marital Status: Widowed  Intimate Partner Violence: Not At Risk (09/19/2021)   Humiliation, Afraid, Rape, and Kick questionnaire    Fear of Current or Ex-Partner: No    Emotionally Abused: No    Physically Abused: No    Sexually Abused: No    Physical Exam: There were no vitals filed for this visit. There is no height or weight on file to calculate BMI. GEN: NAD EYE: Sclerae anicteric ENT: MMM CV: Non-tachycardic GI: Soft, NT/ND NEURO:  Alert & Oriented x 3  Lab Results: No results for input(s): "WBC", "HGB", "HCT", "PLT" in the last 72 hours. BMET No results for input(s): "NA", "K", "CL", "CO2", "GLUCOSE", "BUN", "CREATININE", "CALCIUM" in the last 72 hours. LFT No results for input(s): "PROT", "ALBUMIN", "AST", "ALT", "ALKPHOS", "BILITOT", "BILIDIR", "IBILI" in the last 72 hours. PT/INR No results for input(s): "LABPROT", "INR" in the last 72 hours.   Impression / Plan: This is a 67 y.o.female who presents for Colonoscopy for surveillance of previous adenomas.   The risks and benefits of endoscopic evaluation/treatment were discussed with the patient and/or family; these include but are not limited to the risk of perforation, infection, bleeding, missed lesions, lack of diagnosis, severe illness requiring hospitalization, as well as anesthesia and sedation related illnesses.  The patient's history has been reviewed, patient examined, no change in status, and deemed stable for procedure.  The patient and/or family is agreeable to proceed.    Corliss Parish, MD [facility] Advanced  Endoscopy Office # [phone]

## 2022-12-20 ENCOUNTER — Telehealth: Payer: Self-pay | Admitting: *Deleted

## 2022-12-20 NOTE — Telephone Encounter (Signed)
  Follow up Call-     12/19/2022   11:26 AM  Call back number  Post procedure Call Back phone  # [phone]  Permission to leave phone message Yes     Patient questions:  Do you have a fever, pain , or abdominal swelling? No. Pain Score  0 *  Have you tolerated food without any problems? Yes.    Have you been able to return to your normal activities? Yes.    Do you have any questions about your discharge instructions: Diet   No. Medications  No. Follow up visit  No.  Do you have questions or concerns about your Care? No.  Actions: * If pain score is 4 or above: No action needed, pain <4.

## 2022-12-22 LAB — SURGICAL PATHOLOGY

## 2022-12-23 ENCOUNTER — Encounter: Payer: Self-pay | Admitting: Gastroenterology

## 2023-01-08 ENCOUNTER — Telehealth: Payer: Self-pay

## 2023-01-08 ENCOUNTER — Encounter: Payer: Self-pay | Admitting: Family Medicine

## 2023-01-08 NOTE — Telephone Encounter (Signed)
 PA initiated via Covermymeds; KEY: [id number]. Awaiting determination.

## 2023-01-09 NOTE — Telephone Encounter (Signed)
 PA approved.   OZEMPIC INJ 4MG /3ML, use as directed, is approved through 01/02/2024 under your Medicare Part D benefit. Reviewed by: R.Ph

## 2023-01-10 ENCOUNTER — Encounter (INDEPENDENT_AMBULATORY_CARE_PROVIDER_SITE_OTHER): Payer: Self-pay | Admitting: Family Medicine

## 2023-01-10 ENCOUNTER — Ambulatory Visit (INDEPENDENT_AMBULATORY_CARE_PROVIDER_SITE_OTHER): Payer: Medicare Other | Admitting: Family Medicine

## 2023-01-10 DIAGNOSIS — E559 Vitamin D deficiency, unspecified: Secondary | ICD-10-CM | POA: Diagnosis not present

## 2023-01-10 DIAGNOSIS — Z6826 Body mass index (BMI) 26.0-26.9, adult: Secondary | ICD-10-CM

## 2023-01-10 DIAGNOSIS — E669 Obesity, unspecified: Secondary | ICD-10-CM

## 2023-01-10 DIAGNOSIS — Z6833 Body mass index (BMI) 33.0-33.9, adult: Secondary | ICD-10-CM

## 2023-01-10 DIAGNOSIS — Z7984 Long term (current) use of oral hypoglycemic drugs: Secondary | ICD-10-CM

## 2023-01-10 DIAGNOSIS — E1169 Type 2 diabetes mellitus with other specified complication: Secondary | ICD-10-CM | POA: Diagnosis not present

## 2023-01-10 MED ORDER — METFORMIN HCL ER 500 MG PO TB24: ORAL_TABLET | ORAL | 0 refills | Status: DC

## 2023-01-10 MED ORDER — VITAMIN D (ERGOCALCIFEROL) 1.25 MG (50000 UNIT) PO CAPS: ORAL_CAPSULE | ORAL | 0 refills | Status: DC

## 2023-01-10 NOTE — Progress Notes (Signed)
 Vickie Nichols, D.O.  ABFM, ABOM Specializing in Clinical Bariatric Medicine  Office located at: [address] [place], [place]  [address]   Assessment and Plan:   FOR THE DISEASE OF OBESITY: OBESITY-WITH CURRENT BMI  28.49  (START BMI 33.30/DATE 10/28/20) BMI 26.0-26.9,adult- current 26.86 Assessment & Plan: Since last office visit on 11/27/22 patient's muscle mass has increased by 1.4lb. Fat mass has increased by 2.8lb. No change to total body water weight. Counseling done on how various foods will affect these numbers and how to maximize success  Total lbs lost to date: 37 lbs Total weight loss percentage to date: -16.89%    Recommended Dietary Goals Vickie Nichols is currently in the action stage of change. As such, her goal is to continue weight management plan.  She has agreed to: continue current plan   Behavioral Intervention We discussed the following today: increasing lean protein intake to established goals, work on meal planning and preparation, work on managing stress, creating time for self-care and relaxation, and continue to work on maintaining a reduced calorie state, getting the recommended amount of protein, incorporating whole foods, making healthy choices, staying well hydrated and practicing mindfulness when eating.  Additional resources provided today: None  Evidence-based interventions for health behavior change were utilized today including the discussion of self monitoring techniques, problem-solving barriers and SMART goal setting techniques.   Regarding patient's less desirable eating habits and patterns, we employed the technique of small changes.   Pt will specifically work on: Get back in the gym walking 3 miles a day and follow meal plan consistently for next visit.    Recommended Physical Activity Goals [name] has been advised to work up to 150 minutes of moderate intensity aerobic activity a week and strengthening exercises 2-3 times per  week for cardiovascular health, weight loss maintenance and preservation of muscle mass.   She has agreed to :  Increase physical activity in their day and reduce sedentary time (increase NEAT)., Start strengthening exercises with a goal of 2-3 sessions a week , and Increase the intensity, frequency or duration of aerobic exercises     Pharmacotherapy We discussed various medication options to help Rollene with her weight loss efforts and we both agreed to : continue with nutritional and behavioral strategies and continue current anti-obesity medication regimen   FOR ASSOCIATED CONDITIONS ADDRESSED TODAY: Type 2 diabetes mellitus with morbid obesity (HCC) Assessment & Plan: Pt compliant with Metformin  500 mg once daily. Also taking Ozempic  1 mg once weekly, managed by her PCP. Tolerating both medications well with no reported side effects. Reports decreased effectiveness of Ozempic , stating the effects wear off sooner than it previously did.   Continue Metformin  at current dose. We discussed the possibility of increasing her ozempic  dose, I recommend she increase her ozempic  to 2 mg once weekly. Pt prefers to discuss with her PCP at upcoming follow up appointment scheduled on 01/22/23. I also recommend she obtain microalbumin/creatine ratio labs given it was last checked 02/28/22.   Orders: Refill Metformin  today. No dose changes.    Vitamin D  deficiency Assessment & Plan: Lab Results  Component Value Date   VD25OH 87.7 10/05/2022   VD25OH 67.4 05/30/2022   VD25OH 38.2 01/18/2022   Last vitamin D  Treating condition with ERGO 50K units every 10 days. Has not been taking OTC vitamin D . Has not been going to the gym as regularly due to hip pain.   Continue on current supplementation regimen as instructed. Reviewed the  importance of vitamin D  levels and strength training to improve bone density. We will continue to follow up on her condition as it relates to weight loss.   Orders: Refill  ERGO with no dose changes.    Follow up:   Return in about 4 weeks (around 02/07/2023). She was informed of the importance of frequent follow up visits to maximize her success with intensive lifestyle modifications for her multiple health conditions.  Subjective:   Chief complaint: Obesity Avianah is here to discuss her progress with her obesity treatment plan. She is on the Category 2 Plan and keeping a food journal and adhering to recommended goals of 1200-1300 calories and 80+ g of protein and states she is following her eating plan approximately 33% of the time. She states she is not exercising.  Interval History:  Vickie Nichols is here for a follow up office visit. Since last OV, she is up 4 lbs.  Endorses current stressors due to her mother's illness and  currently being hospitalized. Reports some off-plan eating due to the high stress she is under.    Barriers identified: moderate to high levels of stress.   Pharmacotherapy for weight loss: She is currently taking Metformin  (off label use for incretin effect and / or insulin  resistance and / or diabetes prevention) with adequate clinical response  and without side effects., Bupropion  (single agent, off label use) with adequate clinical response  and without side effects., and Ozempic  with diabetes as the primary indication without side effects. and decreased effectiveness . Taking Wellbutrin  to manage moods/depression.   Review of Systems:  Pertinent positives were addressed with patient today.  Reviewed by clinician on day of visit: allergies, medications, problem list, medical history, surgical history, family history, social history, and previous encounter notes.  Weight Summary and Biometrics   Weight Lost Since Last Visit: 0lb  Weight Gained Since Last Visit: 4lb   Vitals Temp: 98 F (36.7 C) BP: 139/84 Pulse Rate: 72 SpO2: 99 %   Anthropometric Measurements Height: 5' 9 (1.753 m) Weight: 182 lb (82.6  kg) BMI (Calculated): 26.86 Weight at Last Visit: 178lb Weight Lost Since Last Visit: 0lb Weight Gained Since Last Visit: 4lb Starting Weight: 219lb Total Weight Loss (lbs): 37 lb (16.8 kg) Peak Weight: 230lb   Body Composition  Body Fat %: 39 % Fat Mass (lbs): 71 lbs Muscle Mass (lbs): 105.6 lbs Total Body Water (lbs): 73.4 lbs Visceral Fat Rating : 10   Other Clinical Data Fasting: no Labs: no Today's Visit #: 34 Starting Date: 10/28/20    Objective:   PHYSICAL EXAM: Blood pressure 139/84, pulse 72, temperature 98 F (36.7 C), height 5' 9 (1.753 m), weight 182 lb (82.6 kg), SpO2 99%. Body mass index is 26.88 kg/m.  General: she is overweight, cooperative and in no acute distress. PSYCH: Has normal mood, affect and thought process.   HEENT: EOMI, sclerae are anicteric. Lungs: Normal breathing effort, no conversational dyspnea. Extremities: Moves * 4 Neurologic: A and O * 3, good insight  DIAGNOSTIC DATA REVIEWED: BMET    Component Value Date/Time   NA 142 10/05/2022 0943   K 4.6 10/05/2022 0943   CL 102 10/05/2022 0943   CO2 26 10/05/2022 0943   GLUCOSE 85 10/05/2022 0943   GLUCOSE 132 (H) 10/01/2020 0810   BUN 10 10/05/2022 0943   CREATININE 0.96 10/05/2022 0943   CALCIUM  9.7 10/05/2022 0943   GFRNONAA 54 (L) 12/17/2017 2253   GFRAA >60 12/17/2017 2253  Lab Results  Component Value Date   HGBA1C 5.4 10/05/2022   HGBA1C 6.5 07/20/2010   Lab Results  Component Value Date   INSULIN  16.6 01/18/2022   INSULIN  19.5 10/28/2020   Lab Results  Component Value Date   TSH 1.400 10/28/2020   CBC    Component Value Date/Time   WBC 8.4 07/27/2021 0829   WBC 8.4 09/16/2020 0959   RBC 4.38 07/27/2021 0829   RBC 4.45 09/16/2020 0959   HGB 13.7 07/27/2021 0829   HGB 14.2 09/22/2011 1500   HCT 40.9 07/27/2021 0829   HCT 42.4 09/22/2011 1500   PLT 261 07/27/2021 0829   MCV 93 07/27/2021 0829   MCV 93 09/22/2011 1500   MCH 31.3 07/27/2021 0829    MCH 31.1 12/17/2017 2253   MCHC 33.5 07/27/2021 0829   MCHC 32.9 09/16/2020 0959   RDW 12.1 07/27/2021 0829   RDW 12.9 09/22/2011 1500   Iron Studies No results found for: IRON, TIBC, FERRITIN, IRONPCTSAT Lipid Panel     Component Value Date/Time   CHOL 104 10/05/2022 0943   TRIG 97 10/05/2022 0943   HDL 50 10/05/2022 0943   CHOLHDL 2.1 10/05/2022 0943   CHOLHDL 4 06/21/2018 0926   VLDL 25.2 06/21/2018 0926   LDLCALC 36 10/05/2022 0943   LDLDIRECT 37 10/05/2022 0943   LDLDIRECT 169.3 06/28/2012 0931   Hepatic Function Panel     Component Value Date/Time   PROT 6.8 10/05/2022 0943   ALBUMIN 4.5 10/05/2022 0943   AST 20 10/05/2022 0943   ALT 13 10/05/2022 0943   ALKPHOS 82 10/05/2022 0943   BILITOT 0.3 10/05/2022 0943   BILIDIR 0.1 04/02/2014 0852      Component Value Date/Time   TSH 1.400 10/28/2020 1003   Nutritional Lab Results  Component Value Date   VD25OH 87.7 10/05/2022   VD25OH 67.4 05/30/2022   VD25OH 38.2 01/18/2022    Attestations:   I, Vernell Forest, acting as a stage manager for Vickie Jenkins, DO., have compiled all relevant documentation for today's office visit on behalf of Vickie Jenkins, DO, while in the presence of [employer], DO.  Reviewed by clinician on day of visit: allergies, medications, problem list, medical history, surgical history, family history, social history, and previous encounter notes pertinent to patient's obesity diagnosis.  I have reviewed the above documentation for accuracy and completeness, and I agree with the above. Vickie JINNY Nichols, D.O.  The 21st Century Cures Act was signed into law in 2016 which includes the topic of electronic health records.  This provides immediate access to information in MyChart.  This includes consultation notes, operative notes, office notes, lab results and pathology reports.  If you have any questions about what you read please let us  know at your next visit so we can discuss  your concerns and take corrective action if need be.  We are right here with you.

## 2023-01-11 ENCOUNTER — Ambulatory Visit: Payer: Medicare Other

## 2023-01-19 ENCOUNTER — Ambulatory Visit
Admission: RE | Admit: 2023-01-19 | Discharge: 2023-01-19 | Disposition: A | Discharge status: Home / Self Care | Payer: Medicare Other | Source: Ambulatory Visit | Attending: Family Medicine | Admitting: Family Medicine

## 2023-01-19 DIAGNOSIS — Z1231 Encounter for screening mammogram for malignant neoplasm of breast: Secondary | ICD-10-CM

## 2023-01-22 ENCOUNTER — Encounter: Payer: Self-pay | Admitting: Family Medicine

## 2023-01-22 ENCOUNTER — Ambulatory Visit: Payer: Medicare Other | Admitting: Family Medicine

## 2023-01-22 VITALS — BP 142/88 | HR 73 | Temp 97.7°F | Resp 16 | Ht 69.0 in | Wt 190.4 lb

## 2023-01-22 DIAGNOSIS — E1169 Type 2 diabetes mellitus with other specified complication: Secondary | ICD-10-CM | POA: Diagnosis not present

## 2023-01-22 DIAGNOSIS — R82998 Other abnormal findings in urine: Secondary | ICD-10-CM

## 2023-01-22 DIAGNOSIS — N390 Urinary tract infection, site not specified: Secondary | ICD-10-CM | POA: Diagnosis not present

## 2023-01-22 DIAGNOSIS — E66811 Obesity, class 1: Secondary | ICD-10-CM | POA: Diagnosis not present

## 2023-01-22 DIAGNOSIS — Z6833 Body mass index (BMI) 33.0-33.9, adult: Secondary | ICD-10-CM

## 2023-01-22 DIAGNOSIS — E538 Deficiency of other specified B group vitamins: Secondary | ICD-10-CM

## 2023-01-22 DIAGNOSIS — Z7984 Long term (current) use of oral hypoglycemic drugs: Secondary | ICD-10-CM

## 2023-01-22 DIAGNOSIS — E559 Vitamin D deficiency, unspecified: Secondary | ICD-10-CM | POA: Diagnosis not present

## 2023-01-22 DIAGNOSIS — E785 Hyperlipidemia, unspecified: Secondary | ICD-10-CM

## 2023-01-22 LAB — POC URINALSYSI DIPSTICK (AUTOMATED)
Bilirubin, UA: NEGATIVE
Blood, UA: NEGATIVE
Glucose, UA: NEGATIVE
Ketones, UA: NEGATIVE
Nitrite, UA: NEGATIVE
Protein, UA: NEGATIVE
Spec Grav, UA: 1.02 (ref 1.010–1.025)
Urobilinogen, UA: 0.2 U/dL
pH, UA: 6 (ref 5.0–8.0)

## 2023-01-22 MED ORDER — SEMAGLUTIDE (2 MG/DOSE) 8 MG/3ML ~~LOC~~ SOPN: 2.0000 mg | PEN_INJECTOR | SUBCUTANEOUS | 3 refills | Status: DC

## 2023-01-22 NOTE — Progress Notes (Unsigned)
Established Patient Office Visit  Subjective   Patient ID: Vickie Nichols, female    DOB: Jun 28, 1955  Age: 68 y.o. MRN: [id number]  Chief Complaint  Patient presents with   Hyperlipidemia   Follow-up         HPI Discussed the use of AI scribe software for clinical note transcription with the patient, who gave verbal consent to proceed.  History of Present Illness   The patient, who has been attending a healthy weight program for approximately two and a half years, reports a recent weight gain, which she attributes to the holiday season and a lapse in her gym attendance over the past three months. Despite this, she notes that her most recent blood work was satisfactory, with all results in the green. She has been on Ozempic for her diabetes, but she expresses a desire to increase the dosage as she feels the current dosage is not as effective as it used to be.  The patient also reports a history of urinary tract infections (UTIs) and mentions that she was advised to have a urinalysis during her next primary care visit. She recalls that her previous UTIs were associated with the presence of blood and possibly protein in her urine.  In terms of her lifestyle, the patient mentions that she was previously walking three miles a day, at least five days a week, but has not been able to maintain this routine in the past three months. She expresses a desire to return to this level of physical activity. She also mentions that she is the primary caregiver for her granddaughter and that her daughter is expecting another child.  The patient is also on medication for diabetes and has been considering whether to transfer the administration of these drugs to another doctor who she sees more frequently. She expresses a long-term commitment to managing her health and maintaining her weight, with a goal of eventually transitioning her diabetes to diet-controlled.      Patient Active Problem List    Diagnosis Date Noted   B12 deficiency 03/29/2022   Abnormal craving with emotional eating 02/04/2022   Shortness of breath on exertion 01/18/2022   Type 2 diabetes mellitus with obesity (HCC) 01/18/2022   Vitamin B12 deficiency 12/20/2021   Class 1 obesity with serious comorbidity and body mass index (BMI) of 33.0 to 33.9 in adult 12/20/2021   Low serum vitamin B12 09/05/2021   High risk medication use 05/31/2021   Vitamin D deficiency 03/10/2021   Diabetes mellitus (HCC) 10/28/2020   NAFLD (nonalcoholic fatty liver disease) 27/25/3664   Thickened endometrium 09/22/2020   Right lower quadrant abdominal pain 09/16/2020   Hip pain, bilateral 09/16/2020   Allergic conjunctivitis and rhinitis 12/11/2014   Hyperlipidemia LDL goal <70 06/28/2012   Fatigue 04/24/2011   Hot flashes, menopausal 07/20/2010   Depression with anxiety 07/20/2010   Diabetes mellitus type II, non insulin dependent (HCC) 04/12/2010   Asymptomatic postmenopausal status 10/29/2007   TOBACCO ABUSE, HX OF 08/12/2007   Past Medical History:  Diagnosis Date   Anxiety    Depression    Diabetes mellitus without complication (HCC)    Fatty liver    Joint pain    Leg pain    when walking   Other fatigue    Postmenopausal    Shortness of breath on exertion    Type 2 diabetes mellitus (HCC)    Past Surgical History:  Procedure Laterality Date   CESAREAN SECTION  1993   COLONOSCOPY  10/22/2019   2015   TONSILLECTOMY  1962   TUBAL LIGATION  1993   Social History   Tobacco Use   Smoking status: Former    Current packs/day: 0.00    Average packs/day: 1 pack/day for 30.0 years (30.0 ttl pk-yrs)    Types: Cigarettes    Start date: 01/02/1978    Quit date: 01/03/2008    Years since quitting: 15.0   Smokeless tobacco: Never   Tobacco comments:    uses e cigarettes  Vaping Use   Vaping status: Every Day   Start date: 01/02/2014   Substances: Nicotine   Devices: Vape  Substance Use Topics   Alcohol use: Never     Comment: rare   Drug use: No   Social History   Socioeconomic History   Marital status: Widowed    Spouse name: Not on file   Number of children: 1   Years of education: Not on file   Highest education level: Bachelor's degree (e.g., BA, AB, BS)  Occupational History   Occupation: Retired  Tobacco Use   Smoking status: Former    Current packs/day: 0.00    Average packs/day: 1 pack/day for 30.0 years (30.0 ttl pk-yrs)    Types: Cigarettes    Start date: 01/02/1978    Quit date: 01/03/2008    Years since quitting: 15.0   Smokeless tobacco: Never   Tobacco comments:    uses e cigarettes  Vaping Use   Vaping status: Every Day   Start date: 01/02/2014   Substances: Nicotine   Devices: Vape  Substance and Sexual Activity   Alcohol use: Never    Comment: rare   Drug use: No   Sexual activity: Not Currently  Other Topics Concern   Not on file  Social History Narrative   Not on file   Social Drivers of Health   Financial Resource Strain: Low Risk  (01/19/2023)   Overall Financial Resource Strain (CARDIA)    Difficulty of Paying Living Expenses: Not very hard  Food Insecurity: No Food Insecurity (01/19/2023)   Hunger Vital Sign    Worried About Running Out of Food in the Last Year: Never true    Ran Out of Food in the Last Year: Never true  Transportation Needs: No Transportation Needs (01/19/2023)   PRAPARE - Administrator, Civil Service (Medical): No    Lack of Transportation (Non-Medical): No  Physical Activity: Unknown (01/19/2023)   Exercise Vital Sign    Days of Exercise per Week: 0 days    Minutes of Exercise per Session: Not on file  Stress: No Stress Concern Present (01/19/2023)   [employer] of Occupational Health - Occupational Stress Questionnaire    Feeling of Stress : Not at all  Social Connections: Socially Isolated (01/19/2023)   Social Connection and Isolation Panel [NHANES]    Frequency of Communication with Friends and Family: More than  three times a week    Frequency of Social Gatherings with Friends and Family: Three times a week    Attends Religious Services: Never    Active Member of Clubs or Organizations: No    Attends Banker Meetings: Not on file    Marital Status: Widowed  Intimate Partner Violence: Not At Risk (09/19/2021)   Humiliation, Afraid, Rape, and Kick questionnaire    Fear of Current or Ex-Partner: No    Emotionally Abused: No    Physically Abused: No    Sexually Abused: No   Family Status  Relation Name Status   Mother  Alive   Father  Alive   Mat Aunt  Deceased   MGM  (Not Specified)   MGF  (Not Specified)   PGF  (Not Specified)   Brother  (Not Specified)   Other cousin Other       maternal side cousin   Neg Hx  (Not Specified)  No partnership data on file   Family History  Problem Relation Age of Onset   Obesity Mother    Sleep apnea Mother    Thyroid disease Mother    Kidney disease Mother    Heart disease Father    Lung disease Father        fungus-Aspergillosis   Breast cancer Maternal Aunt    Cancer Maternal Aunt        colon   Colon cancer Maternal Aunt    Esophageal cancer Maternal Aunt    Cancer Maternal Grandmother    Cancer Maternal Grandfather    Hypertension Paternal Grandfather    Diabetes Paternal Grandfather    Stroke Paternal Grandfather    Obesity Brother    Coronary artery disease Other 40       female   Colon cancer Other    Heart disease Other    Kidney disease Other    Sleep apnea Other    Obesity Other    Rectal cancer Neg Hx    Stomach cancer Neg Hx    No Known Allergies    Review of Systems  Constitutional:  Negative for fever and malaise/fatigue.  HENT:  Negative for congestion.   Eyes:  Negative for blurred vision.  Respiratory:  Negative for cough and shortness of breath.   Cardiovascular:  Negative for chest pain, palpitations and leg swelling.  Gastrointestinal:  Negative for abdominal pain, blood in stool, nausea and  vomiting.  Genitourinary:  Negative for dysuria and frequency.  Musculoskeletal:  Negative for back pain and falls.  Skin:  Negative for rash.  Neurological:  Negative for dizziness, loss of consciousness and headaches.  Endo/Heme/Allergies:  Negative for environmental allergies.  Psychiatric/Behavioral:  Negative for depression. The patient is not nervous/anxious.       Objective:     BP (!) 142/88 (BP Location: Left Arm, Patient Position: Sitting, Cuff Size: Normal)   Pulse 73   Temp 97.7 F (36.5 C) (Oral)   Resp 16   Ht 5\' 9"  (1.753 m)   Wt 190 lb 6.4 oz (86.4 kg)   SpO2 94%   BMI 28.12 kg/m  BP Readings from Last 3 Encounters:  01/22/23 (!) 142/88  01/10/23 139/84  12/19/22 139/71   Wt Readings from Last 3 Encounters:  01/22/23 190 lb 6.4 oz (86.4 kg)  01/10/23 182 lb (82.6 kg)  12/19/22 180 lb (81.6 kg)   SpO2 Readings from Last 3 Encounters:  01/22/23 94%  01/10/23 99%  12/19/22 100%      Physical Exam Vitals and nursing note reviewed.  Constitutional:      General: She is not in acute distress.    Appearance: Normal appearance. She is well-developed.  HENT:     Head: Normocephalic and atraumatic.  Eyes:     General: No scleral icterus.       Right eye: No discharge.        Left eye: No discharge.  Cardiovascular:     Rate and Rhythm: Normal rate and regular rhythm.     Heart sounds: No murmur heard. Pulmonary:  Effort: Pulmonary effort is normal. No respiratory distress.     Breath sounds: Normal breath sounds.  Musculoskeletal:        General: Normal range of motion.     Cervical back: Normal range of motion and neck supple.     Right lower leg: No edema.     Left lower leg: No edema.  Skin:    General: Skin is warm and dry.  Neurological:     Mental Status: She is alert and oriented to person, place, and time.  Psychiatric:        Mood and Affect: Mood normal.        Behavior: Behavior normal.        Thought Content: Thought content  normal.        Judgment: Judgment normal.      Results for orders placed or performed in visit on 01/22/23  Urine Culture   Specimen: Urine  Result Value Ref Range   MICRO NUMBER: [id number]    SPECIMEN QUALITY: Adequate    Sample Source URINE    STATUS: FINAL    Result: No Growth   POCT Urinalysis Dipstick (Automated)  Result Value Ref Range   Color, UA yellow    Clarity, UA clear    Glucose, UA Negative Negative   Bilirubin, UA Negative    Ketones, UA Negative    Spec Grav, UA 1.020 1.010 - 1.025   Blood, UA Negative    pH, UA 6.0 5.0 - 8.0   Protein, UA Negative Negative   Urobilinogen, UA 0.2 0.2 or 1.0 E.U./dL   Nitrite, UA Negative    Leukocytes, UA Trace (A) Negative    Last CBC Lab Results  Component Value Date   WBC 8.4 07/27/2021   HGB 13.7 07/27/2021   HCT 40.9 07/27/2021   MCV 93 07/27/2021   MCH 31.3 07/27/2021   RDW 12.1 07/27/2021   PLT 261 07/27/2021   Last metabolic panel Lab Results  Component Value Date   GLUCOSE 85 10/05/2022   NA 142 10/05/2022   K 4.6 10/05/2022   CL 102 10/05/2022   CO2 26 10/05/2022   BUN 10 10/05/2022   CREATININE 0.96 10/05/2022   EGFR 65 10/05/2022   CALCIUM 9.7 10/05/2022   PROT 6.8 10/05/2022   ALBUMIN 4.5 10/05/2022   LABGLOB 2.3 10/05/2022   AGRATIO 2.0 01/18/2022   BILITOT 0.3 10/05/2022   ALKPHOS 82 10/05/2022   AST 20 10/05/2022   ALT 13 10/05/2022   ANIONGAP 9 12/17/2017   Last lipids Lab Results  Component Value Date   CHOL 104 10/05/2022   HDL 50 10/05/2022   LDLCALC 36 10/05/2022   LDLDIRECT 37 10/05/2022   TRIG 97 10/05/2022   CHOLHDL 2.1 10/05/2022   Last hemoglobin A1c Lab Results  Component Value Date   HGBA1C 5.4 10/05/2022   Last thyroid functions Lab Results  Component Value Date   TSH 1.400 10/28/2020   Last vitamin D Lab Results  Component Value Date   VD25OH 87.7 10/05/2022   Last vitamin B12 and Folate Lab Results  Component Value Date   VITAMINB12 963 10/05/2022       The ASCVD Risk score (Arnett DK, et al., 2019) failed to calculate for the following reasons:   The valid total cholesterol range is 130 to 320 mg/dL    Assessment & Plan:   Problem List Items Addressed This Visit       Unprioritized   Vitamin D deficiency  Vitamin B12 deficiency   Class 1 obesity with serious comorbidity and body mass index (BMI) of 33.0 to 33.9 in adult   Relevant Medications   Semaglutide, 2 MG/DOSE, 8 MG/3ML SOPN   Hyperlipidemia LDL goal <70   Encourage heart healthy diet such as MIND or DASH diet, increase exercise, avoid trans fats, simple carbohydrates and processed foods, consider a krill or fish or flaxseed oil cap daily.        Other Visit Diagnoses       Hyperlipidemia associated with type 2 diabetes mellitus (HCC)    -  Primary   Relevant Medications   Semaglutide, 2 MG/DOSE, 8 MG/3ML SOPN     Type 2 diabetes mellitus with morbid obesity (HCC)       Relevant Medications   Semaglutide, 2 MG/DOSE, 8 MG/3ML SOPN   Other Relevant Orders   Ambulatory referral to Ophthalmology     Frequent UTI       Relevant Orders   POCT Urinalysis Dipstick (Automated) (Completed)     Urine leukocytes increased       Relevant Orders   Urine Culture (Completed)     Assessment and Plan    Type 2 Diabetes Mellitus   Diabetes is well-controlled with current A1c levels. Currently on Ozempic 1 mg, but reports diminished efficacy. Discussed increasing the dose to 2 mg for cardiovascular benefits, weight management, and potential benefits for alcohol abuse and fatty liver. She is motivated to maintain glycemic control and weight loss. Insurance preapproval for the increased dose is necessary. Request insurance preapproval for Ozempic 2 mg and discontinue Ozempic 1 mg upon approval.  Diabetic Retinopathy Screening   Has not had a recent comprehensive diabetic eye exam. Previous exams were not thorough. Emphasized the importance of regular diabetic eye exams to  monitor for complications. Refer to Dr. Dione Booze and Doylene Canning at [facility] for a comprehensive diabetic eye exam.  Recurrent Urinary Tract Infections (UTIs)   Experiences recurrent UTIs with previous urinalysis showing blood and protein. No current symptoms, but urinalysis is needed to check for ongoing issues. Discussed potential causes of hematuria, including infection, cystitis, and bladder cancer. Persistent hematuria without infection may require a urologist referral. Order urinalysis and send urine for culture if initial results are questionable. Refer to a urologist if hematuria persists without infection.  General Health Maintenance   Gained weight over the holidays and has not been exercising regularly for the past three months. Discussed the importance of resuming regular exercise and maintaining a healthy diet for weight management and overall health. Encourage resumption of regular exercise, including walking and weight training, and discuss the importance of maintaining a healthy diet and regular physical activity.  Follow-up   Schedule a follow-up appointment for next year and transfer administration of Ozempic to Dr. Val Eagle for monthly visits.        No follow-ups on file.    Donato Schultz, DO

## 2023-01-23 LAB — URINE CULTURE
MICRO NUMBER:: 15977030
Result:: NO GROWTH
SPECIMEN QUALITY:: ADEQUATE

## 2023-01-25 NOTE — Assessment & Plan Note (Signed)
Encourage heart healthy diet such as MIND or DASH diet, increase exercise, avoid trans fats, simple carbohydrates and processed foods, consider a krill or fish or flaxseed oil cap daily.  °

## 2023-02-08 ENCOUNTER — Ambulatory Visit (INDEPENDENT_AMBULATORY_CARE_PROVIDER_SITE_OTHER): Payer: Medicare Other | Admitting: Family Medicine

## 2023-03-05 ENCOUNTER — Ambulatory Visit (INDEPENDENT_AMBULATORY_CARE_PROVIDER_SITE_OTHER): Payer: Medicare Other | Admitting: Family Medicine

## 2023-03-05 ENCOUNTER — Encounter (INDEPENDENT_AMBULATORY_CARE_PROVIDER_SITE_OTHER): Payer: Self-pay | Admitting: Family Medicine

## 2023-03-05 DIAGNOSIS — E1169 Type 2 diabetes mellitus with other specified complication: Secondary | ICD-10-CM | POA: Diagnosis not present

## 2023-03-05 DIAGNOSIS — R030 Elevated blood-pressure reading, without diagnosis of hypertension: Secondary | ICD-10-CM

## 2023-03-05 DIAGNOSIS — E559 Vitamin D deficiency, unspecified: Secondary | ICD-10-CM

## 2023-03-05 DIAGNOSIS — E66811 Obesity, class 1: Secondary | ICD-10-CM

## 2023-03-05 DIAGNOSIS — Z636 Dependent relative needing care at home: Secondary | ICD-10-CM

## 2023-03-05 DIAGNOSIS — Z7984 Long term (current) use of oral hypoglycemic drugs: Secondary | ICD-10-CM

## 2023-03-05 DIAGNOSIS — Z6827 Body mass index (BMI) 27.0-27.9, adult: Secondary | ICD-10-CM

## 2023-03-05 MED ORDER — METFORMIN HCL ER 500 MG PO TB24: ORAL_TABLET | ORAL | 0 refills | Status: DC

## 2023-03-05 MED ORDER — VITAMIN D (ERGOCALCIFEROL) 1.25 MG (50000 UNIT) PO CAPS: ORAL_CAPSULE | ORAL | 0 refills | Status: DC

## 2023-03-05 NOTE — Progress Notes (Signed)
 [name], D.O.  ABFM, ABOM Specializing in Clinical Bariatric Medicine  Office located at: [address] [place], [place]  [address]   Assessment and Plan:   FOR THE DISEASE OF OBESITY: BMI 27.0-27.9,adult -- Current BMI 27.45 Class 1 obesity with serious comorbidity and body mass index (BMI) of 33.0 to 33.9 in adult, unspecified obesity type Assessment & Plan: Since last office visit on 01/10/23 patient's muscle mass has increased by 0.8 lb. Fat mass has increased by 3 lb. Total body water has increased by 0.8 lb.  Counseling done on how various foods will affect these numbers and how to maximize success  Total lbs lost to date: 33 lbs Total weight loss percentage to date: -15.07%    Recommended Dietary Goals Vickie Nichols is currently in the action stage of change. As such, her goal is to continue weight management plan.  She has agreed to: continue current plan   Behavioral Intervention We discussed the following today: increasing lean protein intake to established goals, decreasing simple carbohydrates , avoiding skipping meals, increasing water intake , keeping healthy foods at home, decreasing eating out or consumption of processed foods, and making healthy choices when eating convenient foods, and work on managing stress, creating time for self-care and relaxation  Additional resources provided today: Handout on CAT 1-2 breakfast options and Handout on CAT 1-2 lunch options  Evidence-based interventions for health behavior change were utilized today including the discussion of self monitoring techniques, problem-solving barriers and SMART goal setting techniques.   Regarding patient's less desirable eating habits and patterns, we employed the technique of small changes.   Pt will specifically work on: getting back on track with her meal plan and get back to the gym for next visit.    Recommended Physical Activity Goals Vickie Nichols has been advised to work up to 150  minutes of moderate intensity aerobic activity a week and strengthening exercises 2-3 times per week for cardiovascular health, weight loss maintenance and preservation of muscle mass.   She has agreed to :  Think about enjoyable ways to increase daily physical activity and overcoming barriers to exercise and Increase physical activity in their day and reduce sedentary time (increase NEAT).   Pharmacotherapy Pt is currently on Semaglutide 2 mg once weekly, managed by PCP  We both agreed to: continue with nutritional and behavioral strategies and adequate clinical response to current dose, continue current regimen   FOR ASSOCIATED CONDITIONS ADDRESSED TODAY: Type 2 diabetes mellitus with morbid obesity [facility]) Assessment & Plan: Lab Results  Component Value Date   HGBA1C 5.4 10/05/2022   HGBA1C 5.9 (H) 05/30/2022   HGBA1C 6.4 (H) 01/18/2022   INSULIN 16.6 01/18/2022   INSULIN 17.1 07/27/2021   INSULIN 24.5 02/21/2021   Pt is on Metformin 500 mg once daily with lunch.  Good compliance and tolerance reported. No adverse side effects reported.   Continue with current medication regimen as prescribed. Will refill Metformin, no changes made today. Reviewed how eating off plan may lead to GI issues while taking Semaglutide and Metformin. Encouraged pt to get back on track with her meal plan by prioritizing protein and decreasing simple carbs/sugars. Will continue to monitor condition.   Orders: - Refill Metformin, no dose changes   Caregiver stress- emotional eating Assessment & Plan: Pt reports acute her caregiver stress has increased recently. Pt is on Wellbutrin XL 300 mg once daily, managed by PCP.   Continue with current medication regimen as prescribed. Reviewed the importance  of stress management for overall health and weight loss. Prioritize her protein and eating all her food to improve her emotional eating. Pt advised to increase her activity and get back to her gym routine.     Vitamin D deficiency Assessment & Plan: Lab Results  Component Value Date   VD25OH 87.7 10/05/2022   VD25OH 67.4 05/30/2022   VD25OH 38.2 01/18/2022   Pt is on ERGO every 10 days with good compliance and tolerance. No adverse side effects. No acute concerns in this regard today. Continue with current supplementation regimen, no changes made today. Will continue to monitor condition as it relates to weight loss.   Orders: - Refill ERGO, no dose changes   White coat syndrome without hypertension Assessment & Plan: BP Readings from Last 3 Encounters:  03/05/23 (!) 172/72  01/22/23 (!) 142/88  01/10/23 139/84   BP is above goal today, which she attributes this to recent acute stress. No meds currently. Diet/exercise approach.   Ambulatory blood pressure monitoring encouraged; check at 3 times a day at home.  Reminded patient that if they ever feel poorly in any way, to check their blood pressure and pulse as well. Encouraged pt to follow a heart healthy diet via her meal plan. Avoid buying foods that are: processed, frozen, or prepackaged to avoid excess salt. Will continue to monitor condition as it pertains to weight loss.    Follow up:   Return in about 4 weeks (around 04/02/2023) for 1 month f/u. She was informed of the importance of frequent follow up visits to maximize her success with intensive lifestyle modifications for her multiple health conditions.  Subjective:   Chief complaint: Obesity Vickie Nichols is here to discuss her progress with her obesity treatment plan. She is on the Category 2 Plan and keeping a food journal and adhering to recommended goals of 1200-1300 calories and 80+ g of protein and states she is following her eating plan approximately 30% of the time. She states she is not exercising. Just restarted gym 60-90 4 days (just restarted).  Interval History:  Vickie Nichols is here for a follow up office visit. Since last OV on 01/10/23, she is up 4 lbs. She  endorses caregiver stress while her mother was in the hospital. Her daughter, Vickie Nichols, who has also been helpful. She reports her emotional eating has increased recently.   Pharmacotherapy for weight loss: She is currently taking Metformin with diabetes as primary indication with adequate clinical response  and without side effects., Bupropion (single agent, off label use) with adequate clinical response  and without side effects., and Ozempic with diabetes as the primary indication with adequate clinical response  and without side effects..   Review of Systems:  Pertinent positives were addressed with patient today.  Reviewed by clinician on day of visit: allergies, medications, problem list, medical history, surgical history, family history, social history, and previous encounter notes.  Weight Summary and Biometrics   Weight Lost Since Last Visit: 0  Weight Gained Since Last Visit: 4 lb    Vitals Temp: 98.3 F (36.8 C) BP: (!) 172/72 Pulse Rate: 67 SpO2: 99 %   Anthropometric Measurements Height: 5\' 9"  (1.753 m) Weight: 186 lb (84.4 kg) BMI (Calculated): 27.45 Weight at Last Visit: 182 lb Weight Lost Since Last Visit: 0 Weight Gained Since Last Visit: 4 lb Starting Weight: 219 lb Total Weight Loss (lbs): 33 lb (15 kg) Peak Weight: 230 lb   Body Composition  Body Fat %: 39.8 %  Fat Mass (lbs): 74 lbs Muscle Mass (lbs): 106.4 lbs Total Body Water (lbs): 74.2 lbs Visceral Fat Rating : 10   Other Clinical Data Fasting: No Labs: No Today's Visit #: 35 Starting Date: 10/28/20    Objective:   PHYSICAL EXAM: Blood pressure (!) 172/72, pulse 67, temperature 98.3 F (36.8 C), height 5\' 9"  (1.753 m), weight 186 lb (84.4 kg), SpO2 99%. Body mass index is 27.47 kg/m.  General: she is overweight, cooperative and in no acute distress. PSYCH: Has normal mood, affect and thought process.   HEENT: EOMI, sclerae are anicteric. Lungs: Normal breathing effort, no  conversational dyspnea. Extremities: Moves * 4 Neurologic: A and O * 3, good insight  DIAGNOSTIC DATA REVIEWED: BMET    Component Value Date/Time   NA 142 10/05/2022 0943   K 4.6 10/05/2022 0943   CL 102 10/05/2022 0943   CO2 26 10/05/2022 0943   GLUCOSE 85 10/05/2022 0943   GLUCOSE 132 (H) 10/01/2020 0810   BUN 10 10/05/2022 0943   CREATININE 0.96 10/05/2022 0943   CALCIUM 9.7 10/05/2022 0943   GFRNONAA 54 (L) 12/17/2017 2253   GFRAA >60 12/17/2017 2253   Lab Results  Component Value Date   HGBA1C 5.4 10/05/2022   HGBA1C 6.5 07/20/2010   Lab Results  Component Value Date   INSULIN 16.6 01/18/2022   INSULIN 19.5 10/28/2020   Lab Results  Component Value Date   TSH 1.400 10/28/2020   CBC    Component Value Date/Time   WBC 8.4 07/27/2021 0829   WBC 8.4 09/16/2020 0959   RBC 4.38 07/27/2021 0829   RBC 4.45 09/16/2020 0959   HGB 13.7 07/27/2021 0829   HGB 14.2 09/22/2011 1500   HCT 40.9 07/27/2021 0829   HCT 42.4 09/22/2011 1500   PLT 261 07/27/2021 0829   MCV 93 07/27/2021 0829   MCV 93 09/22/2011 1500   MCH 31.3 07/27/2021 0829   MCH 31.1 12/17/2017 2253   MCHC 33.5 07/27/2021 0829   MCHC 32.9 09/16/2020 0959   RDW 12.1 07/27/2021 0829   RDW 12.9 09/22/2011 1500   Iron Studies No results found for: "IRON", "TIBC", "FERRITIN", "IRONPCTSAT" Lipid Panel     Component Value Date/Time   CHOL 104 10/05/2022 0943   TRIG 97 10/05/2022 0943   HDL 50 10/05/2022 0943   CHOLHDL 2.1 10/05/2022 0943   CHOLHDL 4 06/21/2018 0926   VLDL 25.2 06/21/2018 0926   LDLCALC 36 10/05/2022 0943   LDLDIRECT 37 10/05/2022 0943   LDLDIRECT 169.3 06/28/2012 0931   Hepatic Function Panel     Component Value Date/Time   PROT 6.8 10/05/2022 0943   ALBUMIN 4.5 10/05/2022 0943   AST 20 10/05/2022 0943   ALT 13 10/05/2022 0943   ALKPHOS 82 10/05/2022 0943   BILITOT 0.3 10/05/2022 0943   BILIDIR 0.1 04/02/2014 0852      Component Value Date/Time   TSH 1.400 10/28/2020 1003    Nutritional Lab Results  Component Value Date   VD25OH 87.7 10/05/2022   VD25OH 67.4 05/30/2022   VD25OH 38.2 01/18/2022    Attestations:   I, Isabelle Course, acting as a Stage manager for Thomasene Lot, DO., have compiled all relevant documentation for today's office visit on behalf of Thomasene Lot, DO, while in the presence of [employer], DO.  Reviewed by clinician on day of visit: allergies, medications, problem list, medical history, surgical history, family history, social history, and previous encounter notes pertinent to patient's obesity diagnosis.  I have  reviewed the above documentation for accuracy and completeness, and I agree with the above. [name], D.O.  The 21st Century Cures Act was signed into law in 2016 which includes the topic of electronic health records.  This provides immediate access to information in MyChart.  This includes consultation notes, operative notes, office notes, lab results and pathology reports.  If you have any questions about what you read please let us know at your next visit so we can discuss your concerns and take corrective action if need be.  We are right here with you.

## 2023-04-04 ENCOUNTER — Ambulatory Visit (INDEPENDENT_AMBULATORY_CARE_PROVIDER_SITE_OTHER): Admitting: Family Medicine

## 2023-04-04 ENCOUNTER — Encounter (INDEPENDENT_AMBULATORY_CARE_PROVIDER_SITE_OTHER): Payer: Self-pay | Admitting: Family Medicine

## 2023-04-04 DIAGNOSIS — Z6827 Body mass index (BMI) 27.0-27.9, adult: Secondary | ICD-10-CM

## 2023-04-04 DIAGNOSIS — E66811 Obesity, class 1: Secondary | ICD-10-CM

## 2023-04-04 DIAGNOSIS — E538 Deficiency of other specified B group vitamins: Secondary | ICD-10-CM

## 2023-04-04 DIAGNOSIS — E1169 Type 2 diabetes mellitus with other specified complication: Secondary | ICD-10-CM

## 2023-04-04 DIAGNOSIS — E559 Vitamin D deficiency, unspecified: Secondary | ICD-10-CM | POA: Diagnosis not present

## 2023-04-04 DIAGNOSIS — Z6826 Body mass index (BMI) 26.0-26.9, adult: Secondary | ICD-10-CM

## 2023-04-04 DIAGNOSIS — E785 Hyperlipidemia, unspecified: Secondary | ICD-10-CM | POA: Diagnosis not present

## 2023-04-04 DIAGNOSIS — R030 Elevated blood-pressure reading, without diagnosis of hypertension: Secondary | ICD-10-CM

## 2023-04-04 DIAGNOSIS — Z6833 Body mass index (BMI) 33.0-33.9, adult: Secondary | ICD-10-CM

## 2023-04-04 DIAGNOSIS — Z7985 Long-term (current) use of injectable non-insulin antidiabetic drugs: Secondary | ICD-10-CM

## 2023-04-04 DIAGNOSIS — Z7984 Long term (current) use of oral hypoglycemic drugs: Secondary | ICD-10-CM

## 2023-04-04 MED ORDER — METFORMIN HCL ER 500 MG PO TB24
ORAL_TABLET | ORAL | 0 refills | Status: DC
Start: 2023-04-04 — End: 2023-05-14

## 2023-04-04 MED ORDER — VITAMIN D (ERGOCALCIFEROL) 1.25 MG (50000 UNIT) PO CAPS: ORAL_CAPSULE | ORAL | 0 refills | Status: DC

## 2023-04-04 NOTE — Progress Notes (Signed)
 [name], D.O.  ABFM, ABOM Specializing in Clinical Bariatric Medicine  Office located at: [address] [place], [place]  [address]   Assessment and Plan:   Orders Placed This Encounter  Procedures   Comprehensive metabolic panel with GFR   Hemoglobin A1c   Lipid panel   VITAMIN D 25 Hydroxy (Vit-D Deficiency, Fractures)   Vitamin B12   CBC with Differential/Platelet   Medications Discontinued During This Encounter  Medication Reason   Vitamin D, Ergocalciferol, (DRISDOL) 1.25 MG (50000 UNIT) CAPS capsule Reorder   metFORMIN (GLUCOPHAGE-XR) 500 MG 24 hr tablet Reorder    Meds ordered this encounter  Medications   Vitamin D, Ergocalciferol, (DRISDOL) 1.25 MG (50000 UNIT) CAPS capsule    Sig: 1 po q 14 days    Dispense:  6 capsule    Refill:  0    90 d supply;  ** OV for RF **   Do not send RF request   metFORMIN (GLUCOPHAGE-XR) 500 MG 24 hr tablet    Sig: 1 po every day with lunch    Dispense:  90 tablet    Refill:  0    Pt will come in 2-3 days prior to next OV to obtain fasting labs (A1c, CMP, B12, Vit D, FLP, insulin).   FOR THE DISEASE OF OBESITY:  BMI 27.0-27.9,adult -- Current BMI 26.72 Class 1 obesity with serious comorbidity and body mass index (BMI) of 33.0 to 33.9 in adult, unspecified obesity type Assessment & Plan: Since last office visit on 03/05/2023, patient's muscle mass has decreased by 1 lb. Fat mass has decreased by 3.4 lbs. Total body water has increased by 3 lbs.  Counseling done on how various foods will affect these numbers and how to maximize success  Total lbs lost to date: 38 lbs  Total weight loss percentage to date: 17.35%    Recommended Dietary Goals Tyaisha is currently in the action stage of change. As such, her goal is to continue weight management plan.  She has agreed to: continue current plan   Behavioral Intervention We discussed the following today: increasing lean protein intake to established goals and  avoiding skipping meals  Additional resources provided today: Handout on benefits of metformin for weight loss and Handout on Common Characteristics of Successful Weight Losers, Handout on Adaptive Thermogenesis  Evidence-based interventions for health behavior change were utilized today including the discussion of self monitoring techniques, problem-solving barriers and SMART goal setting techniques.   Regarding patient's less desirable eating habits and patterns, we employed the technique of small changes.   Pt will specifically work on: Go to www.heart.org for more information about conditions for next visit.    Recommended Physical Activity Goals Avila has been advised to work up to 150 minutes of moderate intensity aerobic activity a week and strengthening exercises 2-3 times per week for cardiovascular health, weight loss maintenance and preservation of muscle mass.   She has agreed to :  Increase the intensity, frequency or duration of strengthening exercises    Pharmacotherapy We both agreed to : continue with nutritional and behavioral strategies   FOR ASSOCIATED CONDITIONS ADDRESSED TODAY:  Type 2 diabetes mellitus with morbid obesity (HCC) Assessment & Plan: Sonda is taking Metformin XR 500 mg once daily and Ozempic 2 mg once weekly. She is tolerating both medications well, denies any adverse SE. Pt reports noticing an initial change in cravings with Ozempic, but now the medication dose not seem to be working as well.  She has occasional cravings for sweets. Will consider changing medication regimen in the future. Continue to increase lean protein in diet to decrease cravings. Will recheck A1c and fasting insulin prior to next OV.   Relevant Orders: -     Hemoglobin A1c -     CBC with Differential/Platelet -     metFORMIN HCl ER; 1 po every day with lunch  Dispense: 90 tablet; Refill: 0   Hyperlipidemia associated with type 2 diabetes mellitus (HCC) Assessment &  Plan: Jakirah is on Crestor 5 mg 0.5 tablet once daily. Tolerating well, denies any SE. I stressed the importance that patient continue with our prudent nutritional plan that is low in saturated and trans fats, and low in fatty carbs. Inola agrees to continue with meds and our treatment plan of a heart-heathy, low cholesterol meal plan. Encouraged pt to visit www.heart.org for more information about cholesterol. Will recheck lipid levels prior to next OV.   Relevant Orders: -     Comprehensive metabolic panel with GFR -     Lipid panel   B12 deficiency due to diet Assessment & Plan: Eulla has not taken her B12 supplement in about 4 months -- since around November 2024. She reports running out and never picking up the new prescription. Suggested pt wait to restart supplement until after labs are obtained. Will recheck B12 prior to next OV.   Relevant Orders: -     Vitamin B12   Vitamin D deficiency Assessment & Plan: Brystal is taking ERGO 50,000 units every 14 days. Pt tolerating supplement well, denies any SE. No acute concerns in this regard. Will continue to monitor levels.   Relevant Orders: -     Vitamin D (Ergocalciferol); 1 po q 14 days  Dispense: 6 capsule; Refill: 0 -     VITAMIN D 25 Hydroxy (Vit-D Deficiency, Fractures)   White coat syndrome without hypertension Assessment & Plan: BP Readings from Last 3 Encounters:  04/04/23 129/75  03/05/23 (!) 172/72  01/22/23 (!) 142/88  Keelin presented with BP at goal today for the first time in a while. She is not checking BP at home but has not noticed any hypertensive symptoms. Continue low sodium, heart healthy MP. Will monitor condition closely.   Follow up:   Return in 6 weeks (on 05/14/2023) for fasting labs: no caffeine, no exercise, and no sex. She was informed of the importance of frequent follow up visits to maximize her success with intensive lifestyle modifications for her multiple health  conditions.  Subjective:   Chief complaint: Obesity Tyshay is here to discuss her progress with her obesity treatment plan. She is on the Category 2 Plan and keeping a food journal and adhering to recommended goals of 1200-1300 calories and 80+ g of protein  and states she is following her eating plan approximately 75% of the time. She states she is doing free weights and machines 60 minutes 4 days per week.  Interval History:  KIERA HUSSEY is here for a follow up office visit. Since last OV on 03/05/2023, Analysse is down 5 lbs. She has been having occasional cravings for carbs. Akhila endorses working out more and has her daughter as an Event organiser partner for diet and exercise.   Pharmacotherapy for weight loss: She is currently taking Wellbutrin XL 300 mg daily, Metformin XR 500 mg once daily, and Ozempic 2 mg once weekly.   Review of Systems:  Pertinent positives were addressed with patient today.  Reviewed by clinician on  day of visit: allergies, medications, problem list, medical history, surgical history, family history, social history, and previous encounter notes.  Weight Summary and Biometrics   Weight Lost Since Last Visit: 5lb  Weight Gained Since Last Visit: 0    Vitals Temp: 97.9 F (36.6 C) BP: 129/75 Pulse Rate: 70 SpO2: 99 %   Anthropometric Measurements Height: 5\' 9"  (1.753 m) Weight: 181 lb (82.1 kg) BMI (Calculated): 26.72 Weight at Last Visit: 186lb Weight Lost Since Last Visit: 5lb Weight Gained Since Last Visit: 0 Starting Weight: 219lb Total Weight Loss (lbs): 38 lb (17.2 kg) Peak Weight: 230lb   Body Composition  Body Fat %: 38.9 % Fat Mass (lbs): 70.6 lbs Muscle Mass (lbs): 105.4 lbs Total Body Water (lbs): 77.2 lbs Visceral Fat Rating : 10   Other Clinical Data Fasting: no Labs: no Today's Visit #: 36 Starting Date: 10/28/20    Objective:   PHYSICAL EXAM: Blood pressure 129/75, pulse 70, temperature 97.9 F (36.6  C), height 5\' 9"  (1.753 m), weight 181 lb (82.1 kg), SpO2 99%. Body mass index is 26.73 kg/m.  General: she is overweight, cooperative and in no acute distress. PSYCH: Has normal mood, affect and thought process.   HEENT: EOMI, sclerae are anicteric. Lungs: Normal breathing effort, no conversational dyspnea. Extremities: Moves * 4 Neurologic: A and O * 3, good insight  DIAGNOSTIC DATA REVIEWED: BMET    Component Value Date/Time   NA 142 10/05/2022 0943   K 4.6 10/05/2022 0943   CL 102 10/05/2022 0943   CO2 26 10/05/2022 0943   GLUCOSE 85 10/05/2022 0943   GLUCOSE 132 (H) 10/01/2020 0810   BUN 10 10/05/2022 0943   CREATININE 0.96 10/05/2022 0943   CALCIUM 9.7 10/05/2022 0943   GFRNONAA 54 (L) 12/17/2017 2253   GFRAA >60 12/17/2017 2253   Lab Results  Component Value Date   HGBA1C 5.4 10/05/2022   HGBA1C 6.5 07/20/2010   Lab Results  Component Value Date   INSULIN 16.6 01/18/2022   INSULIN 19.5 10/28/2020   Lab Results  Component Value Date   TSH 1.400 10/28/2020   CBC    Component Value Date/Time   WBC 8.4 07/27/2021 0829   WBC 8.4 09/16/2020 0959   RBC 4.38 07/27/2021 0829   RBC 4.45 09/16/2020 0959   HGB 13.7 07/27/2021 0829   HGB 14.2 09/22/2011 1500   HCT 40.9 07/27/2021 0829   HCT 42.4 09/22/2011 1500   PLT 261 07/27/2021 0829   MCV 93 07/27/2021 0829   MCV 93 09/22/2011 1500   MCH 31.3 07/27/2021 0829   MCH 31.1 12/17/2017 2253   MCHC 33.5 07/27/2021 0829   MCHC 32.9 09/16/2020 0959   RDW 12.1 07/27/2021 0829   RDW 12.9 09/22/2011 1500   Iron Studies No results found for: "IRON", "TIBC", "FERRITIN", "IRONPCTSAT" Lipid Panel     Component Value Date/Time   CHOL 104 10/05/2022 0943   TRIG 97 10/05/2022 0943   HDL 50 10/05/2022 0943   CHOLHDL 2.1 10/05/2022 0943   CHOLHDL 4 06/21/2018 0926   VLDL 25.2 06/21/2018 0926   LDLCALC 36 10/05/2022 0943   LDLDIRECT 37 10/05/2022 0943   LDLDIRECT 169.3 06/28/2012 0931   Hepatic Function Panel      Component Value Date/Time   PROT 6.8 10/05/2022 0943   ALBUMIN 4.5 10/05/2022 0943   AST 20 10/05/2022 0943   ALT 13 10/05/2022 0943   ALKPHOS 82 10/05/2022 0943   BILITOT 0.3 10/05/2022 0943   BILIDIR 0.1  04/02/2014 0852      Component Value Date/Time   TSH 1.400 10/28/2020 1003   Nutritional Lab Results  Component Value Date   VD25OH 87.7 10/05/2022   VD25OH 67.4 05/30/2022   VD25OH 38.2 01/18/2022    Attestations:   I, Camryn Mix, acting as a Stage manager for [employer], DO., have compiled all relevant documentation for today's office visit on behalf of Thomasene Lot, DO, while in the presence of [employer], DO.  Reviewed by clinician on day of visit: allergies, medications, problem list, medical history, surgical history, family history, social history, and previous encounter notes pertinent to patient's obesity diagnosis. I have spent 45 minutes in the care of the patient today including: preparing to see patient (e.g. review and interpretation of tests, old notes ), obtaining and/or reviewing separately obtained history, performing a medically appropriate examination or evaluation, counseling and educating the patient, ordering medications, test or procedures, documenting clinical information in the electronic or other health care record, and independently interpreting results and communicating results to the patient, family, or caregiver   I have reviewed the above documentation for accuracy and completeness, and I agree with the above. [name], D.O.  The 21st Century Cures Act was signed into law in 2016 which includes the topic of electronic health records.  This provides immediate access to information in MyChart.  This includes consultation notes, operative notes, office notes, lab results and pathology reports.  If you have any questions about what you read please let us know at your next visit so we can discuss your concerns and take corrective  action if need be.  We are right here with you.

## 2023-04-07 ENCOUNTER — Encounter: Payer: Self-pay | Admitting: Family Medicine

## 2023-04-07 DIAGNOSIS — E1169 Type 2 diabetes mellitus with other specified complication: Secondary | ICD-10-CM

## 2023-04-09 MED ORDER — ROSUVASTATIN CALCIUM 5 MG PO TABS: 2.5000 mg | ORAL_TABLET | Freq: Every day | ORAL | 2 refills | Status: DC

## 2023-04-26 LAB — CBC WITH DIFFERENTIAL/PLATELET
Basophils Absolute: 0 10*3/uL (ref 0.0–0.2)
Basos: 0 %
EOS (ABSOLUTE): 0.2 10*3/uL (ref 0.0–0.4)
Eos: 3 %
Hematocrit: 37.7 % (ref 34.0–46.6)
Hemoglobin: 12.6 g/dL (ref 11.1–15.9)
Immature Grans (Abs): 0 10*3/uL (ref 0.0–0.1)
Immature Granulocytes: 0 %
Lymphocytes Absolute: 3.5 10*3/uL — ABNORMAL HIGH (ref 0.7–3.1)
Lymphs: 44 %
MCH: 30.8 pg (ref 26.6–33.0)
MCHC: 33.4 g/dL (ref 31.5–35.7)
MCV: 92 fL (ref 79–97)
Monocytes Absolute: 0.5 10*3/uL (ref 0.1–0.9)
Monocytes: 6 %
Neutrophils Absolute: 3.8 10*3/uL (ref 1.4–7.0)
Neutrophils: 47 %
Platelets: 229 10*3/uL (ref 150–450)
RBC: 4.09 x10E6/uL (ref 3.77–5.28)
RDW: 12.4 % (ref 11.7–15.4)
WBC: 8 10*3/uL (ref 3.4–10.8)

## 2023-04-26 LAB — LIPID PANEL
Chol/HDL Ratio: 2 ratio (ref 0.0–4.4)
Cholesterol, Total: 108 mg/dL (ref 100–199)
HDL: 53 mg/dL (ref >39)
LDL Chol Calc (NIH): 41 mg/dL (ref 0–99)
Triglycerides: 61 mg/dL (ref 0–149)
VLDL Cholesterol Cal: 14 mg/dL (ref 5–40)

## 2023-04-26 LAB — COMPREHENSIVE METABOLIC PANEL WITH GFR
ALT: 11 IU/L (ref 0–32)
AST: 16 IU/L (ref 0–40)
Albumin: 4.4 g/dL (ref 3.9–4.9)
Alkaline Phosphatase: 78 IU/L (ref 44–121)
BUN/Creatinine Ratio: 9 — ABNORMAL LOW (ref 12–28)
BUN: 8 mg/dL (ref 8–27)
Bilirubin Total: 0.3 mg/dL (ref 0.0–1.2)
CO2: 24 mmol/L (ref 20–29)
Calcium: 9.5 mg/dL (ref 8.7–10.3)
Chloride: 103 mmol/L (ref 96–106)
Creatinine, Ser: 0.89 mg/dL (ref 0.57–1.00)
Globulin, Total: 2.2 g/dL (ref 1.5–4.5)
Glucose: 79 mg/dL (ref 70–99)
Potassium: 4.5 mmol/L (ref 3.5–5.2)
Sodium: 142 mmol/L (ref 134–144)
Total Protein: 6.6 g/dL (ref 6.0–8.5)
eGFR: 71 mL/min/{1.73_m2} (ref >59)

## 2023-04-26 LAB — HEMOGLOBIN A1C
Est. average glucose Bld gHb Est-mCnc: 105 mg/dL
Hgb A1c MFr Bld: 5.3 % (ref 4.8–5.6)

## 2023-04-26 LAB — VITAMIN B12: Vitamin B-12: 579 pg/mL (ref 232–1245)

## 2023-04-26 LAB — VITAMIN D 25 HYDROXY (VIT D DEFICIENCY, FRACTURES): Vit D, 25-Hydroxy: 75.4 ng/mL (ref 30.0–100.0)

## 2023-05-13 ENCOUNTER — Other Ambulatory Visit: Payer: Self-pay | Admitting: Family Medicine

## 2023-05-13 DIAGNOSIS — F320 Major depressive disorder, single episode, mild: Secondary | ICD-10-CM

## 2023-05-14 ENCOUNTER — Ambulatory Visit (INDEPENDENT_AMBULATORY_CARE_PROVIDER_SITE_OTHER): Admitting: Family Medicine

## 2023-05-14 ENCOUNTER — Encounter (INDEPENDENT_AMBULATORY_CARE_PROVIDER_SITE_OTHER): Payer: Self-pay | Admitting: Family Medicine

## 2023-05-14 VITALS — BP 136/74 | HR 76 | Temp 98.1°F | Ht 69.0 in | Wt 179.0 lb

## 2023-05-14 DIAGNOSIS — E785 Hyperlipidemia, unspecified: Secondary | ICD-10-CM | POA: Diagnosis not present

## 2023-05-14 DIAGNOSIS — Z7985 Long-term (current) use of injectable non-insulin antidiabetic drugs: Secondary | ICD-10-CM

## 2023-05-14 DIAGNOSIS — Z6826 Body mass index (BMI) 26.0-26.9, adult: Secondary | ICD-10-CM

## 2023-05-14 DIAGNOSIS — E1169 Type 2 diabetes mellitus with other specified complication: Secondary | ICD-10-CM

## 2023-05-14 DIAGNOSIS — Z6827 Body mass index (BMI) 27.0-27.9, adult: Secondary | ICD-10-CM

## 2023-05-14 DIAGNOSIS — E538 Deficiency of other specified B group vitamins: Secondary | ICD-10-CM | POA: Diagnosis not present

## 2023-05-14 DIAGNOSIS — E559 Vitamin D deficiency, unspecified: Secondary | ICD-10-CM

## 2023-05-14 DIAGNOSIS — Z7984 Long term (current) use of oral hypoglycemic drugs: Secondary | ICD-10-CM

## 2023-05-14 DIAGNOSIS — E66811 Obesity, class 1: Secondary | ICD-10-CM

## 2023-05-14 MED ORDER — ROSUVASTATIN CALCIUM 5 MG PO TABS: ORAL_TABLET | ORAL | Status: DC

## 2023-05-14 MED ORDER — VITAMIN D3 50 MCG (2000 UT) PO CAPS: 2000.0000 [IU] | ORAL_CAPSULE | Freq: Every day | ORAL | Status: DC

## 2023-05-14 NOTE — Progress Notes (Signed)
 Vickie Nichols, D.O.  ABFM, ABOM Specializing in Clinical Bariatric Medicine  Office located at: [address] [place], [place]  [address]   Assessment and Plan:  No orders of the defined types were placed in this encounter.   Medications Discontinued During This Encounter  Medication Reason   metFORMIN  (GLUCOPHAGE -XR) 500 MG 24 hr tablet Discontinued by provider     No orders of the defined types were placed in this encounter.    FOR THE DISEASE OF OBESITY:  BMI 27.0-27.9,adult -- Current BMI 26.42 Class 1 obesity with serious comorbidity and body mass index (BMI) of 33.0 to 33.9 in adult, unspecified obesity type Assessment & Plan: Since last office visit on 04/04/2023 patient's muscle mass has decreased by 1.6 lb. Fat mass has decreased by 0.8 lb. Total body water has decreased by 2.2 lb.  Counseling done on how various foods will affect these numbers and how to maximize success  Total lbs lost to date: 40 lbs  Total weight loss percentage to date: 18.26%    Recommended Dietary Goals Vickie Nichols is currently in the action stage of change. As such, her goal is to continue weight management plan.  She has agreed to: continue current plan   Behavioral Intervention We discussed the following today: continue to work on maintaining a reduced calorie state, getting the recommended amount of protein, incorporating whole foods, making healthy choices, staying well hydrated and practicing mindfulness when eating.  Additional resources provided today: None  Evidence-based interventions for health behavior change were utilized today including the discussion of self monitoring techniques, problem-solving barriers and SMART goal setting techniques.   Regarding patient's less desirable eating habits and patterns, we employed the technique of small changes.   Pt will specifically work on: n/a   Recommended Physical Activity Goals Vickie Nichols has been advised to work up to 300-450  minutes of moderate intensity aerobic activity a week and strengthening exercises 2-3 times per week for cardiovascular health, weight loss maintenance and preservation of muscle mass.   She has agreed to : continue to gradually increase the amount and intensity of exercise routine   Pharmacotherapy We both agreed to : maintain w/ Semaglutide  and Wellbutrin ; discontinue Metformin    ASSOCIATED CONDITIONS ADDRESSED TODAY:  Type 2 diabetes mellitus with morbid obesity [facility] Assessment & Plan: Most recent labs:  Lab Results  Component Value Date   HGBA1C 5.3 04/25/2023   HGBA1C 5.4 10/05/2022   HGBA1C 5.9 (H) 05/30/2022   INSULIN  16.6 01/18/2022   INSULIN  17.1 07/27/2021   INSULIN  24.5 02/21/2021   Lab Results  Component Value Date   CREATININE 0.89 04/25/2023   BUN 8 04/25/2023   NA 142 04/25/2023   K 4.5 04/25/2023   CL 103 04/25/2023   CO2 24 04/25/2023      Component Value Date/Time   PROT 6.6 04/25/2023 1211   ALBUMIN 4.4 04/25/2023 1211   AST 16 04/25/2023 1211   ALT 11 04/25/2023 1211   ALKPHOS 78 04/25/2023 1211   BILITOT 0.3 04/25/2023 1211   BILIDIR 0.1 04/02/2014 0852    HgbA1c is very well controlled for age and comorbid conditions. Denies symptoms of hypoglycemia or hyperglycemia. On Metformin  XR 500 mg once daily and Semaglutide  2 mg wkly with good adherence and no side effects. Her hunger and cravings are controlled.        Reminded pt about the importance of getting yearly DM eye & foot exams and urine microalb: crt ratio tests.  Continue with  reduced calorie meal plan low on processed crabs and simple sugars. Ongoing weight loss will improve insulin  resistance and glycemic control      Kideny function and elecotrlytes and liver enzymes stable.   A1c improved   BUN/creatinine ratio was sglhithy low at 9   Will d/c Metformin  since her A1c Is very well controlled.    Hyperlipidemia associated with type 2 diabetes mellitus [facility]) Assessment &  Plan: Most recent lipid panel:  Lab Results  Component Value Date   CHOL 108 04/25/2023   HDL 53 04/25/2023   LDLCALC 41 04/25/2023   LDLDIRECT 37 10/05/2022   TRIG 61 04/25/2023   CHOLHDL 2.0 04/25/2023   Reports compliance with Rosuvastatin  2.5 mg daily. Lipid panel WNL. Pt advised to DECREASE her Rosuvastatin  to 2.5 mg every other day. Continue to maintain a diet low in saturated and trans fats. Increase exercise as able.    Vitamin D  deficiency Assessment & Plan: Most recent Vitamin D : Lab Results  Component Value Date   VD25OH 75.4 04/25/2023   VD25OH 87.7 10/05/2022   VD25OH 67.4 05/30/2022   Pt is doing well on ergocalciferol  50,000 units q 14 days. Her Vitamin D  levels are above goal of 50 to 70. Shared decision making: DISCONTINUE prescription Vitamin D  and START OTC Vitamin D  2,000 units daily. Recheck levels in 3 months.     B12 deficiency due to diet Assessment & Plan:      Follow up:   No follow-ups on file. She was informed of the importance of frequent follow up visits to maximize her success with intensive lifestyle modifications for her multiple health conditions.  Subjective:   Chief complaint: Obesity Vickie Nichols is here to discuss her progress with her obesity treatment plan. She is on the Category 2 Plan and keeping a food journal and adhering to recommended goals of 1200-1300 calories and 80+ g of protein and states she is following her eating plan approximately 0% of the time. She states she is doing free weights at [facility] 60 minutes 4 days per week.  Interval History:  Vickie Nichols is here for a follow up office visit. Since last OV on 04/04/2023, pt is down 2 lbs.   Last 06-19-2023 her mother passed away which was anctipainted 90+ knew this was coming  - she is doing okay     Pharmacotherapy that aid with weight loss: She is currently taking Metformin  XR 500 mg daily,  Semaglutide  2 mg wkly, and Wellbutrin  XL 300 mg daily.   Review of  Systems:  Pertinent positives were addressed with patient today. Reviewed by clinician on day of visit: allergies, medications, problem list, medical history, surgical history, family history, social history, and previous encounter notes.  Weight Summary and Biometrics   Weight Lost Since Last Visit: 2lb  Weight Gained Since Last Visit: 0   Vitals Temp: 98.1 F (36.7 C) BP: 136/74 Pulse Rate: 76 SpO2: 99 %   Anthropometric Measurements Height: 5\' 9"  (1.753 m) Weight: 179 lb (81.2 kg) BMI (Calculated): 26.42 Weight at Last Visit: 181lb Weight Lost Since Last Visit: 2lb Weight Gained Since Last Visit: 0 Starting Weight: 219lb Total Weight Loss (lbs): 40 lb (18.1 kg) Peak Weight: 230lb   Body Composition  Body Fat %: 38.9 % Fat Mass (lbs): 69.8 lbs Muscle Mass (lbs): 103.8 lbs Total Body Water (lbs): 75 lbs Visceral Fat Rating : 10   Other Clinical Data Fasting: no Labs: no Today's Visit #: 37 Starting Date: 10/28/20  Objective:   PHYSICAL EXAM: Blood pressure 136/74, pulse 76, temperature 98.1 F (36.7 C), height 5\' 9"  (1.753 m), weight 179 lb (81.2 kg), SpO2 99%. Body mass index is 26.43 kg/m.  General: she is overweight, cooperative and in no acute distress. PSYCH: Has normal mood, affect and thought process.   HEENT: EOMI, sclerae are anicteric. Lungs: Normal breathing effort, no conversational dyspnea. Extremities: Moves * 4 Neurologic: A and O * 3, good insight  DIAGNOSTIC DATA REVIEWED: BMET    Component Value Date/Time   NA 142 04/25/2023 1211   K 4.5 04/25/2023 1211   CL 103 04/25/2023 1211   CO2 24 04/25/2023 1211   GLUCOSE 79 04/25/2023 1211   GLUCOSE 132 (H) 10/01/2020 0810   BUN 8 04/25/2023 1211   CREATININE 0.89 04/25/2023 1211   CALCIUM  9.5 04/25/2023 1211   GFRNONAA 54 (L) 12/17/2017 2253   GFRAA >60 12/17/2017 2253   Lab Results  Component Value Date   HGBA1C 5.3 04/25/2023   HGBA1C 6.5 07/20/2010   Lab Results   Component Value Date   INSULIN  16.6 01/18/2022   INSULIN  19.5 10/28/2020   Lab Results  Component Value Date   TSH 1.400 10/28/2020   CBC    Component Value Date/Time   WBC 8.0 04/25/2023 1211   WBC 8.4 09/16/2020 0959   RBC 4.09 04/25/2023 1211   RBC 4.45 09/16/2020 0959   HGB 12.6 04/25/2023 1211   HGB 14.2 09/22/2011 1500   HCT 37.7 04/25/2023 1211   HCT 42.4 09/22/2011 1500   PLT 229 04/25/2023 1211   MCV 92 04/25/2023 1211   MCV 93 09/22/2011 1500   MCH 30.8 04/25/2023 1211   MCH 31.1 12/17/2017 2253   MCHC 33.4 04/25/2023 1211   MCHC 32.9 09/16/2020 0959   RDW 12.4 04/25/2023 1211   RDW 12.9 09/22/2011 1500   Iron Studies No results found for: "IRON", "TIBC", "FERRITIN", "IRONPCTSAT" Lipid Panel     Component Value Date/Time   CHOL 108 04/25/2023 1211   TRIG 61 04/25/2023 1211   HDL 53 04/25/2023 1211   CHOLHDL 2.0 04/25/2023 1211   CHOLHDL 4 06/21/2018 0926   VLDL 25.2 06/21/2018 0926   LDLCALC 41 04/25/2023 1211   LDLDIRECT 37 10/05/2022 0943   LDLDIRECT 169.3 06/28/2012 0931   Hepatic Function Panel     Component Value Date/Time   PROT 6.6 04/25/2023 1211   ALBUMIN 4.4 04/25/2023 1211   AST 16 04/25/2023 1211   ALT 11 04/25/2023 1211   ALKPHOS 78 04/25/2023 1211   BILITOT 0.3 04/25/2023 1211   BILIDIR 0.1 04/02/2014 0852      Component Value Date/Time   TSH 1.400 10/28/2020 1003   Nutritional Lab Results  Component Value Date   VD25OH 75.4 04/25/2023   VD25OH 87.7 10/05/2022   VD25OH 67.4 05/30/2022    Attestations:   I, Special Puri, acting as a Stage manager for [employer], DO., have compiled all relevant documentation for today's office visit on behalf of Vickie Sensor, DO, while in the presence of [employer], DO.  I have spent X minutes in the care of the patient today including: preparing to see patient (e.g. review and interpretation of tests, old notes ), obtaining and/or reviewing separately obtained history,  performing a medically appropriate examination or evaluation, counseling and educating the patient, ordering medications, test or procedures, documenting clinical information in the electronic or other health care record, and independently interpreting results and communicating results to the patient, family, or  caregiver   I have reviewed the above documentation for accuracy and completeness, and I agree with the above. Vickie Nichols, D.O.  The 21st Century Cures Act was signed into law in 2016 which includes the topic of electronic health records.  This provides immediate access to information in MyChart.  This includes consultation notes, operative notes, office notes, lab results and pathology reports.  If you have any questions about what you read please let us  know at your next visit so we can discuss your concerns and take corrective action if need be.  We are right here with you.

## 2023-06-21 ENCOUNTER — Encounter (INDEPENDENT_AMBULATORY_CARE_PROVIDER_SITE_OTHER): Payer: Self-pay | Admitting: Family Medicine

## 2023-06-21 ENCOUNTER — Ambulatory Visit (INDEPENDENT_AMBULATORY_CARE_PROVIDER_SITE_OTHER): Admitting: Family Medicine

## 2023-06-21 VITALS — BP 146/77 | HR 75 | Temp 98.8°F | Ht 69.0 in | Wt 183.0 lb

## 2023-06-21 DIAGNOSIS — E669 Obesity, unspecified: Secondary | ICD-10-CM | POA: Insufficient documentation

## 2023-06-21 DIAGNOSIS — Z6827 Body mass index (BMI) 27.0-27.9, adult: Secondary | ICD-10-CM | POA: Insufficient documentation

## 2023-06-21 DIAGNOSIS — Z7985 Long-term (current) use of injectable non-insulin antidiabetic drugs: Secondary | ICD-10-CM

## 2023-06-21 DIAGNOSIS — F4320 Adjustment disorder, unspecified: Secondary | ICD-10-CM | POA: Diagnosis not present

## 2023-06-21 DIAGNOSIS — E559 Vitamin D deficiency, unspecified: Secondary | ICD-10-CM

## 2023-06-21 DIAGNOSIS — E1169 Type 2 diabetes mellitus with other specified complication: Secondary | ICD-10-CM | POA: Diagnosis not present

## 2023-06-21 DIAGNOSIS — E785 Hyperlipidemia, unspecified: Secondary | ICD-10-CM | POA: Diagnosis not present

## 2023-06-21 DIAGNOSIS — F5089 Other specified eating disorder: Secondary | ICD-10-CM

## 2023-06-21 NOTE — Progress Notes (Signed)
 Vickie Nichols, D.O.  ABFM, ABOM Specializing in Clinical Bariatric Medicine  Office located at: [address] [place], [place]  [address]   Assessment and Plan:   FOR THE DISEASE OF OBESITY:  BMI 27.0-27.9,adult Obesity, Beginning BMI 33.30 Assessment & Plan: Since last office visit on 05/14/2023 patient's  Muscle mass has increased by 1 lb. Fat mass has increased by  3.6 lb. Total body water has increased by 4.2 lb.  Counseling done on how various foods will affect these numbers and how to maximize success  Total lbs lost to date: 36 lbs  Total weight loss percentage to date: 16.44    Recommended Dietary Goals Vickie Nichols is currently in the action stage of change. As such, her goal is to continue weight management plan.  She has agreed to: continue current plan   Behavioral Intervention We discussed the following today: work on managing stress, creating time for self-care and relaxation and focusing on food with a 10:1 ratio of calories: grams of protein  Additional resources provided today: [employer] business card.   Evidence-based interventions for health behavior change were utilized today including the discussion of self monitoring techniques, problem-solving barriers and SMART goal setting techniques.   Regarding patient's less desirable eating habits and patterns, we employed the technique of small changes.   Pt will specifically work on trying [employer] meal prep and journaling her intake.   Recommended Physical Activity Goals Vickie Nichols has been advised to work up to 300-450 minutes of moderate intensity aerobic activity a week and strengthening exercises 2-3 times per week for cardiovascular health, weight loss maintenance and preservation of muscle mass.   She has agreed to go to the gym 3 days/week.    Pharmacotherapy We both agreed to continue same regimen.    ASSOCIATED CONDITIONS ADDRESSED TODAY:  Grief reaction - emotional  eating Assessment & Plan: Her mother died a month ago. States that some days are tougher than others. Has been prioritizing her mental health and is reconnecting with people and doing more social activities. Food wise, she endorses either skipping meals or eating the wrong foods. On Wellbutrin  XL 300 mg daily and wonders if she can come off it. Blood pressure above goal; she has a known hx of white coat. Pt asx.   Encouraged to continue on Wellbutrin  for minimum of 8-12 months. Continue social activities. Recommend exercise with goal of going to the gym 3 days/week. Importance of not skipping meals and getting all her proteins and fiber in on a daily basis discussed. Reminded patient of the importance of  following their prudent nutrition plan and how food can affect mood as well to support emotional wellbeing.     Type 2 diabetes mellitus with morbid obesity [facility]) Assessment & Plan: Lab Results  Component Value Date   HGBA1C 5.3 04/25/2023   HGBA1C 5.4 10/05/2022   HGBA1C 5.9 (H) 05/30/2022   INSULIN  16.6 01/18/2022   INSULIN  17.1 07/27/2021   INSULIN  24.5 02/21/2021    HgbA1c is at goal for age and comorbid conditions. Denies symptoms of hypoglycemia or hyperglycemia. On Semaglutide  2 mg wkly  with good adherence and no side effects. I discontinued her Metformin  LOV d/t her well controlled A1c. Good control of hunger and cravings.   Continue regimen and reduced calorie meal plan low on processed crabs and simple sugars. Ongoing weight loss will improve insulin  resistance.    Hyperlipidemia associated with type 2 diabetes mellitus ([facility]) Assessment & Plan: Lab  Results  Component Value Date   CHOL 108 04/25/2023   HDL 53 04/25/2023   LDLCALC 41 04/25/2023   LDLDIRECT 37 10/05/2022   TRIG 61 04/25/2023   CHOLHDL 2.0 04/25/2023   LOV, we decreased her Rosuvastatin  to 2.5 mg every other day; she is compliant and tolerant to this dose. Continue statin and low cholesterol heart healthy  meal plan. Increase exercise as able.     Vitamin D  deficiency Assessment & Plan: Lab Results  Component Value Date   VD25OH 75.4 04/25/2023   VD25OH 87.7 10/05/2022   VD25OH 67.4 05/30/2022   She discontinued her prescription VD as instructed LOV, but did not begin OTC VD. Pt reminded to START OTC VD 2,000 units daily. Recheck 3 mos.    Follow up:   Return 07/23/2023 at 10:40 AM. She was informed of the importance of frequent follow up visits to maximize her success with intensive lifestyle modifications for her multiple health conditions.   Subjective:   Chief complaint: Obesity Vickie Nichols is here to discuss her progress with her obesity treatment plan. She is on the Category 2 Plan and keeping a food journal and adhering to recommended goals of 1200-1300 calories and 80+ grams of protein and states she is following her eating plan approximately 0% of the time. She states she is not exercising.  Interval History:  Vickie Nichols is here for a follow up office visit. Her mother died a month ago. States that some days are tougher than others. Has been prioritizing her mental health and is reconnecting with people and doing more social activities. Food wise, she endorses either skipping meals or eating the wrong foods. On Wellbutrin  XL 300 mg daily and wonders if she can come off it. Is up 4 lbs since LOV on 5/12.    Pharmacotherapy that aid with weight loss: She is currently taking Semaglutide  2 mg wkly and Wellbutrin  XL 300 mg daily.    Review of Systems:  Pertinent positives were addressed with patient today.  Reviewed by clinician on day of visit: allergies, medications, problem list, medical history, surgical history, family history, social history, and previous encounter notes.  Weight Summary and Biometrics   Weight Lost Since Last Visit: 0  Weight Gained Since Last Visit: 4 lb   Vitals Temp: 98.8 F (37.1 C) BP: (!) 146/77 Pulse Rate: 75 SpO2: 95  %   Anthropometric Measurements Height: 5' 9 (1.753 m) Weight: 183 lb (83 kg) BMI (Calculated): 27.01 Weight at Last Visit: 179 lb Weight Lost Since Last Visit: 0 Weight Gained Since Last Visit: 4 lb Starting Weight: 219 lb Total Weight Loss (lbs): 36 lb (16.3 kg) Peak Weight: 230 lb   Body Composition  Body Fat %: 39.9 % Fat Mass (lbs): 73.4 lbs Muscle Mass (lbs): 104.8 lbs Total Body Water (lbs): 79.2 lbs Visceral Fat Rating : 10   Other Clinical Data Fasting: No Labs: No Today's Visit #: 38 Starting Date: 10/28/20    Objective:   PHYSICAL EXAM: Blood pressure (!) 146/77, pulse 75, temperature 98.8 F (37.1 C), height 5' 9 (1.753 m), weight 183 lb (83 kg), SpO2 95%. Body mass index is 27.02 kg/m.  General: she is overweight, cooperative and in no acute distress. PSYCH: Has normal mood, affect and thought process.   HEENT: EOMI, sclerae are anicteric. Lungs: Normal breathing effort, no conversational dyspnea. Extremities: Moves * 4 Neurologic: A and O * 3, good insight  DIAGNOSTIC DATA REVIEWED: BMET    Component Value Date/Time  NA 142 04/25/2023 1211   K 4.5 04/25/2023 1211   CL 103 04/25/2023 1211   CO2 24 04/25/2023 1211   GLUCOSE 79 04/25/2023 1211   GLUCOSE 132 (H) 10/01/2020 0810   BUN 8 04/25/2023 1211   CREATININE 0.89 04/25/2023 1211   CALCIUM  9.5 04/25/2023 1211   GFRNONAA 54 (L) 12/17/2017 2253   GFRAA >60 12/17/2017 2253   Lab Results  Component Value Date   HGBA1C 5.3 04/25/2023   HGBA1C 6.5 07/20/2010   Lab Results  Component Value Date   INSULIN  16.6 01/18/2022   INSULIN  19.5 10/28/2020   Lab Results  Component Value Date   TSH 1.400 10/28/2020   CBC    Component Value Date/Time   WBC 8.0 04/25/2023 1211   WBC 8.4 09/16/2020 0959   RBC 4.09 04/25/2023 1211   RBC 4.45 09/16/2020 0959   HGB 12.6 04/25/2023 1211   HGB 14.2 09/22/2011 1500   HCT 37.7 04/25/2023 1211   HCT 42.4 09/22/2011 1500   PLT 229 04/25/2023  1211   MCV 92 04/25/2023 1211   MCV 93 09/22/2011 1500   MCH 30.8 04/25/2023 1211   MCH 31.1 12/17/2017 2253   MCHC 33.4 04/25/2023 1211   MCHC 32.9 09/16/2020 0959   RDW 12.4 04/25/2023 1211   RDW 12.9 09/22/2011 1500   Iron Studies No results found for: IRON, TIBC, FERRITIN, IRONPCTSAT Lipid Panel     Component Value Date/Time   CHOL 108 04/25/2023 1211   TRIG 61 04/25/2023 1211   HDL 53 04/25/2023 1211   CHOLHDL 2.0 04/25/2023 1211   CHOLHDL 4 06/21/2018 0926   VLDL 25.2 06/21/2018 0926   LDLCALC 41 04/25/2023 1211   LDLDIRECT 37 10/05/2022 0943   LDLDIRECT 169.3 06/28/2012 0931   Hepatic Function Panel     Component Value Date/Time   PROT 6.6 04/25/2023 1211   ALBUMIN 4.4 04/25/2023 1211   AST 16 04/25/2023 1211   ALT 11 04/25/2023 1211   ALKPHOS 78 04/25/2023 1211   BILITOT 0.3 04/25/2023 1211   BILIDIR 0.1 04/02/2014 0852      Component Value Date/Time   TSH 1.400 10/28/2020 1003   Nutritional Lab Results  Component Value Date   VD25OH 75.4 04/25/2023   VD25OH 87.7 10/05/2022   VD25OH 67.4 05/30/2022    Attestations:   I, Special Puri, acting as a Stage manager for [employer], DO., have compiled all relevant documentation for today's office visit on behalf of Vickie Jenkins, DO, while in the presence of [employer], DO.  I have reviewed the above documentation for accuracy and completeness, and I agree with the above. Vickie JINNY Nichols, D.O.  The 21st Century Cures Act was signed into law in 2016 which includes the topic of electronic health records.  This provides immediate access to information in MyChart.  This includes consultation notes, operative notes, office notes, lab results and pathology reports.  If you have any questions about what you read please let us  know at your next visit so we can discuss your concerns and take corrective action if need be.  We are right here with you.

## 2023-07-08 ENCOUNTER — Other Ambulatory Visit (INDEPENDENT_AMBULATORY_CARE_PROVIDER_SITE_OTHER): Payer: Self-pay | Admitting: Family Medicine

## 2023-07-08 DIAGNOSIS — E559 Vitamin D deficiency, unspecified: Secondary | ICD-10-CM

## 2023-07-14 ENCOUNTER — Other Ambulatory Visit (INDEPENDENT_AMBULATORY_CARE_PROVIDER_SITE_OTHER): Payer: Self-pay | Admitting: Family Medicine

## 2023-07-20 ENCOUNTER — Encounter: Payer: Self-pay | Admitting: Advanced Practice Midwife

## 2023-07-23 ENCOUNTER — Ambulatory Visit (INDEPENDENT_AMBULATORY_CARE_PROVIDER_SITE_OTHER): Admitting: Family Medicine

## 2023-07-23 ENCOUNTER — Encounter (INDEPENDENT_AMBULATORY_CARE_PROVIDER_SITE_OTHER): Payer: Self-pay | Admitting: Family Medicine

## 2023-07-23 DIAGNOSIS — E785 Hyperlipidemia, unspecified: Secondary | ICD-10-CM

## 2023-07-23 DIAGNOSIS — E1169 Type 2 diabetes mellitus with other specified complication: Secondary | ICD-10-CM | POA: Diagnosis not present

## 2023-07-23 DIAGNOSIS — E559 Vitamin D deficiency, unspecified: Secondary | ICD-10-CM

## 2023-07-23 DIAGNOSIS — Z7985 Long-term (current) use of injectable non-insulin antidiabetic drugs: Secondary | ICD-10-CM

## 2023-07-23 DIAGNOSIS — Z6829 Body mass index (BMI) 29.0-29.9, adult: Secondary | ICD-10-CM

## 2023-07-23 DIAGNOSIS — E669 Obesity, unspecified: Secondary | ICD-10-CM | POA: Diagnosis not present

## 2023-07-23 NOTE — Progress Notes (Signed)
 Vickie Nichols, D.O.  ABFM, ABOM Specializing in Clinical Bariatric Medicine  Office located at: [address] [place], [place]  [address]   Assessment and Plan:   FOR THE DISEASE OF OBESITY:  BMI 29.0-29.9,adult Obesity, Beginning BMI 33.30 Assessment & Plan: Since last office visit on 06/21/2023 patient's muscle mass has increased by 0.8 lbs. Fat mass has increased by 4.6 lbs. Total body water has decreased by 3.6 lbs.  Counseling done on how various foods will affect these numbers and how to maximize success  Total lbs lost to date: - 30 lbs Total weight loss percentage to date: -13.70 %   Recommended Dietary Goals Vickie Nichols is currently in the action stage of change. As such, her goal is to continue weight management plan.  She has agreed to: continue current plan   Behavioral Intervention We discussed the following today: creating time for self-care,  importance of positive self talk, increasing lean protein intake to established goals and work on meal planning and preparation  Additional resources provided today: None  Evidence-based interventions for health behavior change were utilized today including the discussion of self monitoring techniques, problem-solving barriers and SMART goal setting techniques.   Regarding patient's less desirable eating habits and patterns, we employed the technique of small changes.   Goal: work on meal prepping and planning    Recommended Physical Activity Goals Vickie Nichols has been advised to work up to 300-450 minutes of moderate intensity aerobic activity a week and strengthening exercises 2-3 times per week for cardiovascular health, weight loss maintenance and preservation of muscle mass.   She was encouraged to go to the gym 4+ days a week for her mental and physical well-being.   Pharmacotherapy Continue same regimen.    ASSOCIATED CONDITIONS ADDRESSED TODAY:   Type 2 diabetes mellitus with morbid obesity  [facility]) Assessment & Plan: Lab Results  Component Value Date   HGBA1C 5.3 04/25/2023   HGBA1C 5.4 10/05/2022   HGBA1C 5.9 (H) 05/30/2022   INSULIN  16.6 01/18/2022   INSULIN  17.1 07/27/2021   INSULIN  24.5 02/21/2021    HgbA1c is at goal for age and comorbid conditions. Pt not checking her blood sugars; denies symptoms of hypoglycemia or hyperglycemia. On Semaglutide  2 mg wkly with good adherence and no side effects. No complaints of excessive hunger and cravings.   - Pt reminded to eat on a regular basis- no skipping or going long periods without eating.     - Continue GLP-1 therapy and reduced calorie meal plan low on processed crabs and simple sugars.  - Ongoing weight loss will improve insulin  resistance.    Hyperlipidemia associated with type 2 diabetes mellitus [facility]) Assessment & Plan: Lab Results  Component Value Date   CHOL 108 04/25/2023   HDL 53 04/25/2023   LDLCALC 41 04/25/2023   LDLDIRECT 37 10/05/2022   TRIG 61 04/25/2023   CHOLHDL 2.0 04/25/2023    On 05/14/2023, I decreased her Rosuvastatin  to 2.5 mg every other day; she is compliant and tolerant to this dose.   - Continue statin therapy and low cholesterol heart healthy meal plan.  - Increase exercise as able (see goal above).     Vitamin D  deficiency Assessment & Plan: Lab Results  Component Value Date   VD25OH 75.4 04/25/2023   VD25OH 87.7 10/05/2022   VD25OH 67.4 05/30/2022   She reports compliance with OTC Vit D 2,000 units daily. No acute concerns.   - Continue supplementation.  - Recheck levels as deemed  medically necessary.    Follow up:   Return 08/27/2023 at 8:40 AM.  She was informed of the importance of frequent follow up visits to maximize her success with intensive lifestyle modifications for her multiple health conditions.   Subjective:   Chief complaint: Obesity Vickie Nichols is here to discuss her progress with her obesity treatment plan. She is on the Category 2 Plan and keeping a food  journal and adhering to recommended goals of 1200-1300 calories and 80+ grams of protein.  Interval History:  Vickie Nichols is here for a follow up office visit. Since last OV on 06/21/2023 , she is up 6 lbs. States she is following her eating plan approximately 25% of the time. Endorses sometimes skipping meals. She went to the gym for about a week for 60 minutes since LOV. She has a beach trip at the end of August.    Pharmacotherapy that aid with weight loss: She is currently taking Bupropion  300 mg daily and Semaglutide  2 mg weekly.    Review of Systems:  Pertinent positives were addressed with patient today.  Reviewed by clinician on day of visit: allergies, medications, problem list, medical history, surgical history, family history, social history, and previous encounter notes.   Weight Summary and Biometrics   Weight Lost Since Last Visit: 0  Weight Gained Since Last Visit: 6 lb   Vitals Temp: 98.2 F (36.8 C) BP: 136/68 Pulse Rate: 71 SpO2: 99 %   Anthropometric Measurements Height: 5' 7 (1.702 m) Weight: 189 lb (85.7 kg) BMI (Calculated): 29.59 Weight at Last Visit: 183 lb Weight Lost Since Last Visit: 0 Weight Gained Since Last Visit: 6 lb Starting Weight: 219 lb Total Weight Loss (lbs): 30 lb (13.6 kg)   Body Composition  Body Fat %: 41.2 % Fat Mass (lbs): 78 lbs Muscle Mass (lbs): 105.6 lbs Total Body Water (lbs): 75.6 lbs Visceral Fat Rating : 11   Other Clinical Data Fasting: no Labs: no Today's Visit #: 39 Starting Date: 10/28/20 Comments: Cat 2    Objective:   PHYSICAL EXAM: Blood pressure 136/68, pulse 71, temperature 98.2 F (36.8 C), height 5' 7 (1.702 m), weight 189 lb (85.7 kg), SpO2 99%. Body mass index is 29.6 kg/m.  General: she is overweight, cooperative and in no acute distress. PSYCH: Has normal mood, affect and thought process.   HEENT: EOMI, sclerae are anicteric. Lungs: Normal breathing effort, no conversational  dyspnea. Extremities: Moves * 4 Neurologic: A and O * 3, good insight  DIAGNOSTIC DATA REVIEWED: BMET    Component Value Date/Time   NA 142 04/25/2023 1211   K 4.5 04/25/2023 1211   CL 103 04/25/2023 1211   CO2 24 04/25/2023 1211   GLUCOSE 79 04/25/2023 1211   GLUCOSE 132 (H) 10/01/2020 0810   BUN 8 04/25/2023 1211   CREATININE 0.89 04/25/2023 1211   CALCIUM  9.5 04/25/2023 1211   GFRNONAA 54 (L) 12/17/2017 2253   GFRAA >60 12/17/2017 2253   Lab Results  Component Value Date   HGBA1C 5.3 04/25/2023   HGBA1C 6.5 07/20/2010   Lab Results  Component Value Date   INSULIN  16.6 01/18/2022   INSULIN  19.5 10/28/2020   Lab Results  Component Value Date   TSH 1.400 10/28/2020   CBC    Component Value Date/Time   WBC 8.0 04/25/2023 1211   WBC 8.4 09/16/2020 0959   RBC 4.09 04/25/2023 1211   RBC 4.45 09/16/2020 0959   HGB 12.6 04/25/2023 1211   HGB 14.2  09/22/2011 1500   HCT 37.7 04/25/2023 1211   HCT 42.4 09/22/2011 1500   PLT 229 04/25/2023 1211   MCV 92 04/25/2023 1211   MCV 93 09/22/2011 1500   MCH 30.8 04/25/2023 1211   MCH 31.1 12/17/2017 2253   MCHC 33.4 04/25/2023 1211   MCHC 32.9 09/16/2020 0959   RDW 12.4 04/25/2023 1211   RDW 12.9 09/22/2011 1500   Iron Studies No results found for: IRON, TIBC, FERRITIN, IRONPCTSAT Lipid Panel     Component Value Date/Time   CHOL 108 04/25/2023 1211   TRIG 61 04/25/2023 1211   HDL 53 04/25/2023 1211   CHOLHDL 2.0 04/25/2023 1211   CHOLHDL 4 06/21/2018 0926   VLDL 25.2 06/21/2018 0926   LDLCALC 41 04/25/2023 1211   LDLDIRECT 37 10/05/2022 0943   LDLDIRECT 169.3 06/28/2012 0931   Hepatic Function Panel     Component Value Date/Time   PROT 6.6 04/25/2023 1211   ALBUMIN 4.4 04/25/2023 1211   AST 16 04/25/2023 1211   ALT 11 04/25/2023 1211   ALKPHOS 78 04/25/2023 1211   BILITOT 0.3 04/25/2023 1211   BILIDIR 0.1 04/02/2014 0852      Component Value Date/Time   TSH 1.400 10/28/2020 1003    Nutritional Lab Results  Component Value Date   VD25OH 75.4 04/25/2023   VD25OH 87.7 10/05/2022   VD25OH 67.4 05/30/2022    Attestations:   I, Special Puri, acting as a Stage manager for [employer], DO., have compiled all relevant documentation for today's office visit on behalf of Vickie Jenkins, DO, while in the presence of [employer], DO.  I have reviewed the above documentation for accuracy and completeness, and I agree with the above. Vickie Nichols, D.O.  The 21st Century Cures Act was signed into law in 2016 which includes the topic of electronic health records.  This provides immediate access to information in MyChart.  This includes consultation notes, operative notes, office notes, lab results and pathology reports.  If you have any questions about what you read please let us  know at your next visit so we can discuss your concerns and take corrective action if need be.  We are right here with you.

## 2023-08-27 ENCOUNTER — Encounter (INDEPENDENT_AMBULATORY_CARE_PROVIDER_SITE_OTHER): Payer: Self-pay | Admitting: Family Medicine

## 2023-08-27 ENCOUNTER — Ambulatory Visit (INDEPENDENT_AMBULATORY_CARE_PROVIDER_SITE_OTHER): Admitting: Family Medicine

## 2023-08-27 DIAGNOSIS — Z6828 Body mass index (BMI) 28.0-28.9, adult: Secondary | ICD-10-CM

## 2023-08-27 DIAGNOSIS — Z7985 Long-term (current) use of injectable non-insulin antidiabetic drugs: Secondary | ICD-10-CM

## 2023-08-27 DIAGNOSIS — E559 Vitamin D deficiency, unspecified: Secondary | ICD-10-CM | POA: Diagnosis not present

## 2023-08-27 DIAGNOSIS — E1169 Type 2 diabetes mellitus with other specified complication: Secondary | ICD-10-CM | POA: Diagnosis not present

## 2023-08-27 DIAGNOSIS — E785 Hyperlipidemia, unspecified: Secondary | ICD-10-CM

## 2023-08-27 DIAGNOSIS — K760 Fatty (change of) liver, not elsewhere classified: Secondary | ICD-10-CM | POA: Diagnosis not present

## 2023-08-27 DIAGNOSIS — F4320 Adjustment disorder, unspecified: Secondary | ICD-10-CM

## 2023-08-27 DIAGNOSIS — E669 Obesity, unspecified: Secondary | ICD-10-CM

## 2023-08-27 MED ORDER — VITAMIN D3 50 MCG (2000 UT) PO CAPS
2000.0000 [IU] | ORAL_CAPSULE | Freq: Every day | ORAL | Status: AC
Start: 2023-08-27 — End: ?

## 2023-08-29 NOTE — Progress Notes (Signed)
 Vickie Nichols, D.O.  ABFM, ABOM Specializing in Clinical Bariatric Medicine  Office located at: [address] [place], [place]  [address]    Assessment and Plan:   Medications Discontinued During This Encounter  Medication Reason   Cholecalciferol (VITAMIN D3) 50 MCG (2000 UT) capsule Reorder     Meds ordered this encounter  Medications   Cholecalciferol (VITAMIN D3) 50 MCG (2000 UT) capsule    Sig: Take 1 capsule (2,000 Units total) by mouth daily.      FOR THE DISEASE OF OBESITY:  BMI 28.0-28.9,adult - current BMI 28.5 Obesity, Beginning BMI 33.30 Assessment & Plan: Since last office visit on 07/23/2023 patient's muscle mass has decreased by 2.2 lbs. Fat mass has decreased by 4.2 lbs. Total body water has decreased by 3 lbs.  Body fat % has decreased by 0.8 %. Counseling done on how various foods will affect these numbers and how to maximize success  Total lbs lost to date: -37 lbs Total weight loss percentage to date: -16.89 %   Recommended Dietary Goals Caraline is currently in the action stage of change. As such, her goal is to continue weight management plan.  She has agreed to: {EMWTLOSSPLAN:29297::continue current plan}   Behavioral Intervention We discussed the following today: {dowtlossstrategies:31654}  Additional resources provided today: {DOhandouts:31655::None}  Evidence-based interventions for health behavior change were utilized today including the discussion of self monitoring techniques, problem-solving barriers and SMART goal setting techniques.   Regarding patient's less desirable eating habits and patterns, we employed the technique of small changes.   Pt will specifically work on: ***    Recommended Physical Activity Goals Tynslee has been advised to work up to 300-450 minutes of moderate intensity aerobic activity a week and strengthening exercises 2-3 times per week for cardiovascular health, weight loss maintenance and  preservation of muscle mass.   She has agreed to: {EMEXERCISE:28847::Think about enjoyable ways to increase daily physical activity and overcoming barriers to exercise,Increase physical activity in their day and reduce sedentary time (increase NEAT).,Increase volume of physical activity to a goal of 240 minutes a week,Combine aerobic and strengthening exercises for efficiency and improved cardiometabolic health.}   Pharmacotherapy We both agreed to: {EMagreedrx:29170}   ASSOCIATED CONDITIONS ADDRESSED TODAY:   Type 2 diabetes mellitus with morbid obesity ([facility]CC) Assessment & Plan: Lab Results  Component Value Date   HGBA1C 5.3 04/25/2023   HGBA1C 5.4 10/05/2022   HGBA1C 5.9 (H) 05/30/2022   INSULIN  16.6 01/18/2022   INSULIN  17.1 07/27/2021   INSULIN  24.5 02/21/2021        Hyperlipidemia associated with type 2 diabetes mellitus [facility]) Assessment & Plan: Lab Results  Component Value Date   CHOL 108 04/25/2023   HDL 53 04/25/2023   LDLCALC 41 04/25/2023   LDLDIRECT 37 10/05/2022   TRIG 61 04/25/2023   CHOLHDL 2.0 04/25/2023        Metabolic dysfunction-associated steatotic liver disease (MASLD) Assessment & Plan:    Component Value Date/Time   PROT 6.6 04/25/2023 1211   ALBUMIN 4.4 04/25/2023 1211   AST 16 04/25/2023 1211   ALT 11 04/25/2023 1211   ALKPHOS 78 04/25/2023 1211   BILITOT 0.3 04/25/2023 1211   BILIDIR 0.1 04/02/2014 0852          Vitamin D  deficiency Assessment & Plan: Lab Results  Component Value Date   VD25OH 75.4 04/25/2023   VD25OH 87.7 10/05/2022   VD25OH 67.4 05/30/2022      Grief reaction- emotional eating Assessment &  Plan:        Follow up:   Return 09/24/2023 10:40 AM.  She was informed of the importance of frequent follow up visits to maximize her success with intensive lifestyle modifications for her multiple health conditions.   Subjective:   Chief complaint: Obesity Corene is here to discuss her  progress with her obesity treatment plan. She is on the Category 2 Plan and keeping a food journal and adhering to recommended goals of 1200-1300 calories and 80+ grams of protein and states she is following her eating plan approximately 75-80% of the time. She states she is weight training 60 minutes 3 days per week.   Interval History:  [name] is here for a follow up office visit. Since last OV on 07/23/2023 , she is down 7 lbs.    Working with PT.       Pharmacotherapy that aid with weight loss: She is currently taking {EMPharmaco:28845}.    Review of Systems:  Pertinent positives were addressed with patient today.  Reviewed by clinician on day of visit: allergies, medications, problem list, medical history, surgical history, family history, social history, and previous encounter notes.  Weight Summary and Biometrics   No data recorded No data recorded   No data recorded No data recorded No data recorded No data recorded  Objective:   PHYSICAL EXAM: Blood pressure 118/68, pulse 72, temperature 98.3 F (36.8 C), height 5' 7 (1.702 m), weight 182 lb (82.6 kg), SpO2 98%. Body mass index is 28.51 kg/m.  General: she is overweight, cooperative and in no acute distress. PSYCH: Has normal mood, affect and thought process.   HEENT: EOMI, sclerae are anicteric. Lungs: Normal breathing effort, no conversational dyspnea. Extremities: Moves * 4 Neurologic: A and O * 3, good insight  DIAGNOSTIC DATA REVIEWED: BMET    Component Value Date/Time   NA 142 04/25/2023 1211   K 4.5 04/25/2023 1211   CL 103 04/25/2023 1211   CO2 24 04/25/2023 1211   GLUCOSE 79 04/25/2023 1211   GLUCOSE 132 (H) 10/01/2020 0810   BUN 8 04/25/2023 1211   CREATININE 0.89 04/25/2023 1211   CALCIUM  9.5 04/25/2023 1211   GFRNONAA 54 (L) 12/17/2017 2253   GFRAA >60 12/17/2017 2253   Lab Results  Component Value Date   HGBA1C 5.3 04/25/2023   HGBA1C 6.5 07/20/2010   Lab Results   Component Value Date   INSULIN  16.6 01/18/2022   INSULIN  19.5 10/28/2020   Lab Results  Component Value Date   TSH 1.400 10/28/2020   CBC    Component Value Date/Time   WBC 8.0 04/25/2023 1211   WBC 8.4 09/16/2020 0959   RBC 4.09 04/25/2023 1211   RBC 4.45 09/16/2020 0959   HGB 12.6 04/25/2023 1211   HGB 14.2 09/22/2011 1500   HCT 37.7 04/25/2023 1211   HCT 42.4 09/22/2011 1500   PLT 229 04/25/2023 1211   MCV 92 04/25/2023 1211   MCV 93 09/22/2011 1500   MCH 30.8 04/25/2023 1211   MCH 31.1 12/17/2017 2253   MCHC 33.4 04/25/2023 1211   MCHC 32.9 09/16/2020 0959   RDW 12.4 04/25/2023 1211   RDW 12.9 09/22/2011 1500   Iron Studies No results found for: IRON, TIBC, FERRITIN, IRONPCTSAT Lipid Panel     Component Value Date/Time   CHOL 108 04/25/2023 1211   TRIG 61 04/25/2023 1211   HDL 53 04/25/2023 1211   CHOLHDL 2.0 04/25/2023 1211   CHOLHDL 4 06/21/2018 0926   VLDL  25.2 06/21/2018 0926   LDLCALC 41 04/25/2023 1211   LDLDIRECT 37 10/05/2022 0943   LDLDIRECT 169.3 06/28/2012 0931   Hepatic Function Panel     Component Value Date/Time   PROT 6.6 04/25/2023 1211   ALBUMIN 4.4 04/25/2023 1211   AST 16 04/25/2023 1211   ALT 11 04/25/2023 1211   ALKPHOS 78 04/25/2023 1211   BILITOT 0.3 04/25/2023 1211   BILIDIR 0.1 04/02/2014 0852      Component Value Date/Time   TSH 1.400 10/28/2020 1003   Nutritional Lab Results  Component Value Date   VD25OH 75.4 04/25/2023   VD25OH 87.7 10/05/2022   VD25OH 67.4 05/30/2022    Attestations:   I, Special Puri, acting as a Stage manager for [employer], DO., have compiled all relevant documentation for today's office visit on behalf of Vickie Jenkins, DO, while in the presence of [employer], DO.  I have spent *** minutes in the care of the patient today including *** minutes face-to-face assessing and reviewing listed medical problems above as outlined in office visit note and providing nutritional  and behavioral counseling as outlined in obesity care plan.   I have reviewed the above documentation for accuracy and completeness, and I agree with the above. Vickie JINNY Nichols, D.O.  The 21st Century Cures Act was signed into law in 2016 which includes the topic of electronic health records.  This provides immediate access to information in MyChart.  This includes consultation notes, operative notes, office notes, lab results and pathology reports.  If you have any questions about what you read please let us  know at your next visit so we can discuss your concerns and take corrective action if need be.  We are right here with you.

## 2023-09-24 ENCOUNTER — Encounter (INDEPENDENT_AMBULATORY_CARE_PROVIDER_SITE_OTHER): Payer: Self-pay | Admitting: Family Medicine

## 2023-09-24 ENCOUNTER — Ambulatory Visit (INDEPENDENT_AMBULATORY_CARE_PROVIDER_SITE_OTHER): Admitting: Family Medicine

## 2023-09-24 VITALS — BP 133/72 | HR 64 | Temp 98.7°F | Ht 67.0 in | Wt 186.0 lb

## 2023-09-24 DIAGNOSIS — E669 Obesity, unspecified: Secondary | ICD-10-CM

## 2023-09-24 DIAGNOSIS — Z7985 Long-term (current) use of injectable non-insulin antidiabetic drugs: Secondary | ICD-10-CM

## 2023-09-24 DIAGNOSIS — E785 Hyperlipidemia, unspecified: Secondary | ICD-10-CM

## 2023-09-24 DIAGNOSIS — Z6828 Body mass index (BMI) 28.0-28.9, adult: Secondary | ICD-10-CM

## 2023-09-24 DIAGNOSIS — E559 Vitamin D deficiency, unspecified: Secondary | ICD-10-CM

## 2023-09-24 DIAGNOSIS — E1169 Type 2 diabetes mellitus with other specified complication: Secondary | ICD-10-CM | POA: Diagnosis not present

## 2023-09-24 DIAGNOSIS — Z6829 Body mass index (BMI) 29.0-29.9, adult: Secondary | ICD-10-CM

## 2023-09-24 NOTE — Progress Notes (Signed)
 Vickie Nichols, D.O.  ABFM, ABOM Specializing in Clinical Bariatric Medicine  Office located at: [address] [place], [place]  [address]   Assessment and Plan:   Orders Placed This Encounter  Procedures   Hemoglobin A1c   Lipid panel   Comprehensive metabolic panel with GFR   VITAMIN D  25 Hydroxy (Vit-D Deficiency, Fractures)    Will order labs today; she will come in 3-4 business days prior to her next OV to obtain them   FOR THE DISEASE OF OBESITY:  BMI 28.0-28.9,adult - current BMI 29.12 Obesity, Beginning BMI 33.30 Assessment & Plan: Since last office visit on 08/27/2023 patient's muscle mass has decreased by 0.2 lbs. Fat mass has increased by 4 lbs. Total body water has increased by 4.4 lbs.  Body fat % has increased by 1.3 %. Counseling done on how various foods will affect these numbers and how to maximize success  Total lbs lost to date: -33 lbs Total weight loss percentage to date: -15.07 %   Recommended Dietary Goals Jeweliana is currently in the action stage of change. As such, her goal is to continue weight management plan.  She has agreed to: continue current plan   Behavioral Intervention We discussed the following today: low calorie dressings for salads,  avoiding skipping meals, work on meal planning and preparation, work on tracking and journaling calories using tracking application, and continue to practice mindfulness when eating  Additional resources provided today: Handout on Daily Food Journaling Log  Evidence-based interventions for health behavior change were utilized today including the discussion of self monitoring techniques, problem-solving barriers and SMART goal setting techniques.   Regarding patient's less desirable eating habits and patterns, we employed the technique of small changes.   Goal(s) for next OV: journal over 50% of the time and bring in journaling  log to OV    Recommended Physical Activity Goals Vickie Nichols has  been advised to work up to 300-450 minutes of moderate intensity aerobic activity a week and strengthening exercises 2-3 times per week for cardiovascular health, weight loss maintenance and preservation of muscle mass.   She may Continue to gradually increase the amount and intensity of exercise routine   Pharmacotherapy Cont same regimen.   ASSOCIATED CONDITIONS ADDRESSED TODAY:   Type 2 diabetes mellitus with morbid obesity [facility]) Assessment & Plan: Lab Results  Component Value Date   HGBA1C 5.3 04/25/2023   HGBA1C 5.4 10/05/2022   HGBA1C 5.9 (H) 05/30/2022   INSULIN  16.6 01/18/2022   INSULIN  17.1 07/27/2021   INSULIN  24.5 02/21/2021    Denies symptoms of hypoglycemia or hyperglycemia. We d/c her Metformin  on 05/14/2023 as her A1c has been very well controlled. Currently on Semaglutide  2 mg wkly. Denies constipation; has loose stools about twice weekly which she attributes to eating off plan, rich foods. Overall hunger and cravings are controlled. She acknowledges sometimes reaching for foods when bored and as a reward. Overall, she feels this is not a major issue and declines Dr.Barker referral; will continue working on mindful eating. Continue PNP and GLP-1 therapy; reminded her that the more she eats off-plan, the more likely for her to have side effects of the drug.  Recheck A1c and CMP with GFR today.     Hyperlipidemia associated with type 2 diabetes mellitus [facility]) Assessment & Plan: Lab Results  Component Value Date   CHOL 108 04/25/2023   HDL 53 04/25/2023   LDLCALC 41 04/25/2023   LDLDIRECT 37 10/05/2022   TRIG 61  04/25/2023   CHOLHDL 2.0 04/25/2023   On 05/14/2023, I decreased her Rosuvastatin  from 2.5 mg daily  to 2.5 mg every other day; she is compliant and tolerant to this dose. Continue statin therapy and low cholesterol heart healthy meal plan. Increase exercise as able. Will order repeat lipid panel today.    Vitamin D  deficiency Assessment & Plan: Lab  Results  Component Value Date   VD25OH 75.4 04/25/2023   VD25OH 87.7 10/05/2022   VD25OH 67.4 05/30/2022   Currently on OTC Vit D 2,000 units daily with good compliance and tolerance. No acute concerns.  Vit D was at goal when checked about 5 months ago; will order repeat labs today. Continue supplementation.    Follow up:   Return 10/22/2023 at 10:40 AM.  She was informed of the importance of frequent follow up visits to maximize her success with intensive lifestyle modifications for her multiple health conditions.  SAARAH DEWING is aware that we will review all of her lab results at our next visit together in person.  She is aware that if anything is critical/ life threatening with the results, we will be contacting her via MyChart or by my CMA will be calling them prior to the office visit to discuss acute management.     Subjective:    Chief complaint: Obesity Vickie Nichols is here to discuss her progress with her obesity treatment plan. She is  keeping a food journal and adhering to recommended goals of 1250-1350 calories and 85+ grams of protein daily and states she is following her eating plan approximately 75% of the time. Pt is weight training 60 minutes 3 days per week   Interval History:  Vickie Nichols is here for a follow up office visit.   Pt has experienced a weight gain of 4 lbs since last OV on 08/27/2023.   Dietary and life style habits include:  - Uses meals from [employer].  - Tracking Calories/Macros: tracked when she could but had difficulties doing it especially during dinner time  - Eating More Whole Foods: yes  - Adequate Protein Intake: yes  - Adequate Water Intake: yes  - Skipping Meals: sometimes skips breakfast  - Sleeping 7-9 Hours/ Night: yes   Pharmacotherapy that aid with weight loss: She is currently taking Bupropion  XL 300 mg daily and Semaglutide  2 mg weekly   Review of Systems:  Pertinent positives were addressed with  patient today.  Reviewed by clinician on day of visit: allergies, medications, problem list, medical history, surgical history, family history, social history, and previous encounter notes.  Weight Summary and Biometrics   Weight Lost Since Last Visit: 0lb  Weight Gained Since Last Visit: 4lb   Vitals Temp: 98.7 F (37.1 C) BP: 133/72 Pulse Rate: 64 SpO2: 98 %   Anthropometric Measurements Height: 5' 7 (1.702 m) Weight: 186 lb (84.4 kg) BMI (Calculated): 29.12 Weight at Last Visit: 182lb Weight Lost Since Last Visit: 0lb Weight Gained Since Last Visit: 4lb Starting Weight: 219lb Total Weight Loss (lbs): 33 lb (15 kg)   Body Composition  Body Fat %: 41.7 % Fat Mass (lbs): 77.8 lbs Muscle Mass (lbs): 103.2 lbs Total Body Water (lbs): 77 lbs Visceral Fat Rating : 11   Other Clinical Data Fasting: No Labs: No Today's Visit #: 41 Starting Date: 10/28/20    Objective:   PHYSICAL EXAM: Blood pressure 133/72, pulse 64, temperature 98.7 F (37.1 C), height 5' 7 (1.702 m), weight 186 lb (84.4 kg),  SpO2 98%. Body mass index is 29.13 kg/m.  General: she is overweight, cooperative and in no acute distress. PSYCH: Has normal mood, affect and thought process.   HEENT: EOMI, sclerae are anicteric. Lungs: Normal breathing effort, no conversational dyspnea. Extremities: Moves * 4 Neurologic: A and O * 3, good insight  DIAGNOSTIC DATA REVIEWED: BMET    Component Value Date/Time   NA 142 04/25/2023 1211   K 4.5 04/25/2023 1211   CL 103 04/25/2023 1211   CO2 24 04/25/2023 1211   GLUCOSE 79 04/25/2023 1211   GLUCOSE 132 (H) 10/01/2020 0810   BUN 8 04/25/2023 1211   CREATININE 0.89 04/25/2023 1211   CALCIUM  9.5 04/25/2023 1211   GFRNONAA 54 (L) 12/17/2017 2253   GFRAA >60 12/17/2017 2253   Lab Results  Component Value Date   HGBA1C 5.3 04/25/2023   HGBA1C 6.5 07/20/2010   Lab Results  Component Value Date   INSULIN  16.6 01/18/2022   INSULIN  19.5  10/28/2020   Lab Results  Component Value Date   TSH 1.400 10/28/2020   CBC    Component Value Date/Time   WBC 8.0 04/25/2023 1211   WBC 8.4 09/16/2020 0959   RBC 4.09 04/25/2023 1211   RBC 4.45 09/16/2020 0959   HGB 12.6 04/25/2023 1211   HGB 14.2 09/22/2011 1500   HCT 37.7 04/25/2023 1211   HCT 42.4 09/22/2011 1500   PLT 229 04/25/2023 1211   MCV 92 04/25/2023 1211   MCV 93 09/22/2011 1500   MCH 30.8 04/25/2023 1211   MCH 31.1 12/17/2017 2253   MCHC 33.4 04/25/2023 1211   MCHC 32.9 09/16/2020 0959   RDW 12.4 04/25/2023 1211   RDW 12.9 09/22/2011 1500   Iron Studies No results found for: IRON, TIBC, FERRITIN, IRONPCTSAT Lipid Panel     Component Value Date/Time   CHOL 108 04/25/2023 1211   TRIG 61 04/25/2023 1211   HDL 53 04/25/2023 1211   CHOLHDL 2.0 04/25/2023 1211   CHOLHDL 4 06/21/2018 0926   VLDL 25.2 06/21/2018 0926   LDLCALC 41 04/25/2023 1211   LDLDIRECT 37 10/05/2022 0943   LDLDIRECT 169.3 06/28/2012 0931   Hepatic Function Panel     Component Value Date/Time   PROT 6.6 04/25/2023 1211   ALBUMIN 4.4 04/25/2023 1211   AST 16 04/25/2023 1211   ALT 11 04/25/2023 1211   ALKPHOS 78 04/25/2023 1211   BILITOT 0.3 04/25/2023 1211   BILIDIR 0.1 04/02/2014 0852      Component Value Date/Time   TSH 1.400 10/28/2020 1003   Nutritional Lab Results  Component Value Date   VD25OH 75.4 04/25/2023   VD25OH 87.7 10/05/2022   VD25OH 67.4 05/30/2022    Attestations:   I, Special Puri, acting as a Stage manager for [employer], DO., have compiled all relevant documentation for today's office visit on behalf of Vickie Jenkins, DO, while in the presence of [employer], DO.  I have reviewed the above documentation for accuracy and completeness, and I agree with the above. Vickie JINNY Nichols, D.O.  The 21st Century Cures Act was signed into law in 2016 which includes the topic of electronic health records.  This provides immediate access to  information in MyChart.  This includes consultation notes, operative notes, office notes, lab results and pathology reports.  If you have any questions about what you read please let us  know at your next visit so we can discuss your concerns and take corrective action if need be.  We are  right here with you.

## 2023-10-18 LAB — COMPREHENSIVE METABOLIC PANEL WITH GFR
ALT: 14 IU/L (ref 0–32)
AST: 22 IU/L (ref 0–40)
Albumin: 4.4 g/dL (ref 3.9–4.9)
Alkaline Phosphatase: 85 IU/L (ref 49–135)
BUN/Creatinine Ratio: 13 (ref 12–28)
BUN: 12 mg/dL (ref 8–27)
Bilirubin Total: 0.4 mg/dL (ref 0.0–1.2)
CO2: 25 mmol/L (ref 20–29)
Calcium: 9.6 mg/dL (ref 8.7–10.3)
Chloride: 105 mmol/L (ref 96–106)
Creatinine, Ser: 0.93 mg/dL (ref 0.57–1.00)
Globulin, Total: 2.1 g/dL (ref 1.5–4.5)
Glucose: 81 mg/dL (ref 70–99)
Potassium: 4.3 mmol/L (ref 3.5–5.2)
Sodium: 145 mmol/L — ABNORMAL HIGH (ref 134–144)
Total Protein: 6.5 g/dL (ref 6.0–8.5)
eGFR: 67 mL/min/1.73 (ref >59)

## 2023-10-18 LAB — HEMOGLOBIN A1C
Est. average glucose Bld gHb Est-mCnc: 111 mg/dL
Hgb A1c MFr Bld: 5.5 % (ref 4.8–5.6)

## 2023-10-18 LAB — LIPID PANEL
Chol/HDL Ratio: 2.4 ratio (ref 0.0–4.4)
Cholesterol, Total: 134 mg/dL (ref 100–199)
HDL: 56 mg/dL (ref >39)
LDL Chol Calc (NIH): 63 mg/dL (ref 0–99)
Triglycerides: 74 mg/dL (ref 0–149)
VLDL Cholesterol Cal: 15 mg/dL (ref 5–40)

## 2023-10-18 LAB — VITAMIN D 25 HYDROXY (VIT D DEFICIENCY, FRACTURES): Vit D, 25-Hydroxy: 31.7 ng/mL (ref 30.0–100.0)

## 2023-10-22 ENCOUNTER — Ambulatory Visit (INDEPENDENT_AMBULATORY_CARE_PROVIDER_SITE_OTHER): Admitting: Family Medicine

## 2023-10-22 ENCOUNTER — Encounter (INDEPENDENT_AMBULATORY_CARE_PROVIDER_SITE_OTHER): Payer: Self-pay

## 2023-11-11 ENCOUNTER — Other Ambulatory Visit: Payer: Self-pay | Admitting: Family Medicine

## 2023-11-11 DIAGNOSIS — F320 Major depressive disorder, single episode, mild: Secondary | ICD-10-CM

## 2023-11-26 ENCOUNTER — Ambulatory Visit (INDEPENDENT_AMBULATORY_CARE_PROVIDER_SITE_OTHER): Admitting: Family Medicine

## 2023-12-03 ENCOUNTER — Ambulatory Visit (INDEPENDENT_AMBULATORY_CARE_PROVIDER_SITE_OTHER): Payer: Self-pay | Admitting: Nurse Practitioner

## 2023-12-13 ENCOUNTER — Encounter (INDEPENDENT_AMBULATORY_CARE_PROVIDER_SITE_OTHER): Payer: Self-pay | Admitting: Family Medicine

## 2023-12-13 ENCOUNTER — Ambulatory Visit (INDEPENDENT_AMBULATORY_CARE_PROVIDER_SITE_OTHER): Admitting: Family Medicine

## 2023-12-13 VITALS — BP 154/71 | HR 79 | Temp 97.8°F | Ht 67.0 in | Wt 182.0 lb

## 2023-12-13 DIAGNOSIS — E1169 Type 2 diabetes mellitus with other specified complication: Secondary | ICD-10-CM

## 2023-12-13 DIAGNOSIS — Z7985 Long-term (current) use of injectable non-insulin antidiabetic drugs: Secondary | ICD-10-CM | POA: Diagnosis not present

## 2023-12-13 DIAGNOSIS — E559 Vitamin D deficiency, unspecified: Secondary | ICD-10-CM | POA: Diagnosis not present

## 2023-12-13 DIAGNOSIS — R030 Elevated blood-pressure reading, without diagnosis of hypertension: Secondary | ICD-10-CM | POA: Diagnosis not present

## 2023-12-13 DIAGNOSIS — E669 Obesity, unspecified: Secondary | ICD-10-CM

## 2023-12-13 DIAGNOSIS — Z6828 Body mass index (BMI) 28.0-28.9, adult: Secondary | ICD-10-CM

## 2023-12-13 DIAGNOSIS — E785 Hyperlipidemia, unspecified: Secondary | ICD-10-CM | POA: Diagnosis not present

## 2023-12-13 MED ORDER — VITAMIN D3 50 MCG (2000 UT) PO CAPS: 2000.0000 [IU] | ORAL_CAPSULE | Freq: Every day | ORAL | Status: AC

## 2023-12-13 NOTE — Progress Notes (Signed)
 Vickie Nichols, D.O.  ABFM, ABOM Specializing in Clinical Bariatric Medicine  Office located at: [address] [place], [place]  [address]      A) FOR THE CHRONIC DISEASE OF OBESITY:  Chief complaint: Obesity Vickie Nichols is here to discuss her progress with her obesity treatment plan.   History of present illness / Interval history:  Vickie Nichols is here today for her follow-up office visit.  Since last OV on 09/24/23, pt is down 4 lbs . Patient states that it has been a hard couple of weeks and states that it will be the first Christmas without her mom. She reports that she threw up at the gym the last time she worked out with her trainer and has not been back.    09/24/23 12/13/23   Body Fat % 41.7 % 40.6 %  Muscle Mass (lbs) 103.2 lbs 103 lbs  Fat Mass (lbs) 77.8 lbs 74 lbs  Total Body Water (lbs) 77 lbs 74.6 lbs  Visceral Fat Rating  11 11    Counseling done on how various foods will affect these numbers and how to maximize success.  Total lbs lost to date: - 37 lbs Total Fat Mass in lbs lost to date: - 22 lbs Total weight loss percentage to date: - 16.89 %    Obesity, Beginning BMI 33.30 BMI 28.0-28.9,adult - current BMI 28.5  Nutrition Therapy She is journaling 1250-1350 cal and 85+ grams protein daily with the CAT 2 MP as a guide and states she is following her eating plan approximately 33 % of the time.   - Tracking Calories/Macros: no  - Eating More Whole Foods: no   - Adequate Protein Intake: no   - Adequate Water Intake: yes  - Skipping Meals: yes  - Sleeping 7-9 Hours/ Night: yes   [name] is currently in the action stage of change. As such, her goal is to continue weight management plan.  She has agreed to: continue current plan   Physical Activity [name] is not exercising.   [name] has been advised to work up to 300-450 minutes of moderate intensity aerobic activity a week and strengthening exercises 2-3 times per week for  cardiovascular health, weight loss maintenance and preservation of muscle mass.  She has agreed to : Think about enjoyable ways to increase daily physical activity and overcoming barriers to exercise and Increase volume of physical activity to a goal of 240 minutes a week   Behavioral Modifications Evidence-based interventions for health behavior change were utilized today including the discussion of  1) self monitoring techniques:    - Maintain weight over the holidays  2) problem-solving barriers:    - Set boundaries with loved ones  3) self care:    - Reach out and think about getting a counselor  4) SMART goals for next OV:    - Go to the gym 1 hour two times a week   Regarding patient's less desirable eating habits and patterns, we employed the technique of small changes.   We discussed the following today: increasing lean protein intake to established goals and continue to work on maintaining a reduced calorie state, getting the recommended amount of protein, incorporating whole foods, making healthy choices, staying well hydrated and practicing mindfulness when eating. Additional resources provided today: None   Medical Interventions/ Pharmacotherapy Previous Bariatric surgery: n/a Pharmacotherapy for weight loss: She is currently taking Semaglutide  2 mg weekly for medical weight loss.    We discussed various  medication options to help Vickie Nichols with her weight loss efforts and we both agreed to : Adequate clinical response to anti-obesity medication, continue current regimen   B) OBESITY RELATED CONDITIONS ADDRESSED TODAY:  Type 2 diabetes mellitus with morbid obesity [facility]) Assessment & Plan Lab Results  Component Value Date   HGBA1C 5.5 10/17/2023   HGBA1C 5.3 04/25/2023   HGBA1C 5.4 10/05/2022   INSULIN  16.6 01/18/2022   INSULIN  17.1 07/27/2021   INSULIN  24.5 02/21/2021    On Ozempic  2 mg weekly. Patient states that she is starting to dislike being on this  medication because she dislikes injecting herself. Her A1C is well controlled at 5.5 but has increased from her previous labs from 5.3 to 5.5.  Explained to patient what to expect if she were to go off of Ozempic . No complaints of excessive hunger or cravings. Continue following prudent meal plan and decreasing simple carbs and sugars.     Hyperlipidemia associated with type 2 diabetes mellitus [facility]) Assessment & Plan Lab Results  Component Value Date   CHOL 134 10/17/2023   HDL 56 10/17/2023   LDLCALC 63 10/17/2023   LDLDIRECT 37 10/05/2022   TRIG 74 10/17/2023   CHOLHDL 2.4 10/17/2023    On Crestor  2.5 mg daily. With reported good compliance and tolerance. Patient states that she has no issues with this medication. Patients HDL levels have increased from 53 to 56. Continue with medication.     Vitamin D  deficiency Assessment & Plan Lab Results  Component Value Date   VD25OH 31.7 10/17/2023   VD25OH 75.4 04/25/2023   VD25OH 87.7 10/05/2022   Patient states that she has not been taking any Vit D supplementation. Since April of this year. Vit D levels are not at goal. Goal levels are between 50 and 70. Begin OTC VIT D supplementation 2000 units daily. Will recheck levels in 3-4 months.      Elevated blood pressure reading Assessment & Plan BP Readings from Last 3 Encounters:  12/13/23 (!) 154/71  09/24/23 133/72  08/27/23 118/68   The 10-year ASCVD risk score (Arnett DK, et al., 2019) is: 17.1%  Lab Results  Component Value Date   CREATININE 0.93 10/17/2023   Patient BP today is a little elevated. She is currently not on any medications for BP. She states that every time she comes into the office she has an elevated BP. Recommended that patient work on stress relief and increase exercise. Will reassess at next OV.     Medications Discontinued During This Encounter  Medication Reason   Cholecalciferol (VITAMIN D3) 50 MCG (2000 UT) capsule Reorder     Meds ordered  this encounter  Medications   Cholecalciferol (VITAMIN D3) 50 MCG (2000 UT) capsule    Sig: Take 1 capsule (2,000 Units total) by mouth daily.     Follow up:   Return 01/07/2024 at 3:00 PM  She was informed of the importance of frequent follow up visits to maximize her success with intensive lifestyle modifications for her multiple health conditions.   Weight Summary and Biometrics   Weight Lost Since Last Visit: 4lb  Weight Gained Since Last Visit: 0lb   Vitals Temp: 97.8 F (36.6 C) BP: (!) 154/71 Pulse Rate: 79 SpO2: 99 %   Anthropometric Measurements Height: 5' 7 (1.702 m) Weight: 182 lb (82.6 kg) BMI (Calculated): 28.5 Weight at Last Visit: 186lb Weight Lost Since Last Visit: 4lb Weight Gained Since Last Visit: 0lb Starting Weight: 219lb Total Weight Loss (lbs): 37  lb (16.8 kg)   Body Composition  Body Fat %: 40.6 % Fat Mass (lbs): 74 lbs Muscle Mass (lbs): 103 lbs Total Body Water (lbs): 74.6 lbs Visceral Fat Rating : 11   Other Clinical Data Fasting: no Labs: no Today's Visit #: 42 Starting Date: 10/28/20    Objective:   PHYSICAL EXAM: Blood pressure (!) 154/71, pulse 79, temperature 97.8 F (36.6 C), height 5' 7 (1.702 m), weight 182 lb (82.6 kg), SpO2 99%. Body mass index is 28.51 kg/m.  General: she is overweight, cooperative and in no acute distress. PSYCH: Has normal mood, affect and thought process.   HEENT: EOMI, sclerae are anicteric. Lungs: Normal breathing effort, no conversational dyspnea. Extremities: Moves * 4 Neurologic: A and O * 3, good insight  DIAGNOSTIC DATA REVIEWED: BMET    Component Value Date/Time   NA 145 (H) 10/17/2023 0801   K 4.3 10/17/2023 0801   CL 105 10/17/2023 0801   CO2 25 10/17/2023 0801   GLUCOSE 81 10/17/2023 0801   GLUCOSE 132 (H) 10/01/2020 0810   BUN 12 10/17/2023 0801   CREATININE 0.93 10/17/2023 0801   CALCIUM  9.6 10/17/2023 0801   GFRNONAA 54 (L) 12/17/2017 2253   GFRAA >60 12/17/2017  2253   Lab Results  Component Value Date   HGBA1C 5.5 10/17/2023   HGBA1C 6.5 07/20/2010   Lab Results  Component Value Date   INSULIN  16.6 01/18/2022   INSULIN  19.5 10/28/2020   Lab Results  Component Value Date   TSH 1.400 10/28/2020   CBC    Component Value Date/Time   WBC 8.0 04/25/2023 1211   WBC 8.4 09/16/2020 0959   RBC 4.09 04/25/2023 1211   RBC 4.45 09/16/2020 0959   HGB 12.6 04/25/2023 1211   HGB 14.2 09/22/2011 1500   HCT 37.7 04/25/2023 1211   HCT 42.4 09/22/2011 1500   PLT 229 04/25/2023 1211   MCV 92 04/25/2023 1211   MCV 93 09/22/2011 1500   MCH 30.8 04/25/2023 1211   MCH 31.1 12/17/2017 2253   MCHC 33.4 04/25/2023 1211   MCHC 32.9 09/16/2020 0959   RDW 12.4 04/25/2023 1211   RDW 12.9 09/22/2011 1500   Iron Studies No results found for: IRON, TIBC, FERRITIN, IRONPCTSAT Lipid Panel     Component Value Date/Time   CHOL 134 10/17/2023 0801   TRIG 74 10/17/2023 0801   HDL 56 10/17/2023 0801   CHOLHDL 2.4 10/17/2023 0801   CHOLHDL 4 06/21/2018 0926   VLDL 25.2 06/21/2018 0926   LDLCALC 63 10/17/2023 0801   LDLDIRECT 37 10/05/2022 0943   LDLDIRECT 169.3 06/28/2012 0931   Hepatic Function Panel     Component Value Date/Time   PROT 6.5 10/17/2023 0801   ALBUMIN 4.4 10/17/2023 0801   AST 22 10/17/2023 0801   ALT 14 10/17/2023 0801   ALKPHOS 85 10/17/2023 0801   BILITOT 0.4 10/17/2023 0801   BILIDIR 0.1 04/02/2014 0852      Component Value Date/Time   TSH 1.400 10/28/2020 1003   Nutritional Lab Results  Component Value Date   VD25OH 31.7 10/17/2023   VD25OH 75.4 04/25/2023   VD25OH 87.7 10/05/2022    Attestations:   I, Sonny Laroche, acting as a stage manager for Vickie Jenkins, DO., have compiled all relevant documentation for today's office visit on behalf of Vickie Jenkins, DO, while in the presence of [employer], DO.   I have reviewed the above documentation for accuracy and completeness, and I agree with the  above.  Vickie JINNY Nichols, D.O.  The 21st Century Cures Act was signed into law in 2016 which includes the topic of electronic health records.  This provides immediate access to information in MyChart.  This includes consultation notes, operative notes, office notes, lab results and pathology reports.  If you have any questions about what you read please let us  know at your next visit so we can discuss your concerns and take corrective action if need be.  We are right here with you.

## 2024-01-07 ENCOUNTER — Ambulatory Visit (INDEPENDENT_AMBULATORY_CARE_PROVIDER_SITE_OTHER): Admitting: Family Medicine

## 2024-01-07 ENCOUNTER — Encounter (INDEPENDENT_AMBULATORY_CARE_PROVIDER_SITE_OTHER): Payer: Self-pay | Admitting: Family Medicine

## 2024-01-07 VITALS — BP 133/94 | HR 68 | Temp 98.2°F | Ht 67.0 in | Wt 188.0 lb

## 2024-01-07 DIAGNOSIS — Z6829 Body mass index (BMI) 29.0-29.9, adult: Secondary | ICD-10-CM | POA: Diagnosis not present

## 2024-01-07 DIAGNOSIS — Z6828 Body mass index (BMI) 28.0-28.9, adult: Secondary | ICD-10-CM

## 2024-01-07 DIAGNOSIS — E559 Vitamin D deficiency, unspecified: Secondary | ICD-10-CM

## 2024-01-07 DIAGNOSIS — R0683 Snoring: Secondary | ICD-10-CM

## 2024-01-07 DIAGNOSIS — E1169 Type 2 diabetes mellitus with other specified complication: Secondary | ICD-10-CM

## 2024-01-07 DIAGNOSIS — E669 Obesity, unspecified: Secondary | ICD-10-CM

## 2024-01-07 DIAGNOSIS — E785 Hyperlipidemia, unspecified: Secondary | ICD-10-CM

## 2024-01-07 DIAGNOSIS — F4320 Adjustment disorder, unspecified: Secondary | ICD-10-CM | POA: Diagnosis not present

## 2024-01-07 DIAGNOSIS — Z7985 Long-term (current) use of injectable non-insulin antidiabetic drugs: Secondary | ICD-10-CM

## 2024-01-07 MED ORDER — ROSUVASTATIN CALCIUM 5 MG PO TABS: ORAL_TABLET | ORAL | 0 refills | Status: AC

## 2024-01-07 NOTE — Progress Notes (Signed)
 "  Vickie DOROTHA Nichols, D.O.  ABFM, ABOM Specializing in Clinical Bariatric Medicine  Office located at: [address] [place], [place]  [address]      A) FOR THE CHRONIC DISEASE OF OBESITY:  Obesity, Beginning BMI 33.30 BMI 28.0-28.9,adult - current BMI 29.44   Chief complaint: Obesity Vickie Nichols is here to discuss her progress with her obesity treatment plan.   History of present illness / Interval history:  Vickie Nichols is here today for her follow-up office visit.  Since last OV on 12/13/2023, pt is up 6 lbs.    12/13/23 01/07/24   Body Fat % 40.6 % 41.7 %  Muscle Mass (lbs) 103 lbs 104 lbs  Fat Mass (lbs) 74 lbs 78.4 lbs  Total Body Water (lbs) 74.6 lbs 76.6 lbs  Visceral Fat Rating  11 11  Counseling done on how various foods will affect these numbers and how to maximize success   Total lbs lost to date: -31 lbs Total Fat Mass in lbs lost to date: -17.6 Total weight loss percentage to date: -14.16 %   Nutrition Therapy She is journaling 1250-1350 cal and 85+ grams protein daily with the CAT 2 MP as a guide  and states she is following her eating plan approximately 0 % of the time.   - Tracking Calories/Macros: no  - Eating More Whole Foods: yes  - Adequate Protein Intake: no -    - Adequate Water Intake: yes  - Skipping Meals: yes; mostly skips breakfast  - Sleeping 7-9 Hours/ Night: yes   Vickie Nichols is currently in the action stage of change. As such, her goal is to continue weight management plan.  She has agreed to: continue current plan   Physical Activity Pt is not exercising.   Vickie Nichols has been advised to work up to 300-450 minutes of moderate intensity aerobic activity a week and strengthening exercises 2-3 times per week for cardiovascular health, weight loss maintenance and preservation of muscle mass.  She has agreed to : Think about enjoyable ways to increase daily physical activity and overcoming barriers to exercise and Increase  physical activity in their day and reduce sedentary time (increase NEAT).   Behavioral Modifications Evidence-based interventions for health behavior change were utilized today including the discussion of  1) self monitoring techniques:  journaling 2) problem-solving barriers:  n/a 3) self care:  exercise 4) SMART goals for next OV:  Increase exercise to 3 days per week. Regarding patient's less desirable eating habits and patterns, we employed the technique of small changes.   We discussed the following today: increasing lean protein intake to established goals, avoiding skipping meals, keeping healthy foods at home, avoiding temptations and identifying enticing environmental cues, continue to work on implementation of reduced calorie nutritional plan, and discussed pre-packaged healthier meals such as [employer] Natural, Whole 30 chicken meals, Longlife meal prep and Factor Meals etc Additional resources provided today: Handout on CAT 2 meal plan   Medical Interventions/ Pharmacotherapy Previous Bariatric surgery: none Pharmacotherapy for weight loss: She is currently taking Semaglutide  2 mg weekly and Wellbutrin  XL 300 mg once daily for medical weight loss.    We discussed various medication options to help Vickie Nichols with her weight loss efforts and we both agreed to : Continue with current nutritional and behavioral strategies   B) OBESITY RELATED CONDITIONS ADDRESSED TODAY:  Type 2 diabetes mellitus with morbid obesity [facility]) Assessment & Plan Lab Results  Component Value Date   HGBA1C 5.5 10/17/2023  HGBA1C 5.3 04/25/2023   HGBA1C 5.4 10/05/2022   INSULIN  16.6 01/18/2022   INSULIN  17.1 07/27/2021   INSULIN  24.5 02/21/2021  Currently on Semaglutide  2 mg once weekly with good compliance and tolerance. Hunger and cravings are well controlled. A1c is well controlled. No acute concerns. Cont decreasing simple carbs/sugars and increasing lean protein. Increase exercise as  able.    Hyperlipidemia associated with type 2 diabetes mellitus Vickie Nichols) Assessment & Plan Lab Results  Component Value Date   CHOL 134 10/17/2023   HDL 56 10/17/2023   LDLCALC 63 10/17/2023   LDLDIRECT 37 10/05/2022   TRIG 74 10/17/2023   CHOLHDL 2.4 10/17/2023  Currently on Crestor  2.5 mg daily with good compliance and tolerance. HDL, LDL, and Trigs are WNL. No acute concerns today. Cont regimen(refill today). Cont decreasing saturated/trans fats. Increase exercise as able.     Vitamin D  deficiency Assessment & Plan Lab Results  Component Value Date   VD25OH 31.7 10/17/2023   VD25OH 75.4 04/25/2023   VD25OH 87.7 10/05/2022  Currently taking OTC Vit D supplementation 2,000 units daily with good compliance and tolerance. Vit D levels are not at goal at 31.7; optimal 50-70. Cont adherence to supplementation. Will recheck levels in 3-4 months.    Grief reaction Assessment & Plan Currently on Wellbutrin  XL 300 mg once daily with good compliance and tolerance. Pt reports this has been her first christmas without her mother around which has been difficult and therefore has been skipping more meals. Encouraged pt to implement self-care strategies and increase exercise to help manage stress.     Snoring Assessment & Plan Pt's daughter who is an [employer] in the ER had concerns her mom had sleep apnea. Vickie Nichols does not think this is the case. I personally performed the Epworth sleepiness questionnaire on pt and she scored a 3. Pt did not have concerns. Explained to pt what the score means. We believe she hasn't been sleeping well due to stress and grief. No acute concerns today. Encouraged to implement self-care strategies to help manage stress and grief for adequate sleep.      Medications Discontinued During This Encounter  Medication Reason   rosuvastatin  (CRESTOR ) 5 MG tablet Reorder     Meds ordered this encounter  Medications   rosuvastatin  (CRESTOR ) 5 MG tablet    Sig: 0.5  tabs every night    Dispense:  45 tablet    Refill:  0      Follow up:   Return 02/04/2024 9:20 AM.  She was informed of the importance of frequent follow up visits to maximize her success with intensive lifestyle modifications for her multiple health conditions.   Weight Summary and Biometrics   Weight Lost Since Last Visit: 0lb  Weight Gained Since Last Visit: 6lb    Vitals Temp: 98.2 F (36.8 C) BP: (!) 133/94 Pulse Rate: 68 SpO2: 99 %   Anthropometric Measurements Height: 5' 7 (1.702 m) Weight: 188 lb (85.3 kg) BMI (Calculated): 29.44 Weight at Last Visit: 182lb Weight Lost Since Last Visit: 0lb Weight Gained Since Last Visit: 6lb Starting Weight: 219lb Total Weight Loss (lbs): 31 lb (14.1 kg)   Body Composition  Body Fat %: 41.7 % Fat Mass (lbs): 78.4 lbs Muscle Mass (lbs): 104 lbs Total Body Water (lbs): 76.6 lbs Visceral Fat Rating : 11   Other Clinical Data Fasting: no Labs: no Today's Visit #: 43 Starting Date: 10/28/20    Objective:   PHYSICAL EXAM: Blood pressure (!) 133/94, pulse  68, temperature 98.2 F (36.8 C), height 5' 7 (1.702 m), weight 188 lb (85.3 kg), SpO2 99%. Body mass index is 29.44 kg/m.  General: she is overweight, cooperative and in no acute distress. PSYCH: Has normal mood, affect and thought process.   HEENT: EOMI, sclerae are anicteric. Lungs: Normal breathing effort, no conversational dyspnea. Extremities: Moves * 4 Neurologic: A and O * 3, good insight  DIAGNOSTIC DATA REVIEWED: BMET    Component Value Date/Time   NA 145 (H) 10/17/2023 0801   K 4.3 10/17/2023 0801   CL 105 10/17/2023 0801   CO2 25 10/17/2023 0801   GLUCOSE 81 10/17/2023 0801   GLUCOSE 132 (H) 10/01/2020 0810   BUN 12 10/17/2023 0801   CREATININE 0.93 10/17/2023 0801   CALCIUM  9.6 10/17/2023 0801   GFRNONAA 54 (L) 12/17/2017 2253   GFRAA >60 12/17/2017 2253   Lab Results  Component Value Date   HGBA1C 5.5 10/17/2023   HGBA1C 6.5  07/20/2010   Lab Results  Component Value Date   INSULIN  16.6 01/18/2022   INSULIN  19.5 10/28/2020   Lab Results  Component Value Date   TSH 1.400 10/28/2020   CBC    Component Value Date/Time   WBC 8.0 04/25/2023 1211   WBC 8.4 09/16/2020 0959   RBC 4.09 04/25/2023 1211   RBC 4.45 09/16/2020 0959   HGB 12.6 04/25/2023 1211   HGB 14.2 09/22/2011 1500   HCT 37.7 04/25/2023 1211   HCT 42.4 09/22/2011 1500   PLT 229 04/25/2023 1211   MCV 92 04/25/2023 1211   MCV 93 09/22/2011 1500   MCH 30.8 04/25/2023 1211   MCH 31.1 12/17/2017 2253   MCHC 33.4 04/25/2023 1211   MCHC 32.9 09/16/2020 0959   RDW 12.4 04/25/2023 1211   RDW 12.9 09/22/2011 1500   Iron Studies No results found for: IRON, TIBC, FERRITIN, IRONPCTSAT Lipid Panel     Component Value Date/Time   CHOL 134 10/17/2023 0801   TRIG 74 10/17/2023 0801   HDL 56 10/17/2023 0801   CHOLHDL 2.4 10/17/2023 0801   CHOLHDL 4 06/21/2018 0926   VLDL 25.2 06/21/2018 0926   LDLCALC 63 10/17/2023 0801   LDLDIRECT 37 10/05/2022 0943   LDLDIRECT 169.3 06/28/2012 0931   Hepatic Function Panel     Component Value Date/Time   PROT 6.5 10/17/2023 0801   ALBUMIN 4.4 10/17/2023 0801   AST 22 10/17/2023 0801   ALT 14 10/17/2023 0801   ALKPHOS 85 10/17/2023 0801   BILITOT 0.4 10/17/2023 0801   BILIDIR 0.1 04/02/2014 0852      Component Value Date/Time   TSH 1.400 10/28/2020 1003   Nutritional Lab Results  Component Value Date   VD25OH 31.7 10/17/2023   VD25OH 75.4 04/25/2023   VD25OH 87.7 10/05/2022    Attestations:   I, Feliciano Mingle, acting as a stage manager for Vickie Jenkins, DO., have compiled all relevant documentation for today's office visit on behalf of Vickie Jenkins, DO, while in the presence of [employer], DO.    I have reviewed the above documentation for accuracy and completeness, and I agree with the above. Vickie Nichols, D.O.  The 21st Century Cures Act was signed into law in  2016 which includes the topic of electronic health records.  This provides immediate access to information in MyChart.  This includes consultation notes, operative notes, office notes, lab results and pathology reports.  If you have any questions about what you read please let us  know at  your next visit so we can discuss your concerns and take corrective action if need be.  We are right here with you.  "

## 2024-02-04 ENCOUNTER — Ambulatory Visit (INDEPENDENT_AMBULATORY_CARE_PROVIDER_SITE_OTHER): Admitting: Family Medicine

## 2024-02-08 ENCOUNTER — Telehealth: Payer: Self-pay | Admitting: *Deleted

## 2024-02-08 ENCOUNTER — Other Ambulatory Visit: Payer: Self-pay | Admitting: Family Medicine

## 2024-02-08 NOTE — Telephone Encounter (Signed)
 Pt is due for Medicare AWV. Vickie Nichols has appt with PCP on 2/26 at 1:20. If pt is agreeable to do AWV, would Vickie Nichols be able to do the AWV at 1 on 2/26 and see Dr Antonio Meth at 1:40?  Left message for pt to return my call.

## 2024-02-28 ENCOUNTER — Ambulatory Visit: Admitting: Family Medicine

## 2024-03-05 ENCOUNTER — Ambulatory Visit (INDEPENDENT_AMBULATORY_CARE_PROVIDER_SITE_OTHER): Admitting: Family Medicine
